# Patient Record
Sex: Female | Born: 1970
Health system: Southern US, Community
[De-identification: ages and names within clinical notes are randomized; demographics above are authoritative.]

## PROBLEM LIST (undated history)

## (undated) DIAGNOSIS — D869 Sarcoidosis, unspecified: Secondary | ICD-10-CM

## (undated) DIAGNOSIS — R911 Solitary pulmonary nodule: Secondary | ICD-10-CM

## (undated) DIAGNOSIS — I10 Essential (primary) hypertension: Secondary | ICD-10-CM

## (undated) DIAGNOSIS — N83209 Unspecified ovarian cyst, unspecified side: Secondary | ICD-10-CM

## (undated) DIAGNOSIS — I471 Supraventricular tachycardia, unspecified: Secondary | ICD-10-CM

## (undated) HISTORY — DX: Supraventricular tachycardia, unspecified: I47.10

## (undated) HISTORY — PX: LAPAROSCOPIC SALPINGOOPHERECTOMY: SUR795

## (undated) HISTORY — DX: Supraventricular tachycardia: I47.1

## (undated) HISTORY — DX: Sarcoidosis, unspecified: D86.9

## (undated) HISTORY — DX: Solitary pulmonary nodule: R91.1

## (undated) HISTORY — PX: TUBAL LIGATION: SHX77

---

## 1998-05-09 ENCOUNTER — Other Ambulatory Visit: Admission: RE | Admit: 1998-05-09 | Discharge: 1998-05-09 | Payer: Self-pay | Admitting: Obstetrics and Gynecology

## 1998-05-28 ENCOUNTER — Other Ambulatory Visit: Admission: RE | Admit: 1998-05-28 | Discharge: 1998-05-28 | Payer: Self-pay | Admitting: Obstetrics and Gynecology

## 1998-08-15 ENCOUNTER — Inpatient Hospital Stay (HOSPITAL_COMMUNITY): Admission: AD | Admit: 1998-08-15 | Discharge: 1998-08-15 | Payer: Self-pay | Admitting: Obstetrics & Gynecology

## 1998-08-22 ENCOUNTER — Other Ambulatory Visit: Admission: RE | Admit: 1998-08-22 | Discharge: 1998-08-22 | Payer: Self-pay | Admitting: Obstetrics and Gynecology

## 1999-02-22 ENCOUNTER — Inpatient Hospital Stay (HOSPITAL_COMMUNITY): Admission: AD | Admit: 1999-02-22 | Discharge: 1999-02-24 | Payer: Self-pay | Admitting: Obstetrics and Gynecology

## 1999-09-02 ENCOUNTER — Other Ambulatory Visit: Admission: RE | Admit: 1999-09-02 | Discharge: 1999-09-02 | Payer: Self-pay | Admitting: Obstetrics and Gynecology

## 2001-03-19 ENCOUNTER — Other Ambulatory Visit: Admission: RE | Admit: 2001-03-19 | Discharge: 2001-03-19 | Payer: Self-pay | Admitting: Obstetrics and Gynecology

## 2002-08-01 ENCOUNTER — Other Ambulatory Visit: Admission: RE | Admit: 2002-08-01 | Discharge: 2002-08-01 | Payer: Self-pay | Admitting: Internal Medicine

## 2002-08-10 ENCOUNTER — Encounter: Payer: Self-pay | Admitting: Internal Medicine

## 2002-08-10 ENCOUNTER — Inpatient Hospital Stay (HOSPITAL_COMMUNITY): Admission: AD | Admit: 2002-08-10 | Discharge: 2002-08-10 | Payer: Self-pay | Admitting: Obstetrics and Gynecology

## 2002-09-27 ENCOUNTER — Ambulatory Visit (HOSPITAL_COMMUNITY): Admission: RE | Admit: 2002-09-27 | Discharge: 2002-09-27 | Payer: Self-pay | Admitting: Internal Medicine

## 2002-09-27 ENCOUNTER — Encounter: Payer: Self-pay | Admitting: Internal Medicine

## 2003-06-25 ENCOUNTER — Inpatient Hospital Stay (HOSPITAL_COMMUNITY): Admission: AD | Admit: 2003-06-25 | Discharge: 2003-06-25 | Payer: Self-pay | Admitting: Family Medicine

## 2003-06-25 ENCOUNTER — Encounter: Payer: Self-pay | Admitting: Family Medicine

## 2003-07-04 ENCOUNTER — Emergency Department (HOSPITAL_COMMUNITY): Admission: EM | Admit: 2003-07-04 | Discharge: 2003-07-04 | Payer: Self-pay | Admitting: Emergency Medicine

## 2003-07-04 ENCOUNTER — Encounter: Payer: Self-pay | Admitting: Emergency Medicine

## 2003-07-18 ENCOUNTER — Encounter: Admission: RE | Admit: 2003-07-18 | Discharge: 2003-07-18 | Payer: Self-pay | Admitting: Obstetrics and Gynecology

## 2004-10-11 ENCOUNTER — Encounter: Admission: RE | Admit: 2004-10-11 | Discharge: 2004-10-11 | Payer: Self-pay | Admitting: Internal Medicine

## 2004-12-15 ENCOUNTER — Encounter: Admission: RE | Admit: 2004-12-15 | Discharge: 2004-12-15 | Payer: Self-pay | Admitting: Internal Medicine

## 2006-02-02 ENCOUNTER — Inpatient Hospital Stay (HOSPITAL_COMMUNITY): Admission: AD | Admit: 2006-02-02 | Discharge: 2006-02-02 | Payer: Self-pay | Admitting: *Deleted

## 2006-02-12 ENCOUNTER — Ambulatory Visit: Payer: Self-pay | Admitting: Obstetrics and Gynecology

## 2006-02-12 ENCOUNTER — Encounter: Payer: Self-pay | Admitting: Obstetrics and Gynecology

## 2009-11-15 ENCOUNTER — Other Ambulatory Visit: Payer: Self-pay | Admitting: Family Medicine

## 2009-11-15 ENCOUNTER — Emergency Department (HOSPITAL_COMMUNITY): Admission: EM | Admit: 2009-11-15 | Discharge: 2009-11-15 | Payer: Self-pay | Admitting: Emergency Medicine

## 2009-11-15 ENCOUNTER — Ambulatory Visit: Payer: Self-pay | Admitting: Advanced Practice Midwife

## 2009-11-18 ENCOUNTER — Emergency Department (HOSPITAL_COMMUNITY): Admission: EM | Admit: 2009-11-18 | Discharge: 2009-11-18 | Payer: Self-pay | Admitting: Emergency Medicine

## 2009-11-30 ENCOUNTER — Emergency Department (HOSPITAL_COMMUNITY): Admission: EM | Admit: 2009-11-30 | Discharge: 2009-11-30 | Payer: Self-pay | Admitting: Emergency Medicine

## 2009-12-04 ENCOUNTER — Encounter (INDEPENDENT_AMBULATORY_CARE_PROVIDER_SITE_OTHER): Payer: Self-pay | Admitting: *Deleted

## 2010-12-17 NOTE — Letter (Signed)
Summary: New Patient letter  [facility]  [address] [place], [place] [address]   Phone: [phone]  Fax: [phone]       12/04/2009 MRN: [id number]  Lauren Russo [address] [place], [place]  [address]  Dear Lauren Russo,  Welcome to the Gastroenterology Division at [facility].    You are scheduled to see Dr.  Arlyce Dice on 12-27-09 at 10AM on the 3rd floor at [facility], [address].  We ask that you try to arrive at our office 15 minutes prior to your appointment time to allow for check-in.  We would like you to complete the enclosed self-administered evaluation form prior to your visit and bring it with you on the day of your appointment.  We will review it with you.  Also, please bring a complete list of all your medications or, if you prefer, bring the medication bottles and we will list them.  Please bring your insurance card so that we may make a copy of it.  If your insurance requires a referral to see a specialist, please bring your referral form from your primary care physician.  Co-payments are due at the time of your visit and may be paid by cash, check or credit card.     Your office visit will consist of a consult with your physician (includes a physical exam), any laboratory testing he/she may order, scheduling of any necessary diagnostic testing (e.g. x-ray, ultrasound, CT-scan), and scheduling of a procedure (e.g. Endoscopy, Colonoscopy) if required.  Please allow enough time on your schedule to allow for any/all of these possibilities.    If you cannot keep your appointment, please call [phone] to cancel or reschedule prior to your appointment date.  This allows [place] the opportunity to schedule an appointment for another patient in need of care.  If you do not cancel or reschedule by 5 p.m. the business day prior to your appointment date, you will be charged a $50.00 late cancellation/no-show fee.    Thank you for choosing [facility] for your medical needs.  We appreciate the opportunity to care for you.  Please visit [place] at our website  to learn more about our practice.                     Sincerely,                                                             The Gastroenterology Division

## 2011-02-01 LAB — COMPREHENSIVE METABOLIC PANEL
ALT: 18 U/L (ref 0–35)
Albumin: 4.3 g/dL (ref 3.5–5.2)
Alkaline Phosphatase: 71 U/L (ref 39–117)
Chloride: 99 mEq/L (ref 96–112)
GFR calc Af Amer: >60 mL/min (ref >60)
Glucose, Bld: 97 mg/dL (ref 70–99)
Potassium: 4.1 mEq/L (ref 3.5–5.1)
Sodium: 134 mEq/L — ABNORMAL LOW (ref 135–145)
Total Protein: 7.6 g/dL (ref 6.0–8.3)

## 2011-02-01 LAB — DIFFERENTIAL
Eosinophils Relative: 1 % (ref 0–5)
Lymphocytes Relative: 36 % (ref 12–46)
Lymphs Abs: 2.5 10*3/uL (ref 0.7–4.0)
Monocytes Absolute: 0.5 10*3/uL (ref 0.1–1.0)

## 2011-02-01 LAB — CBC
HCT: 42.3 % (ref 36.0–46.0)
MCHC: 34.7 g/dL (ref 30.0–36.0)
MCV: 88.8 fL (ref 78.0–100.0)
Platelets: 313 10*3/uL (ref 150–400)
RDW: 13.4 % (ref 11.5–15.5)
WBC: 7.1 10*3/uL (ref 4.0–10.5)

## 2011-02-01 LAB — POCT I-STAT, CHEM 8
BUN: 19 mg/dL (ref 6–23)
Calcium, Ion: 1.09 mmol/L — ABNORMAL LOW (ref 1.12–1.32)
Creatinine, Ser: 0.7 mg/dL (ref 0.4–1.2)
Hemoglobin: 13.9 g/dL (ref 12.0–15.0)
Sodium: 138 mEq/L (ref 135–145)
TCO2: 29 mmol/L (ref 0–100)

## 2011-02-01 LAB — LIPASE, BLOOD: Lipase: 18 U/L (ref 11–59)

## 2011-02-01 LAB — D-DIMER, QUANTITATIVE: D-Dimer, Quant: <0.22 ug/mL-FEU (ref 0.00–0.48)

## 2011-02-17 LAB — WET PREP, GENITAL

## 2011-02-17 LAB — BASIC METABOLIC PANEL
BUN: 14 mg/dL (ref 6–23)
Creatinine, Ser: 0.7 mg/dL (ref 0.4–1.2)
Sodium: 138 mEq/L (ref 135–145)

## 2011-02-17 LAB — DIFFERENTIAL
Eosinophils Absolute: 0 10*3/uL (ref 0.0–0.7)
Eosinophils Relative: 1 % (ref 0–5)
Lymphocytes Relative: 28 % (ref 12–46)
Lymphs Abs: 1.8 10*3/uL (ref 0.7–4.0)
Monocytes Absolute: 0.2 10*3/uL (ref 0.1–1.0)

## 2011-02-17 LAB — CBC
HCT: 39.5 % (ref 36.0–46.0)
Hemoglobin: 13 g/dL (ref 12.0–15.0)
MCHC: 32.8 g/dL (ref 30.0–36.0)
MCV: 90.7 fL (ref 78.0–100.0)
RBC: 4.35 MIL/uL (ref 3.87–5.11)
RDW: 13.9 % (ref 11.5–15.5)

## 2011-02-17 LAB — URINALYSIS, ROUTINE W REFLEX MICROSCOPIC
Glucose, UA: NEGATIVE mg/dL
Protein, ur: NEGATIVE mg/dL
Specific Gravity, Urine: 1.015 (ref 1.005–1.030)

## 2011-04-04 NOTE — Group Therapy Note (Signed)
   NAME:  Lauren Russo, Lauren Russo NO.:  [id number]   MEDICAL RECORD NO.:  [id number]                   PATIENT TYPE:  OUT   LOCATION:  [facility]                           FACILITY:  [facility]   PHYSICIAN:  Argentina Donovan, MD                     DATE OF BIRTH:  Nov 16, 1971   DATE OF SERVICE:                                    CLINIC NOTE   GYN CLINIC NOTE:  The patient is a 40 year old gravida 4, para 2-0-2-2, with  a history of one ectopic, followed by a normal pregnancy, delivered a girl  several years ago and her other tube tied.  She has presented to the MAU, on  June 25, 2003, with a abrupt onset of left sided low abdominal pain.  A  sonogram revealed several functional cysts.  Since that time, the pain has  gone away.  She has not had episodes like this before.  She was told that  she probably had a [low] ruptured ovarian cyst.  Her previous menstrual  period, at that time, was May 27, 2003 and her last menstrual period was  July 04, 2003.  The timing of her periods essentially rules out  Mittelschmerz or corpus luteum cyst.  She had her last Pap smear in March  and all other testing done at the MAU was normal.  The patient has no  problems at this time.   She was told to return if she has a recurrence.   In her past history, several attempts to take oral contraceptive resulted in  severe shoulder and arm pain and Depo-Provera and amenorrhea for long  periods of time and weight gain; so, she is avoiding those.                                               Argentina Donovan, MD    PR/MEDQ  D:  07/18/2003  T:  07/18/2003  Job:  [id number]

## 2011-04-04 NOTE — Group Therapy Note (Signed)
NAME:  Lauren Russo, Lauren Russo NO.:  [id number]   MEDICAL RECORD NO.:  [id number]          PATIENT TYPE:  WOC   LOCATION:  [facility]                   FACILITY:  [facility]   PHYSICIAN:  Argentina Donovan, MD        DATE OF BIRTH:  01-05-1971   DATE OF SERVICE:                                    CLINIC NOTE   The patient is a 40 year old African-American, gravida 2, para 2-0-0-2 with  a child at age 35 and 47 years old who was seen in the maternity admission  with the abrupt onset of lower abdominal pain, which was diagnosed as  probable mittelschmerz.  An ultrasound was completely normal and all  examinations for pelvic infections were negative.  The patient reported that  she got metronidazole there for some type of infection although we see no  report of any infection on her chart.  We have discussed mittelschmerz and  recurrent mittelschmerz, and the possibility of treatments.  __________ the  patient's history.  She has had a tubal ligation and a salpingo-oophorectomy  for ectopic pregnancy prior to that.   PHYSICAL EXAMINATION:  external genitalia was normal.  BUS within normal  limits.  The vagina is clean and well rugated.  The cervix clean and parous.  The uterus is of normal size, shape and consistency.  The adnexa could not  be palpated because of the habitus at the patient.  The cul-de-sac was free.           ______________________________  Argentina Donovan, MD     PR/MEDQ  D:  02/12/2006  T:  02/12/2006  Job:  [id number]

## 2011-09-13 ENCOUNTER — Encounter (HOSPITAL_COMMUNITY): Payer: Self-pay

## 2011-09-13 ENCOUNTER — Inpatient Hospital Stay (HOSPITAL_COMMUNITY)
Admission: AD | Admit: 2011-09-13 | Discharge: 2011-09-13 | Disposition: A | Discharge status: Home / Self Care | Payer: Self-pay | Source: Ambulatory Visit | Attending: Family Medicine | Admitting: Family Medicine

## 2011-09-13 ENCOUNTER — Inpatient Hospital Stay (HOSPITAL_COMMUNITY): Payer: Self-pay

## 2011-09-13 DIAGNOSIS — R1032 Left lower quadrant pain: Secondary | ICD-10-CM | POA: Insufficient documentation

## 2011-09-13 DIAGNOSIS — N83201 Unspecified ovarian cyst, right side: Secondary | ICD-10-CM

## 2011-09-13 DIAGNOSIS — N83209 Unspecified ovarian cyst, unspecified side: Secondary | ICD-10-CM | POA: Insufficient documentation

## 2011-09-13 HISTORY — DX: Unspecified ovarian cyst, unspecified side: N83.209

## 2011-09-13 HISTORY — DX: Essential (primary) hypertension: I10

## 2011-09-13 LAB — URINALYSIS, ROUTINE W REFLEX MICROSCOPIC
Bilirubin Urine: NEGATIVE
Hgb urine dipstick: NEGATIVE
Nitrite: NEGATIVE
Specific Gravity, Urine: 1.02 (ref 1.005–1.030)
Urobilinogen, UA: 1 mg/dL (ref 0.0–1.0)

## 2011-09-13 LAB — WET PREP, GENITAL: Trich, Wet Prep: NONE SEEN

## 2011-09-13 MED ORDER — IBUPROFEN 800 MG PO TABS: 800.0000 mg | ORAL_TABLET | Freq: Three times a day (TID) | ORAL | Status: AC

## 2011-09-13 NOTE — Progress Notes (Signed)
Patient is here with c/o left lower quadrant sharp constant aching pain that started yesterday at 1900pm, she states that is now intense 8/10. She denies any vaginal bleeding or discharge. lmp 08/20/2011. She states that she had a tubal ligation in 2000

## 2011-09-13 NOTE — ED Provider Notes (Signed)
History     Chief Complaint  Patient presents with  . Abdominal Pain   HPI Lauren Russo 40 y.o. [id number] Has LLQ pain since 5 am on 09-12-11.  Had eased some after taking Tylenol at home before bed, but awakened her from sleep at 3 am.  Is thinking this is an ovarian cyst causing the pain. Goes to [facility] for blood pressure medication.   OB History    Grav Para Term Preterm Abortions TAB SAB Ect Mult Living   4 2 2  2 1  1  2       Past Medical History  Diagnosis Date  . Tubal pregnancy   . Hypertension   . Ovarian cyst     Past Surgical History  Procedure Date  . Tubal ligation   . Laparoscopic salpingoopherectomy     unsure of which side.     No family history on file.  History  Substance Use Topics  . Smoking status: Never Smoker   . Smokeless tobacco: Not on file  . Alcohol Use: No    Allergies: No Known Allergies  Prescriptions prior to admission  Medication Sig Dispense Refill  . acetaminophen (TYLENOL) 500 MG tablet Take 500 mg by mouth every 6 (six) hours as needed. For pain       . lisinopril-hydrochlorothiazide (PRINZIDE,ZESTORETIC) 20-12.5 MG per tablet Take 1 tablet by mouth daily.          Review of Systems  Gastrointestinal: Positive for abdominal pain. Negative for nausea and vomiting.  Genitourinary: Negative for dysuria.   Physical Exam   Blood pressure 129/78, pulse 80, temperature 98.5 F (36.9 C), temperature source Oral, resp. rate 18, last menstrual period 08/19/2011, SpO2 98.00%.  Physical Exam  Nursing note and vitals reviewed. Constitutional: She is oriented to person, place, and time. She appears well-developed and well-nourished.  HENT:  Head: Normocephalic.  Eyes: EOM are normal.  Neck: Neck supple.  GI: Soft. There is tenderness. There is no rebound and no guarding.  Genitourinary:       Speculum exam: Vulva - negative Vagina - Small amount of creamy discharge, no odor Cervix - No contact bleeding Bimanual  exam: Cervix closed Uterus - tender, unable to size due to habitus Adnexa  Tender on left, no masses bilaterally GC/Chlam, wet prep done Chaperone present for exam.  Musculoskeletal: Normal range of motion.  Neurological: She is alert and oriented to person, place, and time.  Skin: Skin is warm and dry.  Psychiatric: She has a normal mood and affect.    MAU Course  Procedures Ultrasound Clinical Data: Left lower quadrant pain. History of tubal  ligations, salpingectomy for ectopic pregnancy.  TRANSABDOMINAL AND TRANSVAGINAL ULTRASOUND OF PELVIS  Technique: Both transabdominal and transvaginal ultrasound  examinations of the pelvis were performed. Transabdominal technique  was performed for global imaging of the pelvis including uterus,  ovaries, adnexal regions, and pelvic cul-de-sac.  Comparison: CT 11/30/2009  It was necessary to proceed with endovaginal exam following the  transabdominal exam to visualize the uterus and ovarian  architecture.  Findings:  Uterus: The uterus measures 8.4 x 4.5 x 5.1 cm. Normal homogeneous  myometrial echotexture without focal mass lesion. Nabothian cysts  in the cervix.  Endometrium: Endometrial stripe thickness measures about 1.6 cm  double wall. No abnormal endometrial fluid collection.  Endometrium has a normal anatomic appearance.  Right ovary: The right ovary measures 3.9 x 2.6 x 3.9 cm. The  right ovary contains a complex  hypoechoic cystic structure  measuring 2.5 x 2.6 x 3.1 cm. No significant flow is demonstrated  within this lesion on color flow Doppler imaging. Findings likely  to represent a hemorrhagic cyst although cystic mass or  endometriosisis not excluded. Follow-up ultrasound in 8-12 weeks  is recommended for further evaluation.  Left ovary: Left ovary measures 5 x 5 x 4.6 cm contains a similar  appearing complex hypoechoic cystic structure measuring about 4.3 x  4.4 x 3.9 cm. This may represent hemorrhagic cyst although  cystic  mass or endometriosisis not excluded. Recommend follow-up  ultrasound for further evaluation.  Other findings: Small amount of free fluid in the pelvis.  IMPRESSION:  Normal appearance of the uterus and endometrium. Bilateral complex  ovarian cysts, likely to represent hemorrhagic cysts although solid  mass or endometriosis are not excluded. Follow-up ultrasound in 8-  12 weeks is recommended for further evaluation.  MDM Declines pain medication. Results for orders placed during the hospital encounter of 09/13/11 (from the past 24 hour(s))  POCT PREGNANCY, URINE     Status: Normal   Collection Time   09/13/11  3:59 AM      Component Value Range   Preg Test, Ur NEGATIVE    WET PREP, GENITAL     Status: Abnormal   Collection Time   09/13/11  4:00 AM      Component Value Range   Yeast, Wet Prep NONE SEEN  NONE SEEN    Trich, Wet Prep NONE SEEN  NONE SEEN    Clue Cells, Wet Prep MODERATE (*) NONE SEEN    WBC, Wet Prep HPF POC FEW (*) NONE SEEN   URINALYSIS, ROUTINE W REFLEX MICROSCOPIC     Status: Normal   Collection Time   09/13/11  4:07 AM      Component Value Range   Color, Urine YELLOW  YELLOW    Appearance CLEAR  CLEAR    Specific Gravity, Urine 1.020  1.005 - 1.030    pH 6.5  5.0 - 8.0    Glucose, UA NEGATIVE  NEGATIVE (mg/dL)   Hgb urine dipstick NEGATIVE  NEGATIVE    Bilirubin Urine NEGATIVE  NEGATIVE    Ketones, ur NEGATIVE  NEGATIVE (mg/dL)   Protein, ur NEGATIVE  NEGATIVE (mg/dL)   Urobilinogen, UA 1.0  0.0 - 1.0 (mg/dL)   Nitrite NEGATIVE  NEGATIVE    Leukocytes, UA NEGATIVE  NEGATIVE     Assessment and Plan  Bilateral ovarian cysts LLQ pain  Plan: Take Ibuprofen for pain Sent message to GYN clinic for follow up appointment   [name] 09/13/2011, 4:43 AM   Nolene Bernheim, NP 09/13/11 [phone]

## 2011-09-15 NOTE — ED Provider Notes (Signed)
Chart reviewed and agree with management and plan.  

## 2011-10-23 ENCOUNTER — Encounter: Payer: Self-pay | Admitting: Family

## 2011-12-11 ENCOUNTER — Other Ambulatory Visit: Payer: Self-pay | Admitting: Physician Assistant

## 2011-12-11 DIAGNOSIS — N83201 Unspecified ovarian cyst, right side: Secondary | ICD-10-CM

## 2011-12-12 ENCOUNTER — Telehealth: Payer: Self-pay | Admitting: *Deleted

## 2011-12-12 NOTE — Telephone Encounter (Signed)
Pt left message stating that she wants to change her appt to 2/15 after 3pm. Please call back.  Message routed to [employer].

## 2011-12-12 NOTE — Telephone Encounter (Signed)
Received return call from [place]. Her understanding from [place] was that she wanted to change her U/S appt.  I called pt and left message that she may change the appt by calling U/S dept- number provided. Her clinic appt for these results is scheduled on 01/07/12 @ 1415.

## 2011-12-17 ENCOUNTER — Ambulatory Visit (HOSPITAL_COMMUNITY): Payer: Self-pay

## 2012-01-02 ENCOUNTER — Ambulatory Visit (HOSPITAL_COMMUNITY)
Admission: RE | Admit: 2012-01-02 | Discharge: 2012-01-02 | Disposition: A | Discharge status: Home / Self Care | Payer: Self-pay | Source: Ambulatory Visit | Attending: Physician Assistant | Admitting: Physician Assistant

## 2012-01-02 DIAGNOSIS — N83209 Unspecified ovarian cyst, unspecified side: Secondary | ICD-10-CM | POA: Insufficient documentation

## 2012-01-02 DIAGNOSIS — N83201 Unspecified ovarian cyst, right side: Secondary | ICD-10-CM

## 2012-01-05 ENCOUNTER — Ambulatory Visit: Payer: Self-pay | Admitting: Family Medicine

## 2012-01-07 ENCOUNTER — Ambulatory Visit: Payer: Self-pay | Admitting: Obstetrics and Gynecology

## 2012-03-08 ENCOUNTER — Encounter: Payer: Self-pay | Admitting: Obstetrics and Gynecology

## 2012-10-30 ENCOUNTER — Encounter (HOSPITAL_COMMUNITY): Payer: Self-pay | Admitting: Emergency Medicine

## 2012-10-30 ENCOUNTER — Emergency Department (HOSPITAL_COMMUNITY): Payer: BC Managed Care – PPO

## 2012-10-30 ENCOUNTER — Inpatient Hospital Stay (HOSPITAL_COMMUNITY)
Admission: EM | Admit: 2012-10-30 | Discharge: 2012-11-05 | DRG: 092 | Disposition: A | Discharge status: Home / Self Care | Payer: BC Managed Care – PPO | Attending: Internal Medicine | Admitting: Internal Medicine

## 2012-10-30 DIAGNOSIS — R599 Enlarged lymph nodes, unspecified: Secondary | ICD-10-CM | POA: Diagnosis present

## 2012-10-30 DIAGNOSIS — R079 Chest pain, unspecified: Secondary | ICD-10-CM | POA: Diagnosis present

## 2012-10-30 DIAGNOSIS — R590 Localized enlarged lymph nodes: Secondary | ICD-10-CM

## 2012-10-30 DIAGNOSIS — Z803 Family history of malignant neoplasm of breast: Secondary | ICD-10-CM

## 2012-10-30 DIAGNOSIS — D869 Sarcoidosis, unspecified: Principal | ICD-10-CM | POA: Diagnosis present

## 2012-10-30 DIAGNOSIS — J99 Respiratory disorders in diseases classified elsewhere: Secondary | ICD-10-CM | POA: Diagnosis present

## 2012-10-30 DIAGNOSIS — M542 Cervicalgia: Secondary | ICD-10-CM | POA: Diagnosis present

## 2012-10-30 DIAGNOSIS — R739 Hyperglycemia, unspecified: Secondary | ICD-10-CM

## 2012-10-30 DIAGNOSIS — R071 Chest pain on breathing: Secondary | ICD-10-CM | POA: Diagnosis present

## 2012-10-30 DIAGNOSIS — R7309 Other abnormal glucose: Secondary | ICD-10-CM | POA: Diagnosis present

## 2012-10-30 DIAGNOSIS — D649 Anemia, unspecified: Secondary | ICD-10-CM | POA: Diagnosis present

## 2012-10-30 DIAGNOSIS — R911 Solitary pulmonary nodule: Secondary | ICD-10-CM | POA: Diagnosis present

## 2012-10-30 DIAGNOSIS — F17200 Nicotine dependence, unspecified, uncomplicated: Secondary | ICD-10-CM | POA: Diagnosis present

## 2012-10-30 DIAGNOSIS — R918 Other nonspecific abnormal finding of lung field: Secondary | ICD-10-CM

## 2012-10-30 LAB — CBC
MCH: 28.9 pg (ref 26.0–34.0)
MCHC: 33.5 g/dL (ref 30.0–36.0)
MCV: 86.3 fL (ref 78.0–100.0)
Platelets: 306 10*3/uL (ref 150–400)
RDW: 13.9 % (ref 11.5–15.5)

## 2012-10-30 LAB — BASIC METABOLIC PANEL
BUN: 16 mg/dL (ref 6–23)
Calcium: 9.5 mg/dL (ref 8.4–10.5)
Creatinine, Ser: 0.85 mg/dL (ref 0.50–1.10)
GFR calc Af Amer: >90 mL/min (ref >90)

## 2012-10-30 LAB — TROPONIN I: Troponin I: <0.3 ng/mL (ref <0.30)

## 2012-10-30 MED ORDER — NITROGLYCERIN 0.4 MG SL SUBL: 0.4000 mg | SUBLINGUAL_TABLET | SUBLINGUAL | Status: DC | PRN

## 2012-10-30 MED ORDER — ALBUTEROL SULFATE (5 MG/ML) 0.5% IN NEBU
5.0000 mg | INHALATION_SOLUTION | Freq: Once | RESPIRATORY_TRACT | Status: AC
  Administered 2012-10-30: 5 mg via RESPIRATORY_TRACT
  Filled 2012-10-30: qty 1

## 2012-10-30 MED ORDER — ASPIRIN 325 MG PO TABS
325.0000 mg | ORAL_TABLET | ORAL | Status: AC
  Administered 2012-10-30: 325 mg via ORAL
  Filled 2012-10-30: qty 1

## 2012-10-30 MED ORDER — KETOROLAC TROMETHAMINE 30 MG/ML IJ SOLN
15.0000 mg | Freq: Once | INTRAMUSCULAR | Status: DC
  Filled 2012-10-30: qty 1

## 2012-10-30 MED ORDER — IPRATROPIUM BROMIDE 0.02 % IN SOLN
0.5000 mg | Freq: Once | RESPIRATORY_TRACT | Status: AC
  Administered 2012-10-30: 0.5 mg via RESPIRATORY_TRACT
  Filled 2012-10-30: qty 2.5

## 2012-10-30 NOTE — ED Notes (Signed)
Pt sts she had a sharp chest pain that started this evening and became SOB pt sts pain is worse when pt moves left arm. No LOC.

## 2012-10-30 NOTE — ED Notes (Signed)
MD at bedside. 

## 2012-10-30 NOTE — ED Provider Notes (Addendum)
History     CSN: [id number]  Arrival date & time 10/30/12  2301   First MD Initiated Contact with Patient 10/30/12 2315      Chief Complaint  Patient presents with  . Chest Pain    (Consider location/radiation/quality/duration/timing/severity/associated sxs/prior treatment) HPI This is a 41 year old female with a five-day history of cough and shortness of breath. She was seen by her primary care physician who placed her on clindamycin for bronchitis. He also placed her on Flagyl for bacterial vaginosis. Her shortness of breath briefly improved but then worsened later in the week. She saw him again yesterday and he placed her on Norco thinking that it would help suppress her cough. She states it mostly made her sleepy. He did not place her on a second antibiotic. He did give her Diflucan for symptoms of yeast infection.  The patient is here this evening with chest pain that is developed along with the persistent shortness of breath. The pain is located in her shoulders bilaterally as well as her back. The pain is worse with movement or deep breathing. It is moderate to severe. It is dull in areas and sharp in the left upper chest. There's been no associated diaphoresis, nausea or vomiting. She states she is having difficulty drawing a deep breath. Her shortness of breath is worse with exertion and improved with rest. She has not been given a bronchodilator. She has had a low-grade temperature noted to be 100.1 on arrival here.  Past Medical History  Diagnosis Date  . Tubal pregnancy   . Hypertension   . Ovarian cyst     Past Surgical History  Procedure Date  . Tubal ligation   . Laparoscopic salpingoopherectomy     unsure of which side.     No family history on file.  History  Substance Use Topics  . Smoking status: Passive Smoke Exposure - Never Smoker  . Smokeless tobacco: Not on file  . Alcohol Use: Yes    OB History    Grav Para Term Preterm Abortions TAB SAB Ect Mult  Living   4 2 2  2 1  1  2       Review of Systems  All other systems reviewed and are negative.    Allergies  Review of patient's allergies indicates no known allergies.  Home Medications   Current Outpatient Rx  Name  Route  Sig  Dispense  Refill  . HYDROCODONE-ACETAMINOPHEN 5-325 MG PO TABS   Oral   Take 1-2 tablets by mouth every 6 (six) hours as needed. For pain         . LISINOPRIL-HYDROCHLOROTHIAZIDE 20-12.5 MG PO TABS   Oral   Take 1 tablet by mouth daily.           Marland Kitchen VITAMIN C 500 MG PO TABS   Oral   Take 500 mg by mouth daily.           BP 119/71  Pulse 109  Temp 99.7 F (37.6 C) (Oral)  Resp 17  Ht 4\' 11"  (1.499 m)  Wt 163 lb (73.936 kg)  BMI 32.92 kg/m2  SpO2 100%  LMP 10/16/2012  Physical Exam General: Well-developed, well-nourished female in no acute distress; appearance consistent with age of record HENT: normocephalic, atraumatic Eyes: pupils equal round and reactive to light; extraocular muscles intact Neck: supple Heart: regular rate and rhythm Lungs: Shallow respirations; decreased air movement without frank wheezing; coughing on attempted deep breaths Chest: Nontender Abdomen: soft; nondistended; nontender; bowel  sounds present Extremities: No deformity; full range of motion; pulses normal; no edema Neurologic: Awake, alert and oriented; motor function intact in all extremities and symmetric; no facial droop Skin: Warm and dry Psychiatric: Anxious    ED Course  Procedures (including critical care time)     MDM   Nursing notes and vitals signs, including pulse oximetry, reviewed.  Summary of this visit's results, reviewed by myself:  Labs:  Results for orders placed during the hospital encounter of 10/30/12 (from the past 24 hour(s))  CBC     Status: Abnormal   Collection Time   10/30/12 11:14 PM      Component Value Range   WBC 9.8  4.0 - 10.5 K/uL   RBC 4.08  3.87 - 5.11 MIL/uL   Hemoglobin 11.8 (*) 12.0 - 15.0  g/dL   HCT 09.8 (*) 11.9 - 14.7 %   MCV 86.3  78.0 - 100.0 fL   MCH 28.9  26.0 - 34.0 pg   MCHC 33.5  30.0 - 36.0 g/dL   RDW 82.9  56.2 - 13.0 %   Platelets 306  150 - 400 K/uL  BASIC METABOLIC PANEL     Status: Abnormal   Collection Time   10/30/12 11:14 PM      Component Value Range   Sodium 132 (*) 135 - 145 mEq/L   Potassium 3.7  3.5 - 5.1 mEq/L   Chloride 95 (*) 96 - 112 mEq/L   CO2 26  19 - 32 mEq/L   Glucose, Bld 109 (*) 70 - 99 mg/dL   BUN 16  6 - 23 mg/dL   Creatinine, Ser 8.65  0.50 - 1.10 mg/dL   Calcium 9.5  8.4 - 78.4 mg/dL   GFR calc non Af Amer 85 (*) >90 mL/min   GFR calc Af Amer >90  >90 mL/min  TROPONIN I     Status: Normal   Collection Time   10/30/12 11:14 PM      Component Value Range   Troponin I <0.30  <0.30 ng/mL  DIFFERENTIAL     Status: Abnormal   Collection Time   10/30/12 11:14 PM      Component Value Range   Neutrophils Relative 71  43 - 77 %   Neutro Abs 6.9  1.7 - 7.7 K/uL   Lymphocytes Relative 17  12 - 46 %   Lymphs Abs 1.7  0.7 - 4.0 K/uL   Monocytes Relative 11  3 - 12 %   Monocytes Absolute 1.1 (*) 0.1 - 1.0 K/uL   Eosinophils Relative 1  0 - 5 %   Eosinophils Absolute 0.1  0.0 - 0.7 K/uL   Basophils Relative 0  0 - 1 %   Basophils Absolute 0.0  0.0 - 0.1 K/uL    Imaging Studies: Dg Chest 2 View  10/31/2012  *RADIOLOGY REPORT*  Clinical Data: Chest pain, shortness of breath, wheezing, hypertension  CHEST - 2 VIEW  Comparison: 11/30/2009  Findings: Normal heart size. Minimal enlargement of the pulmonary hila especially left. Nodular appearing of poorly defined opacities in the lungs bilaterally, new. Minimal subsegmental atelectasis right base. No pleural effusion or pneumothorax. Bones unremarkable.  IMPRESSION: Mild bilateral hilar enlargement raising question of adenopathy, greater on left. Right basilar atelectasis. New patchy poorly defined somewhat nodular-appearing opacities in both lungs. These could represent multifocal  pneumonia, septic emboli, or poorly defined pulmonary nodules. Further evaluation by CT chest with IV contrast recommended to exclude pulmonary nodules.   Original  Report Authenticated By: Ulyses Southward, M.D.    Ct Chest W Contrast  10/31/2012  *RADIOLOGY REPORT*  Clinical Data:  Abnormal chest x-ray, chest pain, shortness of breath, wheezing  CT CHEST WITH CONTRAST  Technique:  Multidetector CT imaging of the chest was performed following the standard protocol during bolus administration of intravenous contrast. Sagittal and coronal MPR images reconstructed from axial data set.  Contrast: OMNIPAQUE IOHEXOL 300 MG/ML  SOLN  Comparison: Chest radiograph 10/30/2012  Findings: Thoracic vascular structures patent on non dedicated exam. Visualized portion of upper abdomen normal appearance. Mediastinal and hilar adenopathy. Enlarged subcarinal node 3.5 x 1.6 cm image 23. Large right paratracheal node 1.9 x 1.1 cm image 14. Multiple bilateral solid appearing masses are seen in both lungs. Largest lesion right lower lobe approximately 4.0 x 3.0 cm image 36. Left lung base mass 3.3 x 2.6 cm image 36. Right upper lobe lesion image 19 2.8 x 2.0 cm demonstrates air bronchograms. Additional peripheral based opacities are identified, somewhat plaque-like with poorly defined margins, particularly in the left chest. No pleural effusion or pneumothorax. Few tiny pulmonary nodules noted. No osseous lesions.  IMPRESSION: Mediastinal or hilar adenopathy as above. Bilateral lung nodules/masses up to 4.0 cm greatest size, single of which in the right upper lobe shows air bronchogram. With a normal white count, favor tumor over septic emboli or multi focal infection, or potentially lymphoma. Tissue diagnosis recommended. Recommend breast exam and mammography follow-up as well.   Original Report Authenticated By: Ulyses Southward, M.D.       EKG Interpretation:  Date & Time: 10/30/2012 11:07 PM  Rate: 97  Rhythm: normal sinus  rhythm  QRS Axis: normal  Intervals: normal  ST/T Wave abnormalities: nonspecific T wave changes  Conduction Disutrbances:none  Narrative Interpretation: left atrial hypertrophy  Old EKG Reviewed: unchanged  3:02 AM Patient advised of CT findings which includes a differential diagnosis of septic emboli and malignancy. Breast exam was performed which revealed symmetric breasts without palpable mass; no axillary lymphadenopathy palpated.  Hospitalist to admit.       Hanley Seamen, MD 10/31/12 0200  Hanley Seamen, MD 10/31/12 [phone]

## 2012-10-31 ENCOUNTER — Encounter (HOSPITAL_COMMUNITY): Payer: Self-pay | Admitting: *Deleted

## 2012-10-31 ENCOUNTER — Emergency Department (HOSPITAL_COMMUNITY): Payer: BC Managed Care – PPO

## 2012-10-31 DIAGNOSIS — R918 Other nonspecific abnormal finding of lung field: Secondary | ICD-10-CM

## 2012-10-31 DIAGNOSIS — R590 Localized enlarged lymph nodes: Secondary | ICD-10-CM | POA: Diagnosis present

## 2012-10-31 DIAGNOSIS — R7309 Other abnormal glucose: Secondary | ICD-10-CM

## 2012-10-31 DIAGNOSIS — R739 Hyperglycemia, unspecified: Secondary | ICD-10-CM

## 2012-10-31 DIAGNOSIS — D649 Anemia, unspecified: Secondary | ICD-10-CM

## 2012-10-31 DIAGNOSIS — M79609 Pain in unspecified limb: Secondary | ICD-10-CM

## 2012-10-31 DIAGNOSIS — R079 Chest pain, unspecified: Secondary | ICD-10-CM | POA: Diagnosis present

## 2012-10-31 DIAGNOSIS — R599 Enlarged lymph nodes, unspecified: Secondary | ICD-10-CM

## 2012-10-31 LAB — C-REACTIVE PROTEIN: CRP: 7.3 mg/dL — ABNORMAL HIGH (ref <0.60)

## 2012-10-31 LAB — CBC
HCT: 32 % — ABNORMAL LOW (ref 36.0–46.0)
MCH: 29.1 pg (ref 26.0–34.0)
MCHC: 33.8 g/dL (ref 30.0–36.0)
MCV: 86.3 fL (ref 78.0–100.0)
Platelets: 277 10*3/uL (ref 150–400)
RDW: 13.9 % (ref 11.5–15.5)
WBC: 9.6 10*3/uL (ref 4.0–10.5)

## 2012-10-31 LAB — BASIC METABOLIC PANEL
BUN: 11 mg/dL (ref 6–23)
Calcium: 8.8 mg/dL (ref 8.4–10.5)
Chloride: 99 mEq/L (ref 96–112)
Creatinine, Ser: 0.69 mg/dL (ref 0.50–1.10)
GFR calc Af Amer: >90 mL/min (ref >90)
GFR calc non Af Amer: >90 mL/min (ref >90)

## 2012-10-31 LAB — DIFFERENTIAL
Basophils Absolute: 0 10*3/uL (ref 0.0–0.1)
Eosinophils Absolute: 0.1 10*3/uL (ref 0.0–0.7)
Eosinophils Relative: 1 % (ref 0–5)
Monocytes Absolute: 1.1 10*3/uL — ABNORMAL HIGH (ref 0.1–1.0)

## 2012-10-31 LAB — HEPATIC FUNCTION PANEL
AST: 23 U/L (ref 0–37)
Alkaline Phosphatase: 71 U/L (ref 39–117)
Bilirubin, Direct: <0.1 mg/dL (ref 0.0–0.3)
Total Bilirubin: 0.2 mg/dL — ABNORMAL LOW (ref 0.3–1.2)

## 2012-10-31 LAB — PROCALCITONIN: Procalcitonin: <0.1 ng/mL

## 2012-10-31 LAB — STREP PNEUMONIAE URINARY ANTIGEN: Strep Pneumo Urinary Antigen: NEGATIVE

## 2012-10-31 LAB — PREGNANCY, URINE: Preg Test, Ur: NEGATIVE

## 2012-10-31 LAB — ANGIOTENSIN CONVERTING ENZYME: Angiotensin-Converting Enzyme: 5 U/L — ABNORMAL LOW (ref 8–52)

## 2012-10-31 LAB — AFP TUMOR MARKER: AFP-Tumor Marker: <1.3 ng/mL (ref 0.0–8.0)

## 2012-10-31 LAB — LACTIC ACID, PLASMA: Lactic Acid, Venous: 0.9 mmol/L (ref 0.5–2.2)

## 2012-10-31 MED ORDER — IOHEXOL 300 MG/ML  SOLN
100.0000 mL | Freq: Once | INTRAMUSCULAR | Status: AC | PRN
  Administered 2012-10-31: 100 mL via INTRAVENOUS

## 2012-10-31 MED ORDER — SODIUM CHLORIDE 0.9 % IJ SOLN
3.0000 mL | Freq: Two times a day (BID) | INTRAMUSCULAR | Status: DC
  Administered 2012-11-02 – 2012-11-04 (×5): 3 mL via INTRAVENOUS

## 2012-10-31 MED ORDER — ONDANSETRON HCL 4 MG/2ML IJ SOLN
4.0000 mg | Freq: Four times a day (QID) | INTRAMUSCULAR | Status: DC | PRN
  Administered 2012-10-31 – 2012-11-02 (×2): 4 mg via INTRAVENOUS
  Filled 2012-10-31 (×2): qty 2

## 2012-10-31 MED ORDER — HEPARIN SODIUM (PORCINE) 5000 UNIT/ML IJ SOLN
5000.0000 [IU] | Freq: Three times a day (TID) | INTRAMUSCULAR | Status: DC
  Administered 2012-10-31 – 2012-11-01 (×4): 5000 [IU] via SUBCUTANEOUS
  Filled 2012-10-31 (×7): qty 1

## 2012-10-31 MED ORDER — LISINOPRIL-HYDROCHLOROTHIAZIDE 20-12.5 MG PO TABS: 1.0000 | ORAL_TABLET | Freq: Every day | ORAL | Status: DC

## 2012-10-31 MED ORDER — DEXTROSE 5 % IV SOLN
500.0000 mg | Freq: Once | INTRAVENOUS | Status: AC
  Administered 2012-10-31: 500 mg via INTRAVENOUS
  Filled 2012-10-31: qty 500

## 2012-10-31 MED ORDER — LISINOPRIL 20 MG PO TABS
20.0000 mg | ORAL_TABLET | Freq: Every day | ORAL | Status: DC
  Administered 2012-10-31 – 2012-11-04 (×5): 20 mg via ORAL
  Filled 2012-10-31 (×5): qty 1

## 2012-10-31 MED ORDER — DEXTROSE 5 % IV SOLN
1.0000 g | Freq: Once | INTRAVENOUS | Status: AC
  Administered 2012-10-31: 1 g via INTRAVENOUS
  Filled 2012-10-31: qty 10

## 2012-10-31 MED ORDER — HYDROCODONE-ACETAMINOPHEN 5-325 MG PO TABS
1.0000 | ORAL_TABLET | Freq: Four times a day (QID) | ORAL | Status: DC | PRN
  Administered 2012-10-31 (×2): 1 via ORAL
  Administered 2012-10-31: 2 via ORAL
  Administered 2012-11-01 – 2012-11-05 (×8): 1 via ORAL
  Filled 2012-10-31 (×8): qty 1
  Filled 2012-10-31: qty 2
  Filled 2012-10-31 (×2): qty 1
  Filled 2012-10-31: qty 2

## 2012-10-31 MED ORDER — VITAMIN C 500 MG PO TABS
500.0000 mg | ORAL_TABLET | Freq: Every day | ORAL | Status: DC
  Administered 2012-10-31 – 2012-11-05 (×6): 500 mg via ORAL
  Filled 2012-10-31 (×6): qty 1

## 2012-10-31 MED ORDER — ACETAMINOPHEN 325 MG PO TABS: 650.0000 mg | ORAL_TABLET | Freq: Four times a day (QID) | ORAL | Status: DC | PRN

## 2012-10-31 MED ORDER — HYDROCHLOROTHIAZIDE 12.5 MG PO CAPS
12.5000 mg | ORAL_CAPSULE | Freq: Every day | ORAL | Status: DC
  Administered 2012-10-31 – 2012-11-05 (×6): 12.5 mg via ORAL
  Filled 2012-10-31 (×6): qty 1

## 2012-10-31 MED ORDER — FENTANYL CITRATE 0.05 MG/ML IJ SOLN
100.0000 ug | Freq: Once | INTRAMUSCULAR | Status: AC
  Administered 2012-10-31: 100 ug via INTRAVENOUS
  Filled 2012-10-31: qty 2

## 2012-10-31 MED ORDER — SODIUM CHLORIDE 0.9 % IV SOLN
INTRAVENOUS | Status: DC
  Administered 2012-10-31: 04:00:00 via INTRAVENOUS

## 2012-10-31 MED ORDER — SODIUM CHLORIDE 0.9 % IV SOLN
INTRAVENOUS | Status: AC
  Administered 2012-10-31 – 2012-11-01 (×2): via INTRAVENOUS

## 2012-10-31 NOTE — Progress Notes (Signed)
*  PRELIMINARY RESULTS* Echocardiogram 2D Echocardiogram has been performed.  Lyris Hitchman 10/31/2012, 10:46 AM

## 2012-10-31 NOTE — Consult Note (Addendum)
PULMONARY  / CRITICAL CARE MEDICINE  Name: Lauren Russo MRN: [id number] DOB: 07/16/1971    LOS: 1 Date of admit : 10/30/2012 11:02 PM   REFERRING PROVIDER:  Dr Doran Stabler  CHIEF COMPLAINT:  Dyspnea, pulm nodules  HPI Lauren Russo is a 41 y.o. female who presented initially with right sided neck pain posteriorly. She quickly was overwhelmed with  cough and SOB. Symptoms onset was acute and 5 days prior to admission. Dyspnea rated as severe and progressive. She was very dyspneic with exerrion and relieved by rest. Her PCP saw her and put her on clinda for bronchitis and flagyl for bacterial vaginosis and also a short course of prednisone.  SOB briefly improved but then worsened later in the week. 3 days prior to admission she noticed right quads and hamstring tightness and myalgia that has persisteed. Then on the evening prior to admission she started noticingn bilateral chest pain  Both anterior ly and posteriorly in her shoulders as well. This was severe and worsened with inspiration.   At arrival in  ER on 10/30/2012 11:02 PM  she did have a low grade temp of 100.1 but no other SIRS criteria and WBC was only 9.8. The ED began empiric treatment for PNA with rocephin and azithromycin and ordered blood cultures. Unfortunately the patients CT chest when obtained demonstrated multiple bilateral masses and nodules as well as mild hilar/mediastinal lymphadenopathy.   Of note, 2004 CT chest done for chest pain: ruled out PE and had clear infitlrates. In 2011 abd ct (for ovarian cyst rupture) lung cut at base was normal. Clearly current findings are new snice 2011  Also of note a) patient husband died in her arms few monthsa ago from MI and she is very stressed; b ) she also lost her step father recently   LINES / TUBES: None  CULTURES: Results for orders placed during the hospital encounter of 09/13/11  WET PREP, GENITAL     Status: Abnormal   Collection Time   09/13/11  4:00 AM       Component Value Range Status Comment   Yeast Wet Prep HPF POC NONE SEEN  NONE SEEN Final    Trich, Wet Prep NONE SEEN  NONE SEEN Final    Clue Cells Wet Prep HPF POC MODERATE (*) NONE SEEN Final    WBC, Wet Prep HPF POC FEW (*) NONE SEEN Final MANY BACTERIA SEEN     ANTIBIOTICS: Anti-infectives     Start     Dose/Rate Route Frequency Ordered Stop   10/31/12 0030   cefTRIAXone (ROCEPHIN) 1 g in dextrose 5 % 50 mL IVPB        1 g 100 mL/hr over 30 Minutes Intravenous  Once 10/31/12 0023 10/31/12 0205   10/31/12 0030   azithromycin (ZITHROMAX) 500 mg in dextrose 5 % 250 mL IVPB        500 mg 250 mL/hr over 60 Minutes Intravenous  Once 10/31/12 0023 10/31/12 0205         Urine leg 12/15 Urine strep 12/15 No results found for this basename: PROCALCITON:5 in the last 168 hours   SIGNIFICANT EVENTS:  Admit 10/30/2012 11:02 PM   PAST MEDICAL HISTORY :  Past Medical History  Diagnosis Date  . Tubal pregnancy   . Hypertension   . Ovarian cyst    Past Surgical History  Procedure Date  . Tubal ligation   . Laparoscopic salpingoopherectomy     unsure of which side.  Prior to Admission medications   Medication Sig Start Date End Date Taking? Authorizing Provider  HYDROcodone-acetaminophen (NORCO/VICODIN) 5-325 MG per tablet Take 1-2 tablets by mouth every 6 (six) hours as needed. For pain   Yes Historical Provider, MD  lisinopril-hydrochlorothiazide (PRINZIDE,ZESTORETIC) 20-12.5 MG per tablet Take 1 tablet by mouth daily.     Yes Historical Provider, MD  vitamin C (ASCORBIC ACID) 500 MG tablet Take 500 mg by mouth daily.   Yes Historical Provider, MD   No Known Allergies  FAMILY HISTORY:  History reviewed. No pertinent family history. SOCIAL HISTORY:  reports that she has been passively smoking.  She does not have any smokeless tobacco history on file. She reports that she drinks about 1.2 ounces of alcohol per week. She reports that she does not use illicit  drugs.  REVIEW OF SYSTEMS:   Constitutional: Negative for fever, chills, weight loss, malaise/fatigue and diaphoresis. recent weight loss due to death in family HENT: Negative for hearing loss, ear pain, nosebleeds, congestion, sore throat, neck pain, tinnitus and ear discharge.   Eyes: Negative for blurred vision, double vision, photophobia, pain, discharge and redness.  Respiratory: postivie for Dyspnea, chest pain, cough,  Cardiovascular: Negative for chest pain, palpitations, orthopnea, claudication, leg swelling and PND.  Gastrointestinal: Negative for heartburn, nausea, vomiting, abdominal pain, diarrhea, constipation, blood in stool and melena.  Genitourinary: Negative for dysuria, urgency, frequency, hematuria and flank pain.  Musculoskeletal: Rt neck pain and rt lowe extremity pain Skin: Negative for itching and rash.  Neurological: Negative for dizziness, tingling, tremors, sensory change, speech change, focal weakness, seizures, loss of consciousness, weakness and headaches.  Endo/Heme/Allergies: Negative for environmental allergies and polydipsia. Does not bruise/bleed easily.   VITAL SIGNS: Temp:  [98.4 F (36.9 C)-100.1 F (37.8 C)] 98.4 F (36.9 C) (12/15 0332) Pulse Rate:  [94-116] 94  (12/15 0332) Resp:  [16-20] 17  (12/15 0100) BP: (117-133)/(71-87) 117/82 mmHg (12/15 0911) SpO2:  [98 %-100 %] 100 % (12/15 0332) Weight:  [73.936 kg (163 lb)-74.1 kg (163 lb 5.8 oz)] 74.1 kg (163 lb 5.8 oz) (12/15 0332)  PHYSICAL EXAMINATION:  BP 117/82  Pulse 94  Temp 98.4 F (36.9 C) (Oral)  Resp 17  Ht 4\' 11"  (1.499 m)  Wt 74.1 kg (163 lb 5.8 oz)  BMI 32.99 kg/m2  SpO2 100%  LMP 10/16/2012  General Appearance:    Alert, cooperative, no distress, appears stated age  Head:    Normocephalic, without obvious abnormality, atraumatic  Eyes:    PERRL, conjunctiva/corneas clear, EOM's intact, fundi    benign, both eyes  Ears:    Normal TM's and external ear canals, both ears   Nose:   Nares normal, septum midline, mucosa normal, no drainage    or sinus tenderness  Throat:   Lips, mucosa, and tongue normal; teeth and gums normal  Neck:   Supple, symmetrical, trachea midline, no adenopathy;    thyroid:  no enlargement/tenderness/nodules; no carotid   bruit or JVD  Back:     Symmetric, no curvature, ROM normal, no CVA tenderness  Lungs:     Clear to auscultation bilaterally, respirations unlabored  Chest Wall:    No tenderness or deformity   Heart:    Regular rate and rhythm, S1 and S2 normal, no murmur, rub   or gallop  Breast Exam:  NORMAL PER REPORT  Abdomen:     Soft, non-tender, bowel sounds active all four quadrants,    no masses, no organomegaly  Genitalia:  Not  examined  Rectal:    Normal tone, normal prostate, no masses or tenderness;   guaiac negative stool  Extremities:   Extremities normal, atraumatic, no cyanosis or edema  Pulses:   2+ and symmetric all extremities  Skin:   Skin color, texture, turgor normal, no rashes or lesions  Lymph nodes:   Cervical, supraclavicular, and axillary nodes normal  Neurologic:   CNII-XII intact, normal strength, sensation and reflexes    throughout       Lab 10/31/12 0504 10/30/12 2314  NA 136 132*  K 3.5 3.7  CL 99 95*  CO2 26 26  BUN 11 16  CREATININE 0.69 0.85  GLUCOSE 157* 109*    Lab 10/31/12 0504 10/30/12 2314  HGB 10.8* 11.8*  HCT 32.0* 35.2*  WBC 9.6 9.8  PLT 277 306   Dg Chest 2 View  10/31/2012  *RADIOLOGY REPORT*  Clinical Data: Chest pain, shortness of breath, wheezing, hypertension  CHEST - 2 VIEW  Comparison: 11/30/2009  Findings: Normal heart size. Minimal enlargement of the pulmonary hila especially left. Nodular appearing of poorly defined opacities in the lungs bilaterally, new. Minimal subsegmental atelectasis right base. No pleural effusion or pneumothorax. Bones unremarkable.  IMPRESSION: Mild bilateral hilar enlargement raising question of adenopathy, greater on left. Right  basilar atelectasis. New patchy poorly defined somewhat nodular-appearing opacities in both lungs. These could represent multifocal pneumonia, septic emboli, or poorly defined pulmonary nodules. Further evaluation by CT chest with IV contrast recommended to exclude pulmonary nodules.   Original Report Authenticated By: Ulyses Southward, M.D.    Ct Chest W Contrast  10/31/2012  *RADIOLOGY REPORT*  Clinical Data:  Abnormal chest x-ray, chest pain, shortness of breath, wheezing  CT CHEST WITH CONTRAST  Technique:  Multidetector CT imaging of the chest was performed following the standard protocol during bolus administration of intravenous contrast. Sagittal and coronal MPR images reconstructed from axial data set.  Contrast: OMNIPAQUE IOHEXOL 300 MG/ML  SOLN  Comparison: Chest radiograph 10/30/2012  Findings: Thoracic vascular structures patent on non dedicated exam. Visualized portion of upper abdomen normal appearance. Mediastinal and hilar adenopathy. Enlarged subcarinal node 3.5 x 1.6 cm image 23. Large right paratracheal node 1.9 x 1.1 cm image 14. Multiple bilateral solid appearing masses are seen in both lungs. Largest lesion right lower lobe approximately 4.0 x 3.0 cm image 36. Left lung base mass 3.3 x 2.6 cm image 36. Right upper lobe lesion image 19 2.8 x 2.0 cm demonstrates air bronchograms. Additional peripheral based opacities are identified, somewhat plaque-like with poorly defined margins, particularly in the left chest. No pleural effusion or pneumothorax. Few tiny pulmonary nodules noted. No osseous lesions.  IMPRESSION: Mediastinal or hilar adenopathy as above. Bilateral lung nodules/masses up to 4.0 cm greatest size, single of which in the right upper lobe shows air bronchogram. With a normal white count, favor tumor over septic emboli or multi focal infection, or potentially lymphoma. Tissue diagnosis recommended. Recommend breast exam and mammography follow-up as well.   Original Report  Authenticated By: Ulyses Southward, M.D.        ASSESSMENT / PLAN:  Patient Active Hospital Problem List: Multiple pulmonary nodules (10/31/2012)   Mediastinal adenopathy (10/31/2012)     Hilar adenopathy (10/31/2012)      Assessment and Plan   DDx is broad.   - Sarcoid  - probably comes to mind first due to age, and ethnicity but presentation and nature of nodules argues against it but certainly possible. If sarcoid, there is  no evidence of sarcoid elsewhere. Will check ACE level    - Septic Embolii - radiologic appearance fits i with this but clinically no evidence of infection. However, she had neck pain and sore throat symptoms preceding respiratory dyspnea/cough symptoms. So, ? She has LEMIERRE SYNDROME. Current lack of fever and normal wbc can be explaiend by partial Rx with clinda as outpatient. QIH:[id number] Echo. Check PCT. Check LFT. I have d/w Dr Irene Limbo and he will d/w radiology about getting [place] jugular or getting CT angio neck to look for thrombophlebitis of neck veins    - Mets -  Possible. But no evidence of cancer on exam. Will check AFP and HCG. Rest of cancer workup per [facility]     - Autoimmune disease (Wegner, Lupus) - possible. Check autoimmune panel -   Plan:  - See above - recommend do above echo and blood tests and rule out Lemmiere - based on that results, decide on biopsy or not. If biopsy either through bronch EBUS +/- ENB or CT guided TTNA (there are peripheral large lesions that are amenable to CT guided bx and shoould help reduce false negative results)       D/w Dr Irene Limbo and patient   Dr. Kalman Shan, M.D., [facility].C.P Pulmonary and Critical Care Medicine Staff Physician [facility] Pager: [phone], If no answer or between  15:00h - 7:00h: call [phone]  10/31/2012 2:04 PM       Pulmonary and Critical Care Medicine [facility] Pager: [phone]  10/31/2012, 1:06 PM

## 2012-10-31 NOTE — H&P (Signed)
[facility] History and Physical  Lauren Russo ZOX:[id number] DOB: September 06, 1971 DOA: 10/30/2012  Referring physician: ED PCP: No primary provider on file.  Specialists: None  Chief Complaint: Cough and SOB  HPI: Lauren Russo is a 41 y.o. female who presents with cough and SOB.  Symptoms onset 5 days ago, her PCP saw her and put her on clinda for bronchitis and flagyl for bacterial vaginosis.  SOB briefly improved but then worsened later in the week.  She saw him again yesterday and he put her on norco thinking it would help suppress her cough but it just made her sleepy.  Earlier this evening she began to develop bilateral chest pain in her shoulders as well as the persistent shortness of breath.  In the ED she did have a low grade temp of 100.1 but no other SIRS criteria and WBC was only 9.8.  The ED began empiric treatment for PNA with rocephin and azithromycin and ordered blood cultures.  Unfortunately the patients CT chest when obtained demonstrated multiple bilateral masses and nodules as well as hilar lympadenopathy more suggestive of metastatic disease, less likely septic emboli.  Hospitalist has been asked to admit.  Review of Systems: 12 systems reviewed and negative.  Past Medical History  Diagnosis Date  . Tubal pregnancy   . Hypertension   . Ovarian cyst    Past Surgical History  Procedure Date  . Tubal ligation   . Laparoscopic salpingoopherectomy     unsure of which side.    Social History:  reports that she has been passively smoking.  She does not have any smokeless tobacco history on file. She reports that she drinks alcohol. She reports that she does not use illicit drugs. Patient lives at home, a couple of months back her father died, then her husband died 30 days later.  No Known Allergies  No family history on file.  Prior to Admission medications   Medication Sig Start Date End Date Taking? Authorizing Provider  HYDROcodone-acetaminophen  (NORCO/VICODIN) 5-325 MG per tablet Take 1-2 tablets by mouth every 6 (six) hours as needed. For pain   Yes Historical Provider, MD  lisinopril-hydrochlorothiazide (PRINZIDE,ZESTORETIC) 20-12.5 MG per tablet Take 1 tablet by mouth daily.     Yes Historical Provider, MD  vitamin C (ASCORBIC ACID) 500 MG tablet Take 500 mg by mouth daily.   Yes Historical Provider, MD   Physical Exam: Filed Vitals:   10/30/12 2336 10/31/12 0012 10/31/12 0027 10/31/12 0100  BP:  130/75  119/71  Pulse: 102 116  109  Temp:   99.7 F (37.6 C)   TempSrc:   Oral   Resp: 16 17  17   Height:      Weight:      SpO2: 98% 98%  100%    General:  Non toxic, understandably has emotional distress regarding diagnostic uncertainty, resting comfortably in bed Eyes: PEERLA EOMI ENT: mucous membranes moist Neck: supple w/o JVD Cardiovascular: RRR w/o MRG Respiratory: CTA B Abdomen: soft, nt, nd, bs+ Skin: no rash nor lesion Musculoskeletal: MAE, full ROM all 4 extremities Psychiatric: tearful affect Neurologic: AAOx3, grossly non-focal  Labs on Admission:  Basic Metabolic Panel:  Lab 10/30/12 8119  NA 132*  K 3.7  CL 95*  CO2 26  GLUCOSE 109*  BUN 16  CREATININE 0.85  CALCIUM 9.5  MG --  PHOS --   Liver Function Tests: No results found for this basename: AST:5,ALT:5,ALKPHOS:5,BILITOT:5,PROT:5,ALBUMIN:5 in the last 168 hours No results found for  this basename: LIPASE:5,AMYLASE:5 in the last 168 hours No results found for this basename: AMMONIA:5 in the last 168 hours CBC:  Lab 10/30/12 2314  WBC 9.8  NEUTROABS 6.9  HGB 11.8*  HCT 35.2*  MCV 86.3  PLT 306   Cardiac Enzymes:  Lab 10/30/12 2314  CKTOTAL --  CKMB --  CKMBINDEX --  TROPONINI <0.30    BNP (last 3 results) No results found for this basename: PROBNP:3 in the last 8760 hours CBG: No results found for this basename: GLUCAP:5 in the last 168 hours  Radiological Exams on Admission: Dg Chest 2 View  10/31/2012  *RADIOLOGY  REPORT*  Clinical Data: Chest pain, shortness of breath, wheezing, hypertension  CHEST - 2 VIEW  Comparison: 11/30/2009  Findings: Normal heart size. Minimal enlargement of the pulmonary hila especially left. Nodular appearing of poorly defined opacities in the lungs bilaterally, new. Minimal subsegmental atelectasis right base. No pleural effusion or pneumothorax. Bones unremarkable.  IMPRESSION: Mild bilateral hilar enlargement raising question of adenopathy, greater on left. Right basilar atelectasis. New patchy poorly defined somewhat nodular-appearing opacities in both lungs. These could represent multifocal pneumonia, septic emboli, or poorly defined pulmonary nodules. Further evaluation by CT chest with IV contrast recommended to exclude pulmonary nodules.   Original Report Authenticated By: Ulyses Southward, M.D.    Ct Chest W Contrast  10/31/2012  *RADIOLOGY REPORT*  Clinical Data:  Abnormal chest x-ray, chest pain, shortness of breath, wheezing  CT CHEST WITH CONTRAST  Technique:  Multidetector CT imaging of the chest was performed following the standard protocol during bolus administration of intravenous contrast. Sagittal and coronal MPR images reconstructed from axial data set.  Contrast: OMNIPAQUE IOHEXOL 300 MG/ML  SOLN  Comparison: Chest radiograph 10/30/2012  Findings: Thoracic vascular structures patent on non dedicated exam. Visualized portion of upper abdomen normal appearance. Mediastinal and hilar adenopathy. Enlarged subcarinal node 3.5 x 1.6 cm image 23. Large right paratracheal node 1.9 x 1.1 cm image 14. Multiple bilateral solid appearing masses are seen in both lungs. Largest lesion right lower lobe approximately 4.0 x 3.0 cm image 36. Left lung base mass 3.3 x 2.6 cm image 36. Right upper lobe lesion image 19 2.8 x 2.0 cm demonstrates air bronchograms. Additional peripheral based opacities are identified, somewhat plaque-like with poorly defined margins, particularly in the left  chest. No pleural effusion or pneumothorax. Few tiny pulmonary nodules noted. No osseous lesions.  IMPRESSION: Mediastinal or hilar adenopathy as above. Bilateral lung nodules/masses up to 4.0 cm greatest size, single of which in the right upper lobe shows air bronchogram. With a normal white count, favor tumor over septic emboli or multi focal infection, or potentially lymphoma. Tissue diagnosis recommended. Recommend breast exam and mammography follow-up as well.   Original Report Authenticated By: Ulyses Southward, M.D.     EKG: Independently reviewed.  Assessment/Plan Principal Problem:  *Lung mass   1. Multiple lung masses / nodules - blood cultures pending in case this is indeed septic emboli, patient has no cardiac murmur, will still order 2d echo, no evidence of sepsis at this point and radiologist feels these are more likely metastatic malignant lesions than septic emboli so will hold off on additional antibiotics at this point.  Patient likely needs tissue diagnosis probably via IR biopsy vs bronchoscopy / EBUS.  No consultations obtained.  Code Status: Full Code (must indicate code status--if unknown or must be presumed, indicate so) Family Communication: Spoke with mother and 2 other family members in room (indicate  person spoken with, if applicable, with phone number if by telephone) Disposition Plan: Admit to obs (indicate anticipated LOS)  Time spent: 70 min  Bernadine Melecio M. [facility] Pager [phone]  If 7PM-7AM, please contact night-coverage [email or web address] Password [email or web address] 10/31/2012, 2:50 AM

## 2012-10-31 NOTE — Progress Notes (Signed)
[facility] PROGRESS NOTE  Lauren Russo ZOX:[id number] DOB: November 17, 1971 DOA: 10/30/2012 PCP: Quitman Livings, MD  Assessment/Plan: 1. Pleuritic chest pain/SOB--presumed secondary to pulmonary disease. Cardiac enzymes negative, EKG unremarkable. Discussed with radiologist Dr. Dante Gang not a dedicated PE study, he feels confident there is no PE. 2. Bilateral lung nodules/masses--ddx includes sarcoidosis, tumor, lymphoma, septic emboli, multi-focal infection. Infection doubted with normal WBC, hemodynamics, temperature. Blood cultures pending. Pulmonary consult for further evaluation, consider bronchoscopy or CT-guided biopsy. Breast exam negative in ED, consider outpatient mammography given family history of early breast cancer. 3. Normocytic anemia--follow-up as outpatient. 4. Random hyperglycemia-evaluation can be deferred at this point.  Code Status: Full code Family Communication: discussed with sister at bedside Disposition Plan: home when improved  Brendia Sacks, MD  [facility] Team 6 Pager [phone] If 8PM-8AM, please contact night-coverage at [email or web address], password [facility] 10/31/2012, 8:33 AM  LOS: 1 day   Brief narrative: 41 year old woman with 5 day history of cough, SOB, pleuritic pain and DOE treated as outpatient for bronchitis, presented  Consultants:  Pulmonology  Procedures:    HPI/Subjective: Some SOB, pleurisy. No n/v.  Objective: Filed Vitals:   10/31/12 0012 10/31/12 0027 10/31/12 0100 10/31/12 0332  BP: 130/75  119/71 122/82  Pulse: 116  109 94  Temp:  99.7 F (37.6 C)  98.4 F (36.9 C)  TempSrc:  Oral  Oral  Resp: 17  17   Height:    4\' 11"  (1.499 m)  Weight:    74.1 kg (163 lb 5.8 oz)  SpO2: 98%  100% 100%    Intake/Output Summary (Last 24 hours) at 10/31/12 0833 Last data filed at 10/31/12 0657  Gross per 24 hour  Intake 196.25 ml  Output      0 ml  Net 196.25 ml   Filed Weights   10/30/12 2307 10/31/12 0332   Weight: 73.936 kg (163 lb) 74.1 kg (163 lb 5.8 oz)    Exam:  General:  Appears calm and comfortable Eyes: PERRL, normal lids, irises ENT: grossly normal hearing Neck: no LAD, masses or thyromegaly Cardiovascular: RRR, no m/r/g. No LE edema. Telemetry: SR, no arrhythmias  Respiratory: CTA bilaterally, no w/r/r. Normal respiratory effort. Abdomen: soft, ntnd Skin: no rash or induration seen on limited exam Psychiatric: grossly normal mood and affect, speech fluent and appropriate  Data Reviewed: Basic Metabolic Panel:  Lab 10/31/12 8295 10/30/12 2314  NA 136 132*  K 3.5 3.7  CL 99 95*  CO2 26 26  GLUCOSE 157* 109*  BUN 11 16  CREATININE 0.69 0.85  CALCIUM 8.8 9.5  MG -- --  PHOS -- --   CBC:  Lab 10/31/12 0504 10/30/12 2314  WBC 9.6 9.8  NEUTROABS -- 6.9  HGB 10.8* 11.8*  HCT 32.0* 35.2*  MCV 86.3 86.3  PLT 277 306   Cardiac Enzymes:  Lab 10/30/12 2314  CKTOTAL --  CKMB --  CKMBINDEX --  TROPONINI <0.30   Studies: Dg Chest 2 View  10/31/2012  *RADIOLOGY REPORT*  Clinical Data: Chest pain, shortness of breath, wheezing, hypertension  CHEST - 2 VIEW  Comparison: 11/30/2009  Findings: Normal heart size. Minimal enlargement of the pulmonary hila especially left. Nodular appearing of poorly defined opacities in the lungs bilaterally, new. Minimal subsegmental atelectasis right base. No pleural effusion or pneumothorax. Bones unremarkable.  IMPRESSION: Mild bilateral hilar enlargement raising question of adenopathy, greater on left. Right basilar atelectasis. New patchy poorly defined somewhat nodular-appearing opacities in both lungs. These could represent multifocal pneumonia, septic  emboli, or poorly defined pulmonary nodules. Further evaluation by CT chest with IV contrast recommended to exclude pulmonary nodules.   Original Report Authenticated By: Ulyses Southward, M.D.    Ct Chest W Contrast  10/31/2012  *RADIOLOGY REPORT*  Clinical Data:  Abnormal chest x-ray,  chest pain, shortness of breath, wheezing  CT CHEST WITH CONTRAST  Technique:  Multidetector CT imaging of the chest was performed following the standard protocol during bolus administration of intravenous contrast. Sagittal and coronal MPR images reconstructed from axial data set.  Contrast: OMNIPAQUE IOHEXOL 300 MG/ML  SOLN  Comparison: Chest radiograph 10/30/2012  Findings: Thoracic vascular structures patent on non dedicated exam. Visualized portion of upper abdomen normal appearance. Mediastinal and hilar adenopathy. Enlarged subcarinal node 3.5 x 1.6 cm image 23. Large right paratracheal node 1.9 x 1.1 cm image 14. Multiple bilateral solid appearing masses are seen in both lungs. Largest lesion right lower lobe approximately 4.0 x 3.0 cm image 36. Left lung base mass 3.3 x 2.6 cm image 36. Right upper lobe lesion image 19 2.8 x 2.0 cm demonstrates air bronchograms. Additional peripheral based opacities are identified, somewhat plaque-like with poorly defined margins, particularly in the left chest. No pleural effusion or pneumothorax. Few tiny pulmonary nodules noted. No osseous lesions.  IMPRESSION: Mediastinal or hilar adenopathy as above. Bilateral lung nodules/masses up to 4.0 cm greatest size, single of which in the right upper lobe shows air bronchogram. With a normal white count, favor tumor over septic emboli or multi focal infection, or potentially lymphoma. Tissue diagnosis recommended. Recommend breast exam and mammography follow-up as well.   Original Report Authenticated By: Ulyses Southward, M.D.     Scheduled Meds:   . heparin  5,000 Units Subcutaneous Q8H  . hydrochlorothiazide  12.5 mg Oral Daily  . ketorolac  15 mg Intravenous Once  . lisinopril  20 mg Oral Daily  . sodium chloride  3 mL Intravenous Q12H  . vitamin C  500 mg Oral Daily   Continuous Infusions:   . sodium chloride 75 mL/hr at 10/31/12 0420    Principal Problem:  *Lung mass Active Problems:  Chest pain   Normocytic anemia  Hyperglycemia     Brendia Sacks, MD  [facility] Team 6 Pager [phone] If 8PM-8AM, please contact night-coverage at [email or web address], password [facility] 10/31/2012, 8:33 AM  LOS: 1 day   Time spent: 25 minutes

## 2012-10-31 NOTE — Progress Notes (Signed)
Utilization Review Completed.Lauren Russo T12/15/2013   

## 2012-10-31 NOTE — Progress Notes (Signed)
VASCULAR LAB PRELIMINARY  PRELIMINARY  PRELIMINARY  PRELIMINARY  Bilateral lower extremity venous Dopplers completed.    Preliminary report:  There is no DVT or SVT noted in the bilateral lower extremities.  Wilbur Oakland, RVT 10/31/2012, 4:01 PM

## 2012-11-01 ENCOUNTER — Ambulatory Visit (HOSPITAL_COMMUNITY): Payer: BC Managed Care – PPO

## 2012-11-01 DIAGNOSIS — R0602 Shortness of breath: Secondary | ICD-10-CM

## 2012-11-01 DIAGNOSIS — R079 Chest pain, unspecified: Secondary | ICD-10-CM

## 2012-11-01 LAB — HIV ANTIBODY (ROUTINE TESTING W REFLEX): HIV: NONREACTIVE

## 2012-11-01 LAB — BASIC METABOLIC PANEL
BUN: 11 mg/dL (ref 6–23)
Chloride: 100 mEq/L (ref 96–112)
GFR calc Af Amer: >90 mL/min (ref >90)
Glucose, Bld: 135 mg/dL — ABNORMAL HIGH (ref 70–99)
Potassium: 4 mEq/L (ref 3.5–5.1)

## 2012-11-01 LAB — LEGIONELLA ANTIGEN, URINE: Legionella Antigen, Urine: NEGATIVE

## 2012-11-01 LAB — ANGIOTENSIN CONVERTING ENZYME: Angiotensin-Converting Enzyme: 12 U/L (ref 8–52)

## 2012-11-01 LAB — ANCA SCREEN W REFLEX TITER: Atypical p-ANCA Screen: NEGATIVE

## 2012-11-01 LAB — ANTI-DNA ANTIBODY, DOUBLE-STRANDED: ds DNA Ab: 1 IU/mL (ref <30)

## 2012-11-01 MED ORDER — IOHEXOL 300 MG/ML  SOLN
100.0000 mL | Freq: Once | INTRAMUSCULAR | Status: AC | PRN
  Administered 2012-11-01: 100 mL via INTRAVENOUS

## 2012-11-01 MED ORDER — CYCLOBENZAPRINE HCL 5 MG PO TABS
5.0000 mg | ORAL_TABLET | Freq: Three times a day (TID) | ORAL | Status: DC | PRN
  Administered 2012-11-01: 5 mg via ORAL
  Filled 2012-11-01: qty 1

## 2012-11-01 NOTE — Progress Notes (Signed)
Patient ID: Lauren Russo, female   DOB: 1971/04/15, 41 y.o.   MRN: [id number] Request received for CT guided lung mass biopsy on pt with history of pleuritic chest pain , dyspnea, cough, bilateral lung masses and mediastinal/hilar adenopathy. Imaging studies were reviewed by Dr. Deanne Coffer. Additional PMH as below. Exam: pt awake/alert; chest - CTA bilat; heart-  tachy but regular rhythm; abd- soft, +BS,NT; ext- FROM.     Filed Vitals:   10/31/12 1440 10/31/12 2157 11/01/12 0458 11/01/12 1018  BP: 105/65 106/67 114/68 121/62  Pulse: 92 101 93   Temp: 98.7 F (37.1 C) 98.7 F (37.1 C) 99.4 F (37.4 C)   TempSrc: Oral Oral Oral   Resp: 20 18 18    Height:      Weight:      SpO2: 100% 100% 97%    Past Medical History  Diagnosis Date  . Tubal pregnancy   . Hypertension   . Ovarian cyst    Past Surgical History  Procedure Date  . Tubal ligation   . Laparoscopic salpingoopherectomy     unsure of which side.    Dg Chest 2 View  10/31/2012  *RADIOLOGY REPORT*  Clinical Data: Chest pain, shortness of breath, wheezing, hypertension  CHEST - 2 VIEW  Comparison: 11/30/2009  Findings: Normal heart size. Minimal enlargement of the pulmonary hila especially left. Nodular appearing of poorly defined opacities in the lungs bilaterally, new. Minimal subsegmental atelectasis right base. No pleural effusion or pneumothorax. Bones unremarkable.  IMPRESSION: Mild bilateral hilar enlargement raising question of adenopathy, greater on left. Right basilar atelectasis. New patchy poorly defined somewhat nodular-appearing opacities in both lungs. These could represent multifocal pneumonia, septic emboli, or poorly defined pulmonary nodules. Further evaluation by CT chest with IV contrast recommended to exclude pulmonary nodules.   Original Report Authenticated By: Ulyses Southward, M.D.    Ct Soft Tissue Neck W Contrast  11/01/2012  *RADIOLOGY REPORT*  Clinical Data: Right-sided neck pain.  Bilateral pulmonary  lesions. Low grade fever.  Rule out Lemierre syndrome  CT NECK WITH CONTRAST  Technique:  Multidetector CT imaging of the neck was performed with intravenous contrast.  Contrast: OMNIPAQUE IOHEXOL 300 MG/ML  SOLN  Comparison: None.  Findings: Small low density area in the left lower tonsillar region measuring approximately 6 mm.  This could be a small area of acute or chronic infection.  The surrounding soft tissues appear normal. Right-sided tonsillar tissue is normal.  Negative for jugular vein thrombosis or surrounding edema.  Negative for mass or adenopathy.  Parotid and submandibular glands are normal bilaterally.  No acute bony abnormality.  Bilateral lung lesions are present as described on the chest CT.  IMPRESSION: 6 mm low density area in the left tonsil which may represent acute or chronic infection.  No evidence of jugular vein thrombosis that would explain pulmonary septic emboli.   Original Report Authenticated By: Janeece Riggers, M.D.    Ct Chest W Contrast  10/31/2012  *RADIOLOGY REPORT*  Clinical Data:  Abnormal chest x-ray, chest pain, shortness of breath, wheezing  CT CHEST WITH CONTRAST  Technique:  Multidetector CT imaging of the chest was performed following the standard protocol during bolus administration of intravenous contrast. Sagittal and coronal MPR images reconstructed from axial data set.  Contrast: OMNIPAQUE IOHEXOL 300 MG/ML  SOLN  Comparison: Chest radiograph 10/30/2012  Findings: Thoracic vascular structures patent on non dedicated exam. Visualized portion of upper abdomen normal appearance. Mediastinal and hilar adenopathy. Enlarged subcarinal node 3.5  x 1.6 cm image 23. Large right paratracheal node 1.9 x 1.1 cm image 14. Multiple bilateral solid appearing masses are seen in both lungs. Largest lesion right lower lobe approximately 4.0 x 3.0 cm image 36. Left lung base mass 3.3 x 2.6 cm image 36. Right upper lobe lesion image 19 2.8 x 2.0 cm demonstrates air  bronchograms. Additional peripheral based opacities are identified, somewhat plaque-like with poorly defined margins, particularly in the left chest. No pleural effusion or pneumothorax. Few tiny pulmonary nodules noted. No osseous lesions.  IMPRESSION: Mediastinal or hilar adenopathy as above. Bilateral lung nodules/masses up to 4.0 cm greatest size, single of which in the right upper lobe shows air bronchogram. With a normal white count, favor tumor over septic emboli or multi focal infection, or potentially lymphoma. Tissue diagnosis recommended. Recommend breast exam and mammography follow-up as well.   Original Report Authenticated By: Ulyses Southward, M.D.   Results for orders placed during the hospital encounter of 10/30/12  CBC      Component Value Range   WBC 9.8  4.0 - 10.5 K/uL   RBC 4.08  3.87 - 5.11 MIL/uL   Hemoglobin 11.8 (*) 12.0 - 15.0 g/dL   HCT 40.9 (*) 81.1 - 91.4 %   MCV 86.3  78.0 - 100.0 fL   MCH 28.9  26.0 - 34.0 pg   MCHC 33.5  30.0 - 36.0 g/dL   RDW 78.2  95.6 - 21.3 %   Platelets 306  150 - 400 K/uL  BASIC METABOLIC PANEL      Component Value Range   Sodium 132 (*) 135 - 145 mEq/L   Potassium 3.7  3.5 - 5.1 mEq/L   Chloride 95 (*) 96 - 112 mEq/L   CO2 26  19 - 32 mEq/L   Glucose, Bld 109 (*) 70 - 99 mg/dL   BUN 16  6 - 23 mg/dL   Creatinine, Ser 0.86  0.50 - 1.10 mg/dL   Calcium 9.5  8.4 - 57.8 mg/dL   GFR calc non Af Amer 85 (*) >90 mL/min   GFR calc Af Amer >90  >90 mL/min  TROPONIN I      Component Value Range   Troponin I <0.30  <0.30 ng/mL  DIFFERENTIAL      Component Value Range   Neutrophils Relative 71  43 - 77 %   Neutro Abs 6.9  1.7 - 7.7 K/uL   Lymphocytes Relative 17  12 - 46 %   Lymphs Abs 1.7  0.7 - 4.0 K/uL   Monocytes Relative 11  3 - 12 %   Monocytes Absolute 1.1 (*) 0.1 - 1.0 K/uL   Eosinophils Relative 1  0 - 5 %   Eosinophils Absolute 0.1  0.0 - 0.7 K/uL   Basophils Relative 0  0 - 1 %   Basophils Absolute 0.0  0.0 - 0.1 K/uL  CULTURE,  BLOOD (ROUTINE X 2)      Component Value Range   Specimen Description BLOOD LEFT HAND     Special Requests BOTTLES DRAWN AEROBIC AND ANAEROBIC 4.5CC     Culture  Setup Time 10/31/2012 16:07     Culture       Value:        BLOOD CULTURE RECEIVED NO GROWTH TO DATE CULTURE WILL BE HELD FOR 5 DAYS BEFORE ISSUING A FINAL NEGATIVE REPORT   Report Status PENDING    CULTURE, BLOOD (ROUTINE X 2)      Component Value Range  Specimen Description BLOOD RIGHT ANTECUBITAL     Special Requests BOTTLES DRAWN AEROBIC AND ANAEROBIC 4.5CC     Culture  Setup Time 10/31/2012 16:07     Culture       Value:        BLOOD CULTURE RECEIVED NO GROWTH TO DATE CULTURE WILL BE HELD FOR 5 DAYS BEFORE ISSUING A FINAL NEGATIVE REPORT   Report Status PENDING    CBC      Component Value Range   WBC 9.6  4.0 - 10.5 K/uL   RBC 3.71 (*) 3.87 - 5.11 MIL/uL   Hemoglobin 10.8 (*) 12.0 - 15.0 g/dL   HCT 16.1 (*) 09.6 - 04.5 %   MCV 86.3  78.0 - 100.0 fL   MCH 29.1  26.0 - 34.0 pg   MCHC 33.8  30.0 - 36.0 g/dL   RDW 40.9  81.1 - 91.4 %   Platelets 277  150 - 400 K/uL  BASIC METABOLIC PANEL      Component Value Range   Sodium 136  135 - 145 mEq/L   Potassium 3.5  3.5 - 5.1 mEq/L   Chloride 99  96 - 112 mEq/L   CO2 26  19 - 32 mEq/L   Glucose, Bld 157 (*) 70 - 99 mg/dL   BUN 11  6 - 23 mg/dL   Creatinine, Ser 7.82  0.50 - 1.10 mg/dL   Calcium 8.8  8.4 - 95.6 mg/dL   GFR calc non Af Amer >90  >90 mL/min   GFR calc Af Amer >90  >90 mL/min  PREGNANCY, URINE      Component Value Range   Preg Test, Ur NEGATIVE  NEGATIVE  PROCALCITONIN      Component Value Range   Procalcitonin <0.10    ANA      Component Value Range   ANA NEGATIVE  NEGATIVE  MPO/PR-3 (ANCA) ANTIBODIES      Component Value Range   Myeloperoxidase Abs 1  <20 AU/mL   Serine Protease 3 1  <20 AU/mL  ANGIOTENSIN CONVERTING ENZYME      Component Value Range   Angiotensin-Converting Enzyme 5 (*) 8 - 52 U/L  ANTI-DNA ANTIBODY, DOUBLE-STRANDED       Component Value Range   ds DNA Ab 1  <30 IU/mL  SEDIMENTATION RATE      Component Value Range   Sed Rate 39 (*) 0 - 22 mm/hr  C-REACTIVE PROTEIN      Component Value Range   CRP 7.3 (*) <0.60 mg/dL  LACTIC ACID, PLASMA      Component Value Range   Lactic Acid, Venous 0.9  0.5 - 2.2 mmol/L  RHEUMATOID FACTOR      Component Value Range   Rheumatoid Factor <10  <=14 IU/mL  CYCLIC CITRUL PEPTIDE ANTIBODY, IGG      Component Value Range   Cyclic Citrullin Peptide Ab <2.1  0.0 - 5.0 U/mL  ANTI-SCLERODERMA ANTIBODY      Component Value Range   Scleroderma (Scl-70) (ENA) Antibody, IgG 2  <30 AU/mL  CK      Component Value Range   Total CK 49  7 - 177 U/L  LEGIONELLA ANTIGEN, URINE      Component Value Range   Specimen Description URINE, CLEAN CATCH     Special Requests NONE     Legionella Antigen, Urine Negative for Legionella pneumophilia serogroup 1     Report Status 11/01/2012 FINAL    STREP PNEUMONIAE URINARY ANTIGEN  Component Value Range   Strep Pneumo Urinary Antigen NEGATIVE  NEGATIVE  AFP TUMOR MARKER      Component Value Range   AFP-Tumor Marker <1.3  0.0 - 8.0 ng/mL  HCG, QUANTITATIVE, PREGNANCY      Component Value Range   hCG, Beta Chain, Quant, S <1  <5 mIU/mL  HEPATIC FUNCTION PANEL      Component Value Range   Total Protein 6.8  6.0 - 8.3 g/dL   Albumin 3.1 (*) 3.5 - 5.2 g/dL   AST 23  0 - 37 U/L   ALT 33  0 - 35 U/L   Alkaline Phosphatase 71  39 - 117 U/L   Total Bilirubin 0.2 (*) 0.3 - 1.2 mg/dL   Bilirubin, Direct <7.8  0.0 - 0.3 mg/dL   Indirect Bilirubin NOT CALCULATED  0.3 - 0.9 mg/dL  HIV ANTIBODY (ROUTINE TESTING)      Component Value Range   HIV NON REACTIVE  NON REACTIVE  ANGIOTENSIN CONVERTING ENZYME      Component Value Range   Angiotensin-Converting Enzyme 12  8 - 52 U/L  BASIC METABOLIC PANEL      Component Value Range   Sodium 135  135 - 145 mEq/L   Potassium 4.0  3.5 - 5.1 mEq/L   Chloride 100  96 - 112 mEq/L   CO2 27  19 - 32  mEq/L   Glucose, Bld 135 (*) 70 - 99 mg/dL   BUN 11  6 - 23 mg/dL   Creatinine, Ser 2.95  0.50 - 1.10 mg/dL   Calcium 9.3  8.4 - 62.1 mg/dL   GFR calc non Af Amer 85 (*) >90 mL/min   GFR calc Af Amer >90  >90 mL/min   A/P: Pt with hx of mediastinal/hilar adenopathy, and bilateral lung masses. Plan is for CT guided biopsy of RLL lung mass on 12/17. Specimen will be sent for pathology and culture.Details/risks of procedure d/w pt with her understanding and consent.

## 2012-11-01 NOTE — Progress Notes (Signed)
[facility] PROGRESS NOTE  Lauren Russo ZOX:[id number] DOB: 09-Jan-1971 DOA: 10/30/2012 PCP: Quitman Livings, MD  Assessment/Plan: 1. Pleuritic chest pain/SOB--presumed secondary to pulmonary disease. Cardiac enzymes negative, EKG unremarkable. Discussed with radiologist Dr. Eppie Gibson 12/15--although not a dedicated PE study, he feels confident there is no PE. Echocardiogram pending. 2. Bilateral lung nodules/masses--ddx includes sarcoidosis, Lemierre's (neck pain started symptoms) tumor, lymphoma, septic emboli, multi-focal infection. Blood cultures pending. Pulmonary consult appreciated (discussed with Dr. Marchelle Gearing 12/15), consider bronchoscopy or CT-guided biopsy if CT neck negative. Breast exam negative in ED, consider outpatient mammography given family history of early breast cancer. 3. Normocytic anemia--follow-up as outpatient. 4. Random hyperglycemia-evaluation can be deferred at this point.  Although not febrile, was treated with clindamycin as outpatient. Symptoms began with right sided neck pain. Discussed with radiologist 12/15, U/S neck not felt to be useful to assess for Lemierre's. CT neck pending. If negative need to proceed with biopsy. If positive can start abx and likely discharge home.  Code Status: Full code Family Communication: discussed with sister at bedside Disposition Plan: home when improved  Brendia Sacks, MD  [facility] Team 6 Pager [phone] If 8PM-8AM, please contact night-coverage at [email or web address], password [facility] 11/01/2012, 11:16 AM  LOS: 2 days   Brief narrative: 41 year old woman with 5 day history of cough, SOB, pleuritic pain and DOE treated as outpatient for bronchitis, presented  Consultants:  Pulmonology  Procedures:    HPI/Subjective: Feels ok, but still SOB and pain with deep breath. Right-sided neck pain persists.  Objective: Filed Vitals:   10/31/12 1440 10/31/12 2157 11/01/12 0458 11/01/12 1018  BP: 105/65 106/67  114/68 121/62  Pulse: 92 101 93   Temp: 98.7 F (37.1 C) 98.7 F (37.1 C) 99.4 F (37.4 C)   TempSrc: Oral Oral Oral   Resp: 20 18 18    Height:      Weight:      SpO2: 100% 100% 97%     Intake/Output Summary (Last 24 hours) at 11/01/12 1116 Last data filed at 11/01/12 0828  Gross per 24 hour  Intake   1500 ml  Output      0 ml  Net   1500 ml   Filed Weights   10/30/12 2307 10/31/12 0332  Weight: 73.936 kg (163 lb) 74.1 kg (163 lb 5.8 oz)    Exam:  General:  Appears calm and comfortable Neck: muscle spasm right posterior neck, tender, no anterior LAD Mouth: poor dentition, no masses, exudate. O-P appears unremarkable. Cardiovascular: RRR, no m/r/g. No LE edema. Respiratory: CTA bilaterally, no w/r/r. Normal respiratory effort. Psychiatric: grossly normal mood and affect, speech fluent and appropriate  Data Reviewed: Basic Metabolic Panel:  Lab 11/01/12 8295 10/31/12 0504 10/30/12 2314  NA 135 136 132*  K 4.0 3.5 3.7  CL 100 99 95*  CO2 27 26 26   GLUCOSE 135* 157* 109*  BUN 11 11 16   CREATININE 0.85 0.69 0.85  CALCIUM 9.3 8.8 9.5  MG -- -- --  PHOS -- -- --   CBC:  Lab 10/31/12 0504 10/30/12 2314  WBC 9.6 9.8  NEUTROABS -- 6.9  HGB 10.8* 11.8*  HCT 32.0* 35.2*  MCV 86.3 86.3  PLT 277 306   Cardiac Enzymes:  Lab 10/31/12 1332 10/30/12 2314  CKTOTAL 49 --  CKMB -- --  CKMBINDEX -- --  TROPONINI -- <0.30   Studies: Dg Chest 2 View  10/31/2012  *RADIOLOGY REPORT*  Clinical Data: Chest pain, shortness of breath, wheezing, hypertension  CHEST - 2  VIEW  Comparison: 11/30/2009  Findings: Normal heart size. Minimal enlargement of the pulmonary hila especially left. Nodular appearing of poorly defined opacities in the lungs bilaterally, new. Minimal subsegmental atelectasis right base. No pleural effusion or pneumothorax. Bones unremarkable.  IMPRESSION: Mild bilateral hilar enlargement raising question of adenopathy, greater on left. Right basilar  atelectasis. New patchy poorly defined somewhat nodular-appearing opacities in both lungs. These could represent multifocal pneumonia, septic emboli, or poorly defined pulmonary nodules. Further evaluation by CT chest with IV contrast recommended to exclude pulmonary nodules.   Original Report Authenticated By: Ulyses Southward, M.D.    Ct Chest W Contrast  10/31/2012  *RADIOLOGY REPORT*  Clinical Data:  Abnormal chest x-ray, chest pain, shortness of breath, wheezing  CT CHEST WITH CONTRAST  Technique:  Multidetector CT imaging of the chest was performed following the standard protocol during bolus administration of intravenous contrast. Sagittal and coronal MPR images reconstructed from axial data set.  Contrast: OMNIPAQUE IOHEXOL 300 MG/ML  SOLN  Comparison: Chest radiograph 10/30/2012  Findings: Thoracic vascular structures patent on non dedicated exam. Visualized portion of upper abdomen normal appearance. Mediastinal and hilar adenopathy. Enlarged subcarinal node 3.5 x 1.6 cm image 23. Large right paratracheal node 1.9 x 1.1 cm image 14. Multiple bilateral solid appearing masses are seen in both lungs. Largest lesion right lower lobe approximately 4.0 x 3.0 cm image 36. Left lung base mass 3.3 x 2.6 cm image 36. Right upper lobe lesion image 19 2.8 x 2.0 cm demonstrates air bronchograms. Additional peripheral based opacities are identified, somewhat plaque-like with poorly defined margins, particularly in the left chest. No pleural effusion or pneumothorax. Few tiny pulmonary nodules noted. No osseous lesions.  IMPRESSION: Mediastinal or hilar adenopathy as above. Bilateral lung nodules/masses up to 4.0 cm greatest size, single of which in the right upper lobe shows air bronchogram. With a normal white count, favor tumor over septic emboli or multi focal infection, or potentially lymphoma. Tissue diagnosis recommended. Recommend breast exam and mammography follow-up as well.   Original Report Authenticated  By: Ulyses Southward, M.D.     Scheduled Meds:    . heparin  5,000 Units Subcutaneous Q8H  . hydrochlorothiazide  12.5 mg Oral Daily  . lisinopril  20 mg Oral Daily  . sodium chloride  3 mL Intravenous Q12H  . vitamin C  500 mg Oral Daily   Continuous Infusions:    . sodium chloride 75 mL/hr at 10/31/12 1827    Principal Problem:  *Multiple pulmonary nodules Active Problems:  Lung mass  Chest pain  Normocytic anemia  Hyperglycemia  Mediastinal adenopathy  Hilar adenopathy     Brendia Sacks, MD  [facility] Team 6 Pager [phone] If 8PM-8AM, please contact night-coverage at [email or web address], password [facility] 11/01/2012, 11:16 AM  LOS: 2 days   Time spent: 25 minutes

## 2012-11-01 NOTE — Consult Note (Signed)
PULMONARY  / CRITICAL CARE MEDICINE  Name: Lauren Russo MRN: [id number] DOB: [birth year]    LOS: 2 Date of admit : 10/30/2012 11:02 PM   REFERRING PROVIDER:  Dr Doran Stabler  CHIEF COMPLAINT:  Dyspnea, pulm nodules  HPI Lauren Russo is a 41 y.o. female who presented initially with right sided neck pain posteriorly. She quickly was overwhelmed with  cough and SOB. Symptoms onset was acute and 5 days prior to admission. Dyspnea rated as severe and progressive. She was very dyspneic with exerrion and relieved by rest. Her PCP saw her and put her on clinda for bronchitis and flagyl for bacterial vaginosis and also a short course of prednisone.  SOB briefly improved but then worsened later in the week. 3 days prior to admission she noticed right quads and hamstring tightness and myalgia that has persisteed. Then on the evening prior to admission she started noticingn bilateral chest pain  Both anterior ly and posteriorly in her shoulders as well. This was severe and worsened with inspiration.   At arrival in  ER on 10/30/2012 11:02 PM  she did have a low grade temp of 100.1 but no other SIRS criteria and WBC was only 9.8. The ED began empiric treatment for PNA with rocephin and azithromycin and ordered blood cultures. Unfortunately the patients CT chest when obtained demonstrated multiple bilateral masses and nodules as well as mild hilar/mediastinal lymphadenopathy.   Of note, 2004 CT chest done for chest pain: ruled out PE and had clear infitlrates. In 2011 abd ct (for ovarian cyst rupture) lung cut at base was normal. Clearly current findings are new snice 2011  Also of note a) patient husband died in her arms few monthsa ago from MI and she is very stressed; b ) she also lost her step father recently   LINES / TUBES: None  CULTURES: ANTIBIOTICS: Anti-infectives     Start     Dose/Rate Route Frequency Ordered Stop   10/31/12 0030   cefTRIAXone (ROCEPHIN) 1 g in dextrose 5 % 50  mL IVPB        1 g 100 mL/hr over 30 Minutes Intravenous  Once 10/31/12 0023 10/31/12 0205   10/31/12 0030   azithromycin (ZITHROMAX) 500 mg in dextrose 5 % 250 mL IVPB        500 mg 250 mL/hr over 60 Minutes Intravenous  Once 10/31/12 0023 10/31/12 0205         Urine leg 12/15>> Urine strep 12/15>>  Lab 10/31/12 1333  PROCALCITON <0.10     SIGNIFICANT EVENTS:  Admit 10/30/2012 11:02 PM     VITAL SIGNS: Temp:  [98.7 F (37.1 C)-99.4 F (37.4 C)] 99.4 F (37.4 C) (12/16 0458) Pulse Rate:  [92-101] 93  (12/16 0458) Resp:  [18-20] 18  (12/16 0458) BP: (105-121)/(62-68) 121/62 mmHg (12/16 1018) SpO2:  [97 %-100 %] 97 % (12/16 0458)  PHYSICAL EXAMINATION:  BP 121/62  Pulse 93  Temp 99.4 F (37.4 C) (Oral)  Resp 18  Ht 4\' 11"  (1.499 m)  Wt 74.1 kg (163 lb 5.8 oz)  BMI 32.99 kg/m2  SpO2 97%  LMP 10/16/2012  General Appearance:    Alert, cooperative, no distress, appears stated age  Head:    Normocephalic, without obvious abnormality, atraumatic  Eyes:    PERRL,            Neck:   Supple, symmetrical, trachea midline, no adenopathy;    thyroid:  no enlargement/tenderness/nodules;    or JVD  Lungs:     Clear to auscultation bilaterally, respirations unlabored  Chest Wall:    No tenderness or deformity   Heart:    Regular rate and rhythm, S1 and S2 normal, no murmur, rub   or gallop     Abdomen:     Soft, non-tender, bowel sounds active all four quadrants,   Genitalia:  Not examined     Extremities:   Extremities normal, atraumatic, no cyanosis or edema  Pulses:   2+ and symmetric all extremities  Skin:   Skin color, texture, turgor normal, no rashes or lesions     Neurologic:   CNII-XII intact, normal strength, sensation and reflexes    throughout       Lab 11/01/12 0819 10/31/12 0504 10/30/12 2314  NA 135 136 132*  K 4.0 3.5 3.7  CL 100 99 95*  CO2 27 26 26   BUN 11 11 16   CREATININE 0.85 0.69 0.85  GLUCOSE 135* 157* 109*    Lab  10/31/12 0504 10/30/12 2314  HGB 10.8* 11.8*  HCT 32.0* 35.2*  WBC 9.6 9.8  PLT 277 306   Dg Chest 2 View  10/31/2012  *RADIOLOGY REPORT*  Clinical Data: Chest pain, shortness of breath, wheezing, hypertension  CHEST - 2 VIEW  Comparison: 11/30/2009  Findings: Normal heart size. Minimal enlargement of the pulmonary hila especially left. Nodular appearing of poorly defined opacities in the lungs bilaterally, new. Minimal subsegmental atelectasis right base. No pleural effusion or pneumothorax. Bones unremarkable.  IMPRESSION: Mild bilateral hilar enlargement raising question of adenopathy, greater on left. Right basilar atelectasis. New patchy poorly defined somewhat nodular-appearing opacities in both lungs. These could represent multifocal pneumonia, septic emboli, or poorly defined pulmonary nodules. Further evaluation by CT chest with IV contrast recommended to exclude pulmonary nodules.   Original Report Authenticated By: Ulyses Southward, M.D.    Ct Chest W Contrast  10/31/2012  *RADIOLOGY REPORT*  Clinical Data:  Abnormal chest x-ray, chest pain, shortness of breath, wheezing  CT CHEST WITH CONTRAST  Technique:  Multidetector CT imaging of the chest was performed following the standard protocol during bolus administration of intravenous contrast. Sagittal and coronal MPR images reconstructed from axial data set.  Contrast: OMNIPAQUE IOHEXOL 300 MG/ML  SOLN  Comparison: Chest radiograph 10/30/2012  Findings: Thoracic vascular structures patent on non dedicated exam. Visualized portion of upper abdomen normal appearance. Mediastinal and hilar adenopathy. Enlarged subcarinal node 3.5 x 1.6 cm image 23. Large right paratracheal node 1.9 x 1.1 cm image 14. Multiple bilateral solid appearing masses are seen in both lungs. Largest lesion right lower lobe approximately 4.0 x 3.0 cm image 36. Left lung base mass 3.3 x 2.6 cm image 36. Right upper lobe lesion image 19 2.8 x 2.0 cm demonstrates air  bronchograms. Additional peripheral based opacities are identified, somewhat plaque-like with poorly defined margins, particularly in the left chest. No pleural effusion or pneumothorax. Few tiny pulmonary nodules noted. No osseous lesions.  IMPRESSION: Mediastinal or hilar adenopathy as above. Bilateral lung nodules/masses up to 4.0 cm greatest size, single of which in the right upper lobe shows air bronchogram. With a normal white count, favor tumor over septic emboli or multi focal infection, or potentially lymphoma. Tissue diagnosis recommended. Recommend breast exam and mammography follow-up as well.   Original Report Authenticated By: Ulyses Southward, M.D.    CT Neck reviewed    ASSESSMENT / PLAN:  Patient Active Hospital Problem List: Multiple pulmonary nodules (10/31/2012)   Mediastinal adenopathy (10/31/2012)  Hilar adenopathy (10/31/2012)      Assessment and Plan   DDx is broad.   - Sarcoid  - probably comes to mind first due to age, and ethnicity but presentation and nature of nodules argues against it but certainly possible. If sarcoid, there is no evidence of sarcoid elsewhere. Will check ACE level    - Septic Embolii - CT angio neck neg for thrombophlebitis of neck veins    - Mets -  Possible. But no evidence of cancer on exam. Will check AFP and HCG. Rest of cancer workup per [facility]     - Autoimmune disease (Wegner, Lupus) - possible. Check autoimmune panel -   Plan:  - there are peripheral large lesions that are amenable to CT guided bx and shoould help reduce false negative results. Will ask IR to proceed-  I discussed risks & benefits with patient in detail  Cyril Mourning MD. FCCP. [facility] Pager [phone] If no response call [phone]     .

## 2012-11-01 NOTE — Progress Notes (Signed)
Nutrition Brief Note  Patient identified on the Malnutrition Screening Tool (MST) Report  Body mass index is 32.99 kg/(m^2). Pt meets criteria for obese class 1 based on current BMI.   Current diet order is regular, patient is consuming approximately 100% of meals at this time. Labs and medications reviewed.   Pt states that she experienced a sudden loss of wt after her husband died in 03/14/23.  She states she lost ~20 lbs in <1 month.  She believes her appetite has not yet returned to her normal but is eating well despite.  She states "I get full easily."  Pt states that since the wt came off, she decided to make healthy change to keep it from coming back and has increased the number of salads and other healthy foods she is eating. RD discussed the benefits of healthy eating on curbing appetite and preventing overeating at meals.  No nutrition interventions warranted at this time. If nutrition issues arise, please consult RD.   Loyce Dys, MS RD LDN Clinical Inpatient Dietitian Pager: [phone] Weekend/After hours pager: [phone]

## 2012-11-02 ENCOUNTER — Ambulatory Visit (HOSPITAL_COMMUNITY): Payer: BC Managed Care – PPO

## 2012-11-02 DIAGNOSIS — R079 Chest pain, unspecified: Secondary | ICD-10-CM

## 2012-11-02 DIAGNOSIS — R0602 Shortness of breath: Secondary | ICD-10-CM

## 2012-11-02 LAB — CBC
Hemoglobin: 10.3 g/dL — ABNORMAL LOW (ref 12.0–15.0)
MCHC: 32.6 g/dL (ref 30.0–36.0)
RBC: 3.64 MIL/uL — ABNORMAL LOW (ref 3.87–5.11)

## 2012-11-02 LAB — BASIC METABOLIC PANEL
GFR calc non Af Amer: 84 mL/min — ABNORMAL LOW (ref >90)
Glucose, Bld: 100 mg/dL — ABNORMAL HIGH (ref 70–99)
Potassium: 4 mEq/L (ref 3.5–5.1)
Sodium: 135 mEq/L (ref 135–145)

## 2012-11-02 LAB — PROTIME-INR: INR: 1 (ref 0.00–1.49)

## 2012-11-02 LAB — APTT: aPTT: 35 seconds (ref 24–37)

## 2012-11-02 MED ORDER — MORPHINE SULFATE 2 MG/ML IJ SOLN
2.0000 mg | INTRAMUSCULAR | Status: DC | PRN
  Administered 2012-11-02: 2 mg via INTRAVENOUS
  Filled 2012-11-02: qty 1

## 2012-11-02 MED ORDER — GUAIFENESIN ER 600 MG PO TB12
600.0000 mg | ORAL_TABLET | Freq: Two times a day (BID) | ORAL | Status: DC
  Administered 2012-11-02 – 2012-11-05 (×7): 600 mg via ORAL
  Filled 2012-11-02 (×8): qty 1

## 2012-11-02 MED ORDER — ALPRAZOLAM 1 MG PO TABS
1.0000 mg | ORAL_TABLET | Freq: Three times a day (TID) | ORAL | Status: DC | PRN
  Administered 2012-11-02 – 2012-11-04 (×5): 1 mg via ORAL
  Filled 2012-11-02 (×5): qty 1

## 2012-11-02 MED ORDER — CYCLOBENZAPRINE HCL 10 MG PO TABS
10.0000 mg | ORAL_TABLET | Freq: Three times a day (TID) | ORAL | Status: DC | PRN
  Filled 2012-11-02: qty 1

## 2012-11-02 NOTE — Progress Notes (Signed)
[facility] PROGRESS NOTE  Lauren Russo ZOX:[id number] DOB: Oct 16, 1971 DOA: 10/30/2012 PCP: Quitman Livings, MD  Assessment/Plan: 1. Pleuritic chest pain/SOB--presumed secondary to pulmonary disease. Cardiac enzymes negative, EKG unremarkable. Discussed with radiologist Dr. Eppie Gibson 12/15--although not a dedicated PE study, he feels confident there is no PE. Echocardiogram pending. 2. Bilateral lung nodules/masses--ddx includes sarcoidosis, metastatic disease (less likely). Lemierre's ruled out by CT neck. No evidence to suggest infection, remains afebrile, Blood cultures NGTD. Pulmonary consult appreciated. Breast exam negative in ED, consider outpatient mammography given family history of early breast cancer. 3. Normocytic anemia--follow-up as outpatient. 4. Random hyperglycemia-evaluation can be deferred at this point.  Code Status: Full code Family Communication: none present Disposition Plan: home when improved  Brendia Sacks, MD  [facility] Team 6 Pager [phone] If 8PM-8AM, please contact night-coverage at [email or web address], password [facility] 11/02/2012, 2:45 PM  LOS: 3 days   Brief narrative: 41 year old woman with 5 day history of cough, SOB, pleuritic pain and DOE treated as outpatient for bronchitis, presented  Consultants:  Pulmonology  Procedures:  12/15 echocardiogram--Left ventricle: The cavity size was normal. Systolic function was normal. Wall motion was normal; there were no regional wall motion abnormalities.  12/16 CT-guided lung biopsy  HPI/Subjective: Anxious, complains of right-sided neck pain, poor sleep, cough in AM.  Objective: Filed Vitals:   11/01/12 2115 11/02/12 0540 11/02/12 0833 11/02/12 1401  BP: 100/57 110/66  114/70  Pulse: 87 97  99  Temp: 99 F (37.2 C) 98.7 F (37.1 C)  98.5 F (36.9 C)  TempSrc: Oral Oral  Oral  Resp: 20 20  20   Height:      Weight:      SpO2: 98% 100% 100% 100%    Intake/Output Summary (Last 24 hours)  at 11/02/12 1445 Last data filed at 11/02/12 1300  Gross per 24 hour  Intake 2228.75 ml  Output    675 ml  Net 1553.75 ml   Filed Weights   10/30/12 2307 10/31/12 0332  Weight: 73.936 kg (163 lb) 74.1 kg (163 lb 5.8 oz)    Exam:  General:  Appears calm and comfortable Neck: limited ROM all directions, nontender to exam Cardiovascular: RRR, no m/r/g. No LE edema. Respiratory: CTA bilaterally, no w/r/r. Normal respiratory effort. Psychiatric: grossly normal mood and affect, speech fluent and appropriate  Data Reviewed: Basic Metabolic Panel:  Lab 11/02/12 8295 11/01/12 0819 10/31/12 0504 10/30/12 2314  NA 135 135 136 132*  K 4.0 4.0 3.5 3.7  CL 100 100 99 95*  CO2 27 27 26 26   GLUCOSE 100* 135* 157* 109*  BUN 11 11 11 16   CREATININE 0.86 0.85 0.69 0.85  CALCIUM 9.0 9.3 8.8 9.5  MG -- -- -- --  PHOS -- -- -- --   CBC:  Lab 11/02/12 0530 10/31/12 0504 10/30/12 2314  WBC 10.1 9.6 9.8  NEUTROABS -- -- 6.9  HGB 10.3* 10.8* 11.8*  HCT 31.6* 32.0* 35.2*  MCV 86.8 86.3 86.3  PLT 271 277 306   Cardiac Enzymes:  Lab 10/31/12 1332 10/30/12 2314  CKTOTAL 49 --  CKMB -- --  CKMBINDEX -- --  TROPONINI -- <0.30   Studies: Ct Soft Tissue Neck W Contrast  11/01/2012  *RADIOLOGY REPORT*  Clinical Data: Right-sided neck pain.  Bilateral pulmonary lesions. Low grade fever.  Rule out Lemierre syndrome  CT NECK WITH CONTRAST  Technique:  Multidetector CT imaging of the neck was performed with intravenous contrast.  Contrast: OMNIPAQUE IOHEXOL 300 MG/ML  SOLN  Comparison: None.  Findings: Small low density area in the left lower tonsillar region measuring approximately 6 mm.  This could be a small area of acute or chronic infection.  The surrounding soft tissues appear normal. Right-sided tonsillar tissue is normal.  Negative for jugular vein thrombosis or surrounding edema.  Negative for mass or adenopathy.  Parotid and submandibular glands are normal bilaterally.  No acute bony  abnormality.  Bilateral lung lesions are present as described on the chest CT.  IMPRESSION: 6 mm low density area in the left tonsil which may represent acute or chronic infection.  No evidence of jugular vein thrombosis that would explain pulmonary septic emboli.   Original Report Authenticated By: Janeece Riggers, M.D.     Scheduled Meds:    . guaiFENesin  600 mg Oral BID  . hydrochlorothiazide  12.5 mg Oral Daily  . lisinopril  20 mg Oral Daily  . sodium chloride  3 mL Intravenous Q12H  . vitamin C  500 mg Oral Daily   Continuous Infusions:    Principal Problem:  *Multiple pulmonary nodules Active Problems:  Lung mass  Chest pain  Normocytic anemia  Hyperglycemia  Mediastinal adenopathy  Hilar adenopathy     Brendia Sacks, MD  [facility] Team 6 Pager [phone] If 8PM-8AM, please contact night-coverage at [email or web address], password [facility] 11/02/2012, 2:45 PM  LOS: 3 days   Time spent: 25 minutes

## 2012-11-02 NOTE — Progress Notes (Signed)
PULMONARY  / CRITICAL CARE MEDICINE  Name: Lauren Russo MRN: [id number] DOB: 1971-03-07    LOS: 3 Date of admit : 10/30/2012 11:02 PM   REFERRING PROVIDER:  Dr Doran Stabler  CHIEF COMPLAINT:  Dyspnea, pulm nodules  HPI Lauren Russo is a 41 y.o. female who presented initially with right sided neck pain posteriorly. She quickly was overwhelmed with  cough and SOB. Symptoms onset was acute and 5 days prior to admission. Dyspnea rated as severe and progressive. She was very dyspneic with exertion and relieved by rest. Her PCP saw her and put her on clinda for bronchitis and flagyl for bacterial vaginosis and also a short course of prednisone.  SOB briefly improved but then worsened later in the week. 3 days prior to admission she noticed right quads and hamstring tightness and myalgia that has persisted. Then on the evening prior to admission she started with  bilateral chest pain  Both anterior ly and posteriorly in her shoulders as well. This was severe and worsened with inspiration.   At arrival in  ER on 10/30/2012 11:02 PM  she did have a low grade temp of 100.1 but no other SIRS criteria and WBC was only 9.8. The ED began empiric treatment for PNA with rocephin and azithromycin and ordered blood cultures. Unfortunately the patients CT chest when obtained demonstrated multiple bilateral masses and nodules as well as mild hilar/mediastinal lymphadenopathy.   Of note, 2004 CT chest done for chest pain: ruled out PE and had clear infitlrates. In 2011 abd ct (for ovarian cyst rupture) lung cut at base was normal. Clearly current findings are new snice 2011  Also of note a) patient husband died in her arms few monthsa ago from MI and she is very stressed; b ) she also lost her step father recently   LINES / TUBES: None  CULTURES: ANTIBIOTICS: Anti-infectives     Start     Dose/Rate Route Frequency Ordered Stop   10/31/12 0030   cefTRIAXone (ROCEPHIN) 1 g in dextrose 5 % 50 mL  IVPB        1 g 100 mL/hr over 30 Minutes Intravenous  Once 10/31/12 0023 10/31/12 0205   10/31/12 0030   azithromycin (ZITHROMAX) 500 mg in dextrose 5 % 250 mL IVPB        500 mg 250 mL/hr over 60 Minutes Intravenous  Once 10/31/12 0023 10/31/12 0205         Urine leg 12/15>>neg Urine strep 12/15>>neg  Lab 11/02/12 0530 10/31/12 1333  PROCALCITON 0.11 <0.10     SIGNIFICANT EVENTS:  Admit 10/30/2012 11:02 PM   Reports coughing spell with yellow sputum & was dyspneic but OK now  VITAL SIGNS: Temp:  [98 F (36.7 C)-99 F (37.2 C)] 98.7 F (37.1 C) (12/17 0540) Pulse Rate:  [87-100] 97  (12/17 0540) Resp:  [16-20] 20  (12/17 0540) BP: (100-127)/(57-76) 110/66 mmHg (12/17 0540) SpO2:  [98 %-100 %] 100 % (12/17 0833)  PHYSICAL EXAMINATION:  BP 110/66  Pulse 97  Temp 98.7 F (37.1 C) (Oral)  Resp 20  Ht 4\' 11"  (1.499 m)  Wt 74.1 kg (163 lb 5.8 oz)  BMI 32.99 kg/m2  SpO2 100%  LMP 10/16/2012  General Appearance:    Alert, cooperative, no distress, appears stated age  Head:    Normocephalic, without obvious abnormality, atraumatic  Eyes:    PERRL,            Neck:   Supple, symmetrical, trachea  midline, no adenopathy;    thyroid:  no enlargement/tenderness/nodules;    or JVD     Lungs:     Clear to auscultation bilaterally, respirations unlabored  Chest Wall:    No tenderness or deformity   Heart:    Regular rate and rhythm, S1 and S2 normal, no murmur, rub   or gallop     Abdomen:     Soft, non-tender, bowel sounds active all four quadrants,   Genitalia:  Not examined     Extremities:   Extremities normal, atraumatic, no cyanosis or edema  Pulses:   2+ and symmetric all extremities  Skin:   Skin color, texture, turgor normal, no rashes or lesions     Neurologic:   CNII-XII intact, normal strength, sensation and reflexes    throughout       Lab 11/02/12 0530 11/01/12 0819 10/31/12 0504  NA 135 135 136  K 4.0 4.0 3.5  CL 100 100 99  CO2 27 27  26   BUN 11 11 11   CREATININE 0.86 0.85 0.69  GLUCOSE 100* 135* 157*    Lab 11/02/12 0530 10/31/12 0504 10/30/12 2314  HGB 10.3* 10.8* 11.8*  HCT 31.6* 32.0* 35.2*  WBC 10.1 9.6 9.8  PLT 271 277 306   Ct Soft Tissue Neck W Contrast  11/01/2012  *RADIOLOGY REPORT*  Clinical Data: Right-sided neck pain.  Bilateral pulmonary lesions. Low grade fever.  Rule out Lemierre syndrome  CT NECK WITH CONTRAST  Technique:  Multidetector CT imaging of the neck was performed with intravenous contrast.  Contrast: OMNIPAQUE IOHEXOL 300 MG/ML  SOLN  Comparison: None.  Findings: Small low density area in the left lower tonsillar region measuring approximately 6 mm.  This could be a small area of acute or chronic infection.  The surrounding soft tissues appear normal. Right-sided tonsillar tissue is normal.  Negative for jugular vein thrombosis or surrounding edema.  Negative for mass or adenopathy.  Parotid and submandibular glands are normal bilaterally.  No acute bony abnormality.  Bilateral lung lesions are present as described on the chest CT.  IMPRESSION: 6 mm low density area in the left tonsil which may represent acute or chronic infection.  No evidence of jugular vein thrombosis that would explain pulmonary septic emboli.   Original Report Authenticated By: Janeece Riggers, M.D.    CT Neck reviewed    ASSESSMENT / PLAN:  Patient Active Hospital Problem List: Multiple pulmonary nodules (10/31/2012)   Mediastinal adenopathy (10/31/2012)     Hilar adenopathy (10/31/2012)      Assessment and Plan   DDx is broad.   - Sarcoid  - most likely  due to age, and ethnicity , Low ACE does not rule this out.   - Septic Emboli - CT angio neck neg for thrombophlebitis of neck veins   - Mets -  Possible. But no evidence of primary on exam. AFP and HCG low. Rest of cancer workup per [facility]     - Autoimmune disease (Wegner, Lupus) - ANA, ANCA neg -   Plan:  - there are peripheral large lesions that are amenable  to CT guided bx and shoould help reduce false negative results. Scheduled for today - The various options of biopsy including bronchoscopy, CT guided needle aspiration and surgical biopsy were discussed.The risks of each procedure including coughing, bleeding and the  chances of lung puncture requiring chest tube were discussed in great detail. The benefits & alternatives including serial follow up were also discussed.  -  She can be discharged on 20 mg prednisone if uneventful procedure & can FU with me on Friday (needs appt).  Cyril Mourning MD. Tonny Bollman. [facility] Pager [phone] If no response call [phone]     .

## 2012-11-03 ENCOUNTER — Inpatient Hospital Stay (HOSPITAL_COMMUNITY): Payer: BC Managed Care – PPO

## 2012-11-03 ENCOUNTER — Ambulatory Visit (HOSPITAL_COMMUNITY): Payer: BC Managed Care – PPO

## 2012-11-03 MED ORDER — ALBUTEROL SULFATE (5 MG/ML) 0.5% IN NEBU: 2.5000 mg | INHALATION_SOLUTION | RESPIRATORY_TRACT | Status: DC | PRN

## 2012-11-03 MED ORDER — MIDAZOLAM HCL 2 MG/2ML IJ SOLN
INTRAMUSCULAR | Status: AC | PRN
  Administered 2012-11-03: 1 mg via INTRAVENOUS

## 2012-11-03 MED ORDER — HYDROCOD POLST-CHLORPHEN POLST 10-8 MG/5ML PO LQCR
5.0000 mL | Freq: Two times a day (BID) | ORAL | Status: DC | PRN
  Administered 2012-11-04 – 2012-11-05 (×3): 5 mL via ORAL
  Filled 2012-11-03 (×3): qty 5

## 2012-11-03 MED ORDER — SODIUM CHLORIDE 0.9 % IV SOLN
INTRAVENOUS | Status: DC | PRN
  Administered 2012-11-03: 50 mL/h via INTRAVENOUS

## 2012-11-03 MED ORDER — FENTANYL CITRATE 0.05 MG/ML IJ SOLN
INTRAMUSCULAR | Status: AC | PRN
  Administered 2012-11-03: 50 ug via INTRAVENOUS

## 2012-11-03 NOTE — Progress Notes (Signed)
PULMONARY  / CRITICAL CARE MEDICINE  Name: Lauren Russo MRN: [id number] DOB: [birth year]    LOS: 4 Date of admit : 10/30/2012 11:02 PM   REFERRING PROVIDER:  Dr Doran Stabler  CHIEF COMPLAINT:  Dyspnea, pulm nodules  HPI Lauren Russo is a 41 y.o. female who presented initially with right sided neck pain posteriorly. She quickly was overwhelmed with  cough and SOB. Symptoms onset was acute and 5 days prior to admission. Dyspnea rated as severe and progressive. She was very dyspneic with exertion and relieved by rest. Her PCP saw her and put her on clinda for bronchitis and flagyl for bacterial vaginosis and also a short course of prednisone.  SOB briefly improved but then worsened later in the week. 3 days prior to admission she noticed right quads and hamstring tightness and myalgia that has persisted. Then on the evening prior to admission she started with  bilateral chest pain  Both anterior ly and posteriorly in her shoulders as well. This was severe and worsened with inspiration.   At arrival in  ER on 10/30/2012 11:02 PM  she did have a low grade temp of 100.1 but no other SIRS criteria and WBC was only 9.8. The ED began empiric treatment for PNA with rocephin and azithromycin and ordered blood cultures. Unfortunately the patients CT chest when obtained demonstrated multiple bilateral masses and nodules as well as mild hilar/mediastinal lymphadenopathy.   Of note, 2004 CT chest done for chest pain: ruled out PE and had clear infitlrates. In 2011 abd ct (for ovarian cyst rupture) lung cut at base was normal. Clearly current findings are new snice 2011   LINES / TUBES: None  CULTURES:  Urine leg 12/15>>neg Urine strep 12/15>>neg  Lab 11/02/12 0530 10/31/12 1333  PROCALCITON 0.11 <0.10     SIGNIFICANT EVENTS:  Admit 10/30/2012 11:02 PM   Reports coughing spell  Biopsy cancelled yesterday C/o neck pain  - better  VITAL SIGNS: Temp:  [98.5 F (36.9 C)-99.1 F  (37.3 C)] 98.8 F (37.1 C) (12/18 0501) Pulse Rate:  [86-102] 86  (12/18 0501) Resp:  [18-20] 18  (12/18 0501) BP: (99-115)/(70-80) 99/80 mmHg (12/18 0501) SpO2:  [94 %-100 %] 100 % (12/18 0501)  PHYSICAL EXAMINATION:  BP 99/80  Pulse 86  Temp 98.8 F (37.1 C) (Oral)  Resp 18  Ht 4\' 11"  (1.499 m)  Wt 74.1 kg (163 lb 5.8 oz)  BMI 32.99 kg/m2  SpO2 100%  LMP 10/16/2012  General Appearance:    Alert, cooperative, no distress, appears stated age  Head:    Normocephalic, without obvious abnormality, atraumatic  Eyes:    PERRL,            Neck:   Supple, symmetrical, trachea midline, no adenopathy;    thyroid:  no enlargement/tenderness/nodules;    or JVD     Lungs:     Clear to auscultation bilaterally, respirations unlabored  Chest Wall:    No tenderness or deformity   Heart:    Regular rate and rhythm, S1 and S2 normal, no murmur, rub   or gallop     Abdomen:     Soft, non-tender, bowel sounds active all four quadrants,   Genitalia:  Not examined     Extremities:   Extremities normal, atraumatic, no cyanosis or edema  Pulses:   2+ and symmetric all extremities  Skin:   Skin color, texture, turgor normal, no rashes or lesions     Neurologic:   CNII-XII  intact, normal strength, sensation and reflexes    throughout       Lab 11/02/12 0530 11/01/12 0819 10/31/12 0504  NA 135 135 136  K 4.0 4.0 3.5  CL 100 100 99  CO2 27 27 26   BUN 11 11 11   CREATININE 0.86 0.85 0.69  GLUCOSE 100* 135* 157*    Lab 11/02/12 0530 10/31/12 0504 10/30/12 2314  HGB 10.3* 10.8* 11.8*  HCT 31.6* 32.0* 35.2*  WBC 10.1 9.6 9.8  PLT 271 277 306   Ct Soft Tissue Neck W Contrast  11/01/2012  *RADIOLOGY REPORT*  Clinical Data: Right-sided neck pain.  Bilateral pulmonary lesions. Low grade fever.  Rule out Lemierre syndrome  CT NECK WITH CONTRAST  Technique:  Multidetector CT imaging of the neck was performed with intravenous contrast.  Contrast: OMNIPAQUE IOHEXOL 300 MG/ML  SOLN   Comparison: None.  Findings: Small low density area in the left lower tonsillar region measuring approximately 6 mm.  This could be a small area of acute or chronic infection.  The surrounding soft tissues appear normal. Right-sided tonsillar tissue is normal.  Negative for jugular vein thrombosis or surrounding edema.  Negative for mass or adenopathy.  Parotid and submandibular glands are normal bilaterally.  No acute bony abnormality.  Bilateral lung lesions are present as described on the chest CT.  IMPRESSION: 6 mm low density area in the left tonsil which may represent acute or chronic infection.  No evidence of jugular vein thrombosis that would explain pulmonary septic emboli.   Original Report Authenticated By: Janeece Riggers, M.D.    CT Neck reviewed    ASSESSMENT / PLAN:  Patient Active Hospital Problem List: Multiple pulmonary nodules (10/31/2012)   Mediastinal adenopathy (10/31/2012)     Hilar adenopathy (10/31/2012)      Assessment and Plan   DDx is broad.   - Sarcoid  - most likely  due to age, and ethnicity , Low ACE does not rule this out.   - Septic Emboli - CT angio neck neg for thrombophlebitis of neck veins   - Mets -  Possible. But no evidence of primary on exam. AFP and HCG low. Mammogram as outpt               - Autoimmune disease (Wegner, Lupus) - ANA, ANCA neg -   Plan:  - there are peripheral large lesions that are amenable to CT guided bx and shoould help reduce false negative results. Rescheduled for today - The various options of biopsy including bronchoscopy, CT guided needle aspiration and surgical biopsy were discussed.The risks of each procedure including coughing, bleeding and the  chances of lung puncture requiring chest tube were discussed in great detail. The benefits & alternatives including serial follow up were also discussed.  - She can be discharged on 20 mg prednisone if uneventful procedure & can FU with [place] next week - appt made. PCCM to sign off    Cyril Mourning MD. Tonny Bollman. [facility] Pager [phone] If no response call [phone]     .

## 2012-11-03 NOTE — Progress Notes (Signed)
Patient ID: Lauren Russo, female   DOB: 06/25/1971, 41 y.o.   MRN: [id number]  [facility] PROGRESS NOTE  Lauren Russo WUJ:[id number] DOB: December 20, 1970 DOA: 10/30/2012 PCP: Quitman Livings, MD  Brief narrative: 41 year old woman with 5 day history of cough, SOB, pleuritic pain and DOE treated as outpatient for bronchitis, presented  ASSESSMENT AND PLAN: 1. Pleuritic chest pain/SOB--presumed secondary to pulmonary disease. Cardiac enzymes negative, EKG unremarkable. Discussed with radiologist Dr. Eppie Gibson 12/15--although not a dedicated PE study, he feels confident there is no PE. Echocardiogram still pending  2. Bilateral lung nodules/masses--ddx includes sarcoidosis, metastatic disease (less likely). Lemierre's ruled out by CT neck. No evidence to suggest infection, remains afebrile, Blood cultures NGTD. Pulmonary consult appreciated. Biopsy scheduled today.  3. Normocytic anemia--follow-up as outpatient. 4. Random hyperglycemia-evaluation can be deferred at this point.  Consultants:  PCCM  Procedures:  12/15 echocardiogram--Left ventricle: The cavity size was normal. Systolic function was normal. Wall motion was normal; there were no regional wall motion abnormalities.  12/16 CT-guided lung biopsy  Antibiotics:  None  Code Status: Full Family Communication: Pt at bedside Disposition Plan: Home in AM  HPI/Subjective: No events overnight.   Objective: Filed Vitals:   11/02/12 0833 11/02/12 1401 11/02/12 2119 11/03/12 0501  BP:  114/70 115/73 99/80  Pulse:  99 102 86  Temp:  98.5 F (36.9 C) 99.1 F (37.3 C) 98.8 F (37.1 C)  TempSrc:  Oral Oral Oral  Resp:  20 20 18   Height:      Weight:      SpO2: 100% 100% 94% 100%    Intake/Output Summary (Last 24 hours) at 11/03/12 1331 Last data filed at 11/02/12 1647  Gross per 24 hour  Intake    240 ml  Output      0 ml  Net    240 ml    Exam:   General:  Pt is alert, follows commands appropriately, not in  acute distress  Cardiovascular: Regular rate and rhythm, S1/S2, no murmurs, no rubs, no gallops  Respiratory: Clear to auscultation bilaterally, bibasilar rales  Abdomen: Soft, non tender, non distended, bowel sounds present, no guarding  Extremities: No edema, pulses DP and PT palpable bilaterally  Neuro: Grossly nonfocal  Data Reviewed: Basic Metabolic Panel:  Lab 11/02/12 9562 11/01/12 0819 10/31/12 0504 10/30/12 2314  NA 135 135 136 132*  K 4.0 4.0 3.5 3.7  CL 100 100 99 95*  CO2 27 27 26 26   GLUCOSE 100* 135* 157* 109*  BUN 11 11 11 16   CREATININE 0.86 0.85 0.69 0.85  CALCIUM 9.0 9.3 8.8 9.5  MG -- -- -- --  PHOS -- -- -- --   Liver Function Tests:  Lab 10/31/12 1330  AST 23  ALT 33  ALKPHOS 71  BILITOT 0.2*  PROT 6.8  ALBUMIN 3.1*   CBC:  Lab 11/02/12 0530 10/31/12 0504 10/30/12 2314  WBC 10.1 9.6 9.8  NEUTROABS -- -- 6.9  HGB 10.3* 10.8* 11.8*  HCT 31.6* 32.0* 35.2*  MCV 86.8 86.3 86.3  PLT 271 277 306   Cardiac Enzymes:  Lab 10/31/12 1332 10/30/12 2314  CKTOTAL 49 --  CKMB -- --  CKMBINDEX -- --  TROPONINI -- <0.30    Recent Results (from the past 240 hour(s))  CULTURE, BLOOD (ROUTINE X 2)     Status: Normal (Preliminary result)   Collection Time   10/31/12  2:15 AM      Component Value Range Status Comment   Specimen Description  BLOOD RIGHT ANTECUBITAL   Final    Special Requests BOTTLES DRAWN AEROBIC AND ANAEROBIC 4.5CC   Final    Culture  Setup Time 10/31/2012 16:07   Final    Culture     Final    Value:        BLOOD CULTURE RECEIVED NO GROWTH TO DATE CULTURE WILL BE HELD FOR 5 DAYS BEFORE ISSUING A FINAL NEGATIVE REPORT   Report Status PENDING   Incomplete   CULTURE, BLOOD (ROUTINE X 2)     Status: Normal (Preliminary result)   Collection Time   10/31/12  2:28 AM      Component Value Range Status Comment   Specimen Description BLOOD LEFT HAND   Final    Special Requests BOTTLES DRAWN AEROBIC AND ANAEROBIC 4.5CC   Final    Culture   Setup Time 10/31/2012 16:07   Final    Culture     Final    Value:        BLOOD CULTURE RECEIVED NO GROWTH TO DATE CULTURE WILL BE HELD FOR 5 DAYS BEFORE ISSUING A FINAL NEGATIVE REPORT   Report Status PENDING   Incomplete      Scheduled Meds:   . guaiFENesin  600 mg Oral BID  . hydrochlorothiazide  12.5 mg Oral Daily  . lisinopril  20 mg Oral Daily  . sodium chloride  3 mL Intravenous Q12H  . vitamin C  500 mg Oral Daily   Continuous Infusions:    [name], MD  [facility] Pager [phone]  If 7PM-7AM, please contact night-coverage [email or web address] Password [email or web address] 11/03/2012, 1:31 PM   LOS: 4 days

## 2012-11-03 NOTE — Procedures (Signed)
Technically successful CT guided biopsy of right lower lobe pulmonary mass.  No immediate post procedural complications.

## 2012-11-04 LAB — CBC
MCH: 28.8 pg (ref 26.0–34.0)
MCV: 86.1 fL (ref 78.0–100.0)
Platelets: 292 10*3/uL (ref 150–400)
RBC: 3.68 MIL/uL — ABNORMAL LOW (ref 3.87–5.11)
RDW: 13.8 % (ref 11.5–15.5)
WBC: 9.3 10*3/uL (ref 4.0–10.5)

## 2012-11-04 LAB — BASIC METABOLIC PANEL
CO2: 27 mEq/L (ref 19–32)
Calcium: 9.2 mg/dL (ref 8.4–10.5)
Chloride: 99 mEq/L (ref 96–112)
Creatinine, Ser: 0.8 mg/dL (ref 0.50–1.10)
GFR calc Af Amer: >90 mL/min (ref >90)
Sodium: 133 mEq/L — ABNORMAL LOW (ref 135–145)

## 2012-11-04 NOTE — Progress Notes (Signed)
Patient ID: Lauren Russo, female   DOB: [birth year], 41 y.o.   MRN: [id number]  [facility] PROGRESS NOTE  Lauren Russo WUJ:[id number] DOB: 1971/03/31 DOA: 10/30/2012 PCP: Quitman Livings, MD  Brief narrative:  41 year old woman with 5 day history of cough, SOB, pleuritic pain and DOE treated as outpatient for bronchitis, presented to [facility] ED for further evaluation of worsening symptoms.   ASSESSMENT AND PLAN:  1. Pleuritic chest pain/SOB--presumed secondary to pulmonary disease. Cardiac enzymes negative, EKG unremarkable. Discussed with radiologist Dr. Eppie Gibson 12/15--although not a dedicated PE study, he feels confident there is no PE. Echocardiogram still pending and will continue to follow upon results.Add tussionex for cough control. 2. Bilateral lung nodules/masses--ddx includes sarcoidosis, metastatic disease (less likely). Lemierre's ruled out by CT neck. No evidence to suggest infection, remains afebrile, Blood cultures NGTD. Pulmonary consult appreciated. Biopsy scheduled today.  3. Normocytic anemia--follow-up as outpatient. 4. Random hyperglycemia-evaluation can be deferred at this point.  Consultants:  PCCM Procedures:  12/15 echocardiogram--Left ventricle: The cavity size was normal. Systolic function was normal. Wall motion was normal; there were no regional wall motion abnormalities.  12/16 CT-guided lung biopsy Antibiotics:  None  Code Status: Full  Family Communication: Pt at bedside  Disposition Plan: Home in AM   HPI/Subjective: No events overnight.   Objective: Filed Vitals:   11/03/12 1636 11/03/12 1700 11/03/12 2213 11/04/12 0555  BP: 124/72 109/69 104/71 103/58  Pulse: 123 112 93 96  Temp:   98.6 F (37 C) 98.6 F (37 C)  TempSrc:   Oral Oral  Resp:   20 16  Height:      Weight:      SpO2:   100% 98%    Intake/Output Summary (Last 24 hours) at 11/04/12 1226 Last data filed at 11/03/12 1750  Gross per 24 hour  Intake    120 ml  Output       0 ml  Net    120 ml    Exam:   General:  Pt is alert, follows commands appropriately, not in acute distress  Cardiovascular: Regular rhythm, tachycardic, S1/S2, no murmurs, no rubs, no gallops  Respiratory: Clear to auscultation bilaterally, no wheezing, no crackles, no rhonchi  Abdomen: Soft, non tender, non distended, bowel sounds present, no guarding  Extremities: No edema, pulses DP and PT palpable bilaterally  Neuro: Grossly nonfocal  Data Reviewed: Basic Metabolic Panel:  Lab 11/04/12 9562 11/02/12 0530 11/01/12 0819 10/31/12 0504 10/30/12 2314  NA 133* 135 135 136 132*  K 3.7 4.0 4.0 3.5 3.7  CL 99 100 100 99 95*  CO2 27 27 27 26 26   GLUCOSE 117* 100* 135* 157* 109*  BUN 13 11 11 11 16   CREATININE 0.80 0.86 0.85 0.69 0.85  CALCIUM 9.2 9.0 9.3 8.8 9.5  MG -- -- -- -- --  PHOS -- -- -- -- --   Liver Function Tests:  Lab 10/31/12 1330  AST 23  ALT 33  ALKPHOS 71  BILITOT 0.2*  PROT 6.8  ALBUMIN 3.1*   CBC:  Lab 11/04/12 0512 11/02/12 0530 10/31/12 0504 10/30/12 2314  WBC 9.3 10.1 9.6 9.8  NEUTROABS -- -- -- 6.9  HGB 10.6* 10.3* 10.8* 11.8*  HCT 31.7* 31.6* 32.0* 35.2*  MCV 86.1 86.8 86.3 86.3  PLT 292 271 277 306   Cardiac Enzymes:  Lab 10/31/12 1332 10/30/12 2314  CKTOTAL 49 --  CKMB -- --  CKMBINDEX -- --  TROPONINI -- <0.30     Recent  Results (from the past 240 hour(s))  CULTURE, BLOOD (ROUTINE X 2)     Status: Normal (Preliminary result)   Collection Time   10/31/12  2:15 AM      Component Value Range Status Comment   Specimen Description BLOOD RIGHT ANTECUBITAL   Final    Special Requests BOTTLES DRAWN AEROBIC AND ANAEROBIC 4.5CC   Final    Culture  Setup Time 10/31/2012 16:07   Final    Culture     Final    Value:        BLOOD CULTURE RECEIVED NO GROWTH TO DATE CULTURE WILL BE HELD FOR 5 DAYS BEFORE ISSUING A FINAL NEGATIVE REPORT   Report Status PENDING   Incomplete   CULTURE, BLOOD (ROUTINE X 2)     Status: Normal (Preliminary  result)   Collection Time   10/31/12  2:28 AM      Component Value Range Status Comment   Specimen Description BLOOD LEFT HAND   Final    Special Requests BOTTLES DRAWN AEROBIC AND ANAEROBIC 4.5CC   Final    Culture  Setup Time 10/31/2012 16:07   Final    Culture     Final    Value:        BLOOD CULTURE RECEIVED NO GROWTH TO DATE CULTURE WILL BE HELD FOR 5 DAYS BEFORE ISSUING A FINAL NEGATIVE REPORT   Report Status PENDING   Incomplete   TISSUE CULTURE     Status: Normal (Preliminary result)   Collection Time   11/03/12  2:42 PM      Component Value Range Status Comment   Specimen Description OTHER   Final    Special Requests Normal   Final    Gram Stain     Final    Value: NO WBC SEEN     NO SQUAMOUS EPITHELIAL CELLS SEEN     NO ORGANISMS SEEN   Culture NO GROWTH 1 DAY   Final    Report Status PENDING   Incomplete      Scheduled Meds:   . guaiFENesin  600 mg Oral BID  . hydrochlorothiazide  12.5 mg Oral Daily  . sodium chloride  3 mL Intravenous Q12H  . vitamin C  500 mg Oral Daily   Continuous Infusions:   . sodium chloride 50 mL/hr (11/03/12 1437)     [name], MD  [facility] Pager [phone]  If 7PM-7AM, please contact night-coverage [email or web address] Password [email or web address] 11/04/2012, 12:26 PM   LOS: 5 days

## 2012-11-05 MED ORDER — ACETAMINOPHEN 325 MG PO TABS: 650.0000 mg | ORAL_TABLET | Freq: Four times a day (QID) | ORAL | Status: DC | PRN

## 2012-11-05 MED ORDER — HYDROCODONE-ACETAMINOPHEN 5-325 MG PO TABS: 1.0000 | ORAL_TABLET | Freq: Four times a day (QID) | ORAL | Status: DC | PRN

## 2012-11-05 MED ORDER — HYDROCHLOROTHIAZIDE 12.5 MG PO CAPS: 12.5000 mg | ORAL_CAPSULE | Freq: Every day | ORAL | Status: DC

## 2012-11-05 MED ORDER — HYDROCOD POLST-CHLORPHEN POLST 10-8 MG/5ML PO LQCR: 5.0000 mL | Freq: Two times a day (BID) | ORAL | Status: DC | PRN

## 2012-11-05 MED ORDER — ALPRAZOLAM 1 MG PO TABS: 1.0000 mg | ORAL_TABLET | Freq: Three times a day (TID) | ORAL | Status: DC | PRN

## 2012-11-05 MED ORDER — GUAIFENESIN ER 600 MG PO TB12: 600.0000 mg | ORAL_TABLET | Freq: Two times a day (BID) | ORAL | Status: DC

## 2012-11-05 MED ORDER — LIDOCAINE 5 % EX PTCH: 1.0000 | MEDICATED_PATCH | CUTANEOUS | Status: DC

## 2012-11-05 MED ORDER — PREDNISONE 20 MG PO TABS: 20.0000 mg | ORAL_TABLET | Freq: Every day | ORAL | Status: DC

## 2012-11-05 NOTE — Discharge Summary (Signed)
Physician Discharge Summary  Lauren Russo ZOX:[id number] DOB: March 12, 1971 DOA: 10/30/2012  PCP: Quitman Livings, MD  Admit date: 10/30/2012 Discharge date: 11/05/2012  Recommendations for Outpatient Follow-up:  1. Pt will need to follow up with PCP in 2-3 weeks post discharge 2. Please obtain BMP to evaluate electrolytes and kidney function 3. Please also check CBC to evaluate Hg and Hct levels 4. Please note that pt will need to follow up with pulmonologist and the appointment has been scheduled 5. Will ned to discuss biopsy results with pt  Discharge Diagnoses: Multiple pulmonary nodules, ? Sarcoidosis  Principal Problem:  *Multiple pulmonary nodules Active Problems:  Lung mass  Chest pain  Normocytic anemia  Hyperglycemia  Mediastinal adenopathy  Hilar adenopathy  Discharge Condition: Stable  Diet recommendation: Heart healthy diet discussed in details    Brief narrative:  41 year old woman with 5 day history of cough, SOB, pleuritic pain and DOE treated as outpatient for bronchitis, presented to [facility] ED for further evaluation of worsening symptoms.  ASSESSMENT AND PLAN:  1. Pleuritic chest pain/SOB--presumed secondary to pulmonary disease. Cardiac enzymes negative, EKG unremarkable. Discussed with radiologist Dr. Eppie Gibson 12/15--although not a dedicated PE study, he feels confident there is no PE. Echocardiogram still pending and will continue to follow upon results.Add tussionex for cough control. 2. Bilateral lung nodules/masses--ddx includes sarcoidosis, metastatic disease (less likely). Lemierre's ruled out by CT neck. No evidence to suggest infection, remains afebrile, Blood cultures NGTD. Pulmonary consult appreciated. Biopsy done and will need to follow up on results. 3. Normocytic anemia--follow-up as outpatient. 4. Random hyperglycemia-evaluation can be deferred at this point. Consultants:  PCCM Procedures:  12/15 echocardiogram--Left ventricle: The cavity size was  normal. Systolic function was normal. Wall motion was normal; there were no regional wall motion abnormalities.  12/16 CT-guided lung biopsy Antibiotics:  None    Discharge Exam: Filed Vitals:   11/05/12 0446  BP: 109/62  Pulse: 100  Temp: 99.9 F (37.7 C)  Resp: 16   Filed Vitals:   11/04/12 0555 11/04/12 1400 11/04/12 2241 11/05/12 0446  BP: 103/58 109/77 106/66 109/62  Pulse: 96 98 102 100  Temp: 98.6 F (37 C) 98.7 F (37.1 C) 99.5 F (37.5 C) 99.9 F (37.7 C)  TempSrc: Oral Oral Oral Oral  Resp: 16 17 16 16   Height:      Weight:      SpO2: 98% 99% 100% 94%    General: Pt is alert, follows commands appropriately, not in acute distress Cardiovascular: Regular rate and rhythm, S1/S2 +, no murmurs, no rubs, no gallops Respiratory: Clear to auscultation bilaterally, no wheezing, no crackles, no rhonchi Abdominal: Soft, non tender, non distended, bowel sounds +, no guarding Extremities: no edema, no cyanosis, pulses palpable bilaterally DP and PT Neuro: Grossly nonfocal  Discharge Instructions  Discharge Orders    Future Appointments: Provider: Department: Dept Phone: Center:   11/09/2012 9:30 AM Julio Sicks, NP [facility] [phone] None     Future Orders Please Complete By Expires   Diet - low sodium heart healthy      Increase activity slowly          Medication List     As of 11/05/2012 10:10 AM    STOP taking these medications         lisinopril-hydrochlorothiazide 20-12.5 MG per tablet   Commonly known as: PRINZIDE,ZESTORETIC      TAKE these medications         acetaminophen 325 MG tablet   Commonly  known as: TYLENOL   Take 2 tablets (650 mg total) by mouth every 6 (six) hours as needed.      ALPRAZolam 1 MG tablet   Commonly known as: XANAX   Take 1 tablet (1 mg total) by mouth 3 (three) times daily as needed for anxiety.      chlorpheniramine-HYDROcodone 10-8 MG/5ML Lqcr   Commonly known as: TUSSIONEX   Take 5 mLs by  mouth every 12 (twelve) hours as needed.      guaiFENesin 600 MG 12 hr tablet   Commonly known as: MUCINEX   Take 1 tablet (600 mg total) by mouth 2 (two) times daily.      hydrochlorothiazide 12.5 MG capsule   Commonly known as: MICROZIDE   Take 1 capsule (12.5 mg total) by mouth daily.      HYDROcodone-acetaminophen 5-325 MG per tablet   Commonly known as: NORCO/VICODIN   Take 1-2 tablets by mouth every 6 (six) hours as needed. For pain      lidocaine 5 %   Commonly known as: LIDODERM   Place 1 patch onto the skin daily. Remove & Discard patch within 12 hours or as directed by MD      predniSONE 20 MG tablet   Commonly known as: DELTASONE   Take 1 tablet (20 mg total) by mouth daily.      vitamin C 500 MG tablet   Commonly known as: ASCORBIC ACID   Take 500 mg by mouth daily.           Follow-up Information    Follow up with PARRETT,TAMMY, NP. On 11/09/2012. (9.30 a)    Contact information:   [facility] [address] [place] [address] [phone]           The results of significant diagnostics from this hospitalization (including imaging, microbiology, ancillary and laboratory) are listed below for reference.     Microbiology: Recent Results (from the past 240 hour(s))  CULTURE, BLOOD (ROUTINE X 2)     Status: Normal (Preliminary result)   Collection Time   10/31/12  2:15 AM      Component Value Range Status Comment   Specimen Description BLOOD RIGHT ANTECUBITAL   Final    Special Requests BOTTLES DRAWN AEROBIC AND ANAEROBIC 4.5CC   Final    Culture  Setup Time 10/31/2012 16:07   Final    Culture     Final    Value:        BLOOD CULTURE RECEIVED NO GROWTH TO DATE CULTURE WILL BE HELD FOR 5 DAYS BEFORE ISSUING A FINAL NEGATIVE REPORT   Report Status PENDING   Incomplete   CULTURE, BLOOD (ROUTINE X 2)     Status: Normal (Preliminary result)   Collection Time   10/31/12  2:28 AM      Component Value Range Status Comment   Specimen  Description BLOOD LEFT HAND   Final    Special Requests BOTTLES DRAWN AEROBIC AND ANAEROBIC 4.5CC   Final    Culture  Setup Time 10/31/2012 16:07   Final    Culture     Final    Value:        BLOOD CULTURE RECEIVED NO GROWTH TO DATE CULTURE WILL BE HELD FOR 5 DAYS BEFORE ISSUING A FINAL NEGATIVE REPORT   Report Status PENDING   Incomplete   TISSUE CULTURE     Status: Normal (Preliminary result)   Collection Time   11/03/12  2:42 PM  Component Value Range Status Comment   Specimen Description OTHER   Final    Special Requests Normal   Final    Gram Stain     Final    Value: NO WBC SEEN     NO SQUAMOUS EPITHELIAL CELLS SEEN     NO ORGANISMS SEEN   Culture NO GROWTH 2 DAYS   Final    Report Status PENDING   Incomplete      Labs: Basic Metabolic Panel:  Lab 11/04/12 1610 11/02/12 0530 11/01/12 0819 10/31/12 0504 10/30/12 2314  NA 133* 135 135 136 132*  K 3.7 4.0 4.0 3.5 3.7  CL 99 100 100 99 95*  CO2 27 27 27 26 26   GLUCOSE 117* 100* 135* 157* 109*  BUN 13 11 11 11 16   CREATININE 0.80 0.86 0.85 0.69 0.85  CALCIUM 9.2 9.0 9.3 8.8 9.5  MG -- -- -- -- --  PHOS -- -- -- -- --   Liver Function Tests:  Lab 10/31/12 1330  AST 23  ALT 33  ALKPHOS 71  BILITOT 0.2*  PROT 6.8  ALBUMIN 3.1*   No results found for this basename: LIPASE:5,AMYLASE:5 in the last 168 hours No results found for this basename: AMMONIA:5 in the last 168 hours CBC:  Lab 11/04/12 0512 11/02/12 0530 10/31/12 0504 10/30/12 2314  WBC 9.3 10.1 9.6 9.8  NEUTROABS -- -- -- 6.9  HGB 10.6* 10.3* 10.8* 11.8*  HCT 31.7* 31.6* 32.0* 35.2*  MCV 86.1 86.8 86.3 86.3  PLT 292 271 277 306   Cardiac Enzymes:  Lab 10/31/12 1332 10/30/12 2314  CKTOTAL 49 --  CKMB -- --  CKMBINDEX -- --  TROPONINI -- <0.30   BNP: BNP (last 3 results) No results found for this basename: PROBNP:3 in the last 8760 hours CBG: No results found for this basename: GLUCAP:5 in the last 168 hours   SIGNED: Time coordinating  discharge: Over 30 minutes  [name], MD  [facility] 11/05/2012, 10:10 AM Pager [phone]  If 7PM-7AM, please contact night-coverage [email or web address] Password [email or web address]

## 2012-11-06 LAB — TISSUE CULTURE
Gram Stain: NONE SEEN
Special Requests: NORMAL

## 2012-11-06 LAB — CULTURE, BLOOD (ROUTINE X 2): Culture: NO GROWTH

## 2012-11-09 ENCOUNTER — Ambulatory Visit (INDEPENDENT_AMBULATORY_CARE_PROVIDER_SITE_OTHER)
Admission: RE | Admit: 2012-11-09 | Discharge: 2012-11-09 | Disposition: A | Discharge status: Home / Self Care | Payer: BC Managed Care – PPO | Source: Ambulatory Visit | Attending: Adult Health | Admitting: Adult Health

## 2012-11-09 ENCOUNTER — Encounter: Payer: Self-pay | Admitting: Adult Health

## 2012-11-09 ENCOUNTER — Ambulatory Visit (INDEPENDENT_AMBULATORY_CARE_PROVIDER_SITE_OTHER): Payer: BC Managed Care – PPO | Admitting: Adult Health

## 2012-11-09 VITALS — BP 116/80 | HR 92 | Temp 98.0°F | Ht 59.0 in | Wt 161.2 lb

## 2012-11-09 DIAGNOSIS — J99 Respiratory disorders in diseases classified elsewhere: Secondary | ICD-10-CM

## 2012-11-09 DIAGNOSIS — D860 Sarcoidosis of lung: Secondary | ICD-10-CM

## 2012-11-09 DIAGNOSIS — R918 Other nonspecific abnormal finding of lung field: Secondary | ICD-10-CM

## 2012-11-09 DIAGNOSIS — D869 Sarcoidosis, unspecified: Secondary | ICD-10-CM

## 2012-11-09 NOTE — Progress Notes (Signed)
Quick Note:  Called spoke with patient, advised of cxr results / recs as stated by TP. Pt verbalized her understanding and denied any questions. ______ 

## 2012-11-09 NOTE — Patient Instructions (Addendum)
Continue on Prednisone 10mg  daily  Follow up Dr. Vassie Loll  In 3 weeks with PFTs  Please contact office for sooner follow up if symptoms do not improve or worsen or seek emergency care

## 2012-11-16 DIAGNOSIS — D860 Sarcoidosis of lung: Secondary | ICD-10-CM | POA: Insufficient documentation

## 2012-11-16 NOTE — Assessment & Plan Note (Signed)
Recent hospitalization with CT revealing bilateral lung masses and nodules with mild hilar and mediastinal lymphadenopathy CT-guided lung biopsy showing nonnecrotizing granulomas with no evidence of malignancy This is consistent with sarcoid. Patient will continue on steroids for, now. We'll have her return in 2 weeks with a pulmonary function test. She will maintain on prednisone 10 mg daily and hold at this dose until seen back in, the office in 2-3 week

## 2012-11-16 NOTE — Progress Notes (Signed)
  Subjective:    Patient ID: Lauren Russo, female    DOB: May 08, 1971, 41 y.o.   MRN: [id number]  HPI 41 yo AAF seen for initial pulmonary consult for pulmonary nodules. During hospitalization. 10/30/2012  11/09/12 Post Hospital follow up  Patient was admitted December 14.-11/05/2012 for multiple pulmonary nodules and shortness of breath. On initial presentation had low-grade fever w/ SIRS. She was treated  empirically for possible pneumonia. CT chest showed multiple bilateral lung masses and nodules with mild hilar and mediastinal lymphadenopathy Patient underwent a CT guided lung biopsy that showed non-necrotizing granulomas with no evidence of malignancy . 2-D echo showed normal left ventricular function She was discharged on prednisone 20 mg daily Since discharge. Patient reports that she is not feeling much improved. Continues to have cough with occasional thick mucus. She also continues to have pleuritic pain, especially with coughing She denies any fever or orthopnea, PND, or leg swelling She has no frank hemoptysis    Review of Systems Constitutional:   No  weight loss, night sweats,  Fevers, chills,  +fatigue, or  lassitude.  HEENT:   No headaches,  Difficulty swallowing,  Tooth/dental problems, or  Sore throat,                No sneezing, itching, ear ache, nasal congestion, post nasal drip,   CV:  No chest pain,  Orthopnea, PND, swelling in lower extremities, anasarca, dizziness, palpitations, syncope.   GI  No heartburn, indigestion, abdominal pain, nausea, vomiting, diarrhea, change in bowel habits, loss of appetite, bloody stools.   Resp: ,   No chest wall deformity  Skin: no rash or lesions.  GU: no dysuria, change in color of urine, no urgency or frequency.  No flank pain, no hematuria   MS:  No joint pain or swelling.  No decreased range of motion.  No back pain.  Psych:  No change in mood or affect. No depression or anxiety.  No memory loss.           Objective:   Physical Exam GEN: A/Ox3; pleasant , NAD, well nourished   HEENT:  Levasy/AT,  EACs-clear, TMs-wnl, NOSE-clear, THROAT-clear, no lesions, no postnasal drip or exudate noted.   NECK:  Supple w/ fair ROM; no JVD; normal carotid impulses w/o bruits; no thyromegaly or nodules palpated; no lymphadenopathy.  RESP  Clear  P & A; w/o, wheezes/ rales/ or rhonchi.no accessory muscle use, no dullness to percussion  CARD:  RRR, no m/r/g  , no peripheral edema, pulses intact, no cyanosis or clubbing.  GI:   Soft & nt; nml bowel sounds; no organomegaly or masses detected.  Musco: Warm bil, no deformities or joint swelling noted.   Neuro: alert, no focal deficits noted.    Skin: Warm, no lesions or rashes         Assessment & Plan:

## 2012-12-02 ENCOUNTER — Telehealth: Payer: Self-pay | Admitting: Pulmonary Disease

## 2012-12-02 MED ORDER — PREDNISONE 10 MG PO TABS: 10.0000 mg | ORAL_TABLET | Freq: Every day | ORAL | Status: DC

## 2012-12-02 NOTE — Telephone Encounter (Signed)
Pt is aware of TP recs and she will keep ov in a few weeks with RA and PFT. RX for Prednisone 10mg  sent to the pharmacy.

## 2012-12-02 NOTE — Telephone Encounter (Signed)
The pt is needing a refill for the Prednisone and says she has been taking 20ng a day but the last OV note on 11/09/12 says the pt is on 10mg .  The pt is also concerned with her diet and says the Web says there are certain foods she should not eat with sarcoidosis. I have never heard of this before. TP pls advise.No Known Allergies

## 2012-12-02 NOTE — Telephone Encounter (Signed)
She suppose to be on prednisone 10mg  daily  Please have her decrease her dose to 10mg  daily  And hold at this dose until seen back in office with ALva in couple of weeks  Diet right now should be low sweet due to steroids  Please contact office for sooner follow up if symptoms do not improve or worsen or seek emergency care

## 2012-12-20 ENCOUNTER — Ambulatory Visit: Payer: BC Managed Care – PPO | Admitting: Pulmonary Disease

## 2013-01-01 ENCOUNTER — Other Ambulatory Visit: Payer: Self-pay

## 2013-01-13 ENCOUNTER — Ambulatory Visit (INDEPENDENT_AMBULATORY_CARE_PROVIDER_SITE_OTHER)
Admission: RE | Admit: 2013-01-13 | Discharge: 2013-01-13 | Disposition: A | Discharge status: Home / Self Care | Payer: BC Managed Care – PPO | Source: Ambulatory Visit | Attending: Pulmonary Disease | Admitting: Pulmonary Disease

## 2013-01-13 ENCOUNTER — Ambulatory Visit (INDEPENDENT_AMBULATORY_CARE_PROVIDER_SITE_OTHER): Payer: BC Managed Care – PPO | Admitting: Pulmonary Disease

## 2013-01-13 ENCOUNTER — Encounter: Payer: Self-pay | Admitting: Pulmonary Disease

## 2013-01-13 VITALS — BP 126/80 | HR 101 | Temp 99.1°F | Ht 59.0 in | Wt 175.0 lb

## 2013-01-13 DIAGNOSIS — D869 Sarcoidosis, unspecified: Secondary | ICD-10-CM

## 2013-01-13 DIAGNOSIS — J99 Respiratory disorders in diseases classified elsewhere: Secondary | ICD-10-CM

## 2013-01-13 DIAGNOSIS — R918 Other nonspecific abnormal finding of lung field: Secondary | ICD-10-CM

## 2013-01-13 DIAGNOSIS — D860 Sarcoidosis of lung: Secondary | ICD-10-CM

## 2013-01-13 LAB — PULMONARY FUNCTION TEST

## 2013-01-13 NOTE — Patient Instructions (Addendum)
Lung function appears good Chest xray appears unchanged Decrease prednisone  to 1/2 tab daily x 2 weeks then 1/2 tab m/w/f until done Eye exam for sarcoid Trial of nasonex 1 spray each nare at bedtime Claritin or zyrtec during allergy season Weight loss - if snoring persists , you will need sleep study Recommendations will be sent to drShelton- further FU with her for CXR in 3 months

## 2013-01-13 NOTE — Progress Notes (Signed)
PFT done today. 

## 2013-01-13 NOTE — Progress Notes (Signed)
  Subjective:    Patient ID: Lauren Russo, female    DOB: Jan 30, 1971, 42 y.o.   MRN: [id number]  HPI 42 yo AAF  for pulmonary nodules. During hospitalization. 10/30/2012   Patient was admitted December 14.-11/05/2012 for multiple pulmonary nodules and shortness of breath. On initial presentation had rt neck pain &  low-grade fever w/ SIRS. She was treated empirically for possible pneumonia. CT chest showed multiple bilateral lung masses and nodules with mild hilar and mediastinal lymphadenopathy  Patient underwent a CT guided lung biopsy that showed non-necrotizing granulomas with no evidence of malignancy , ACE was low , CT angio neck neg for thrombophlebitis of neck veins   AFP and HCG low.  ANA, ANCA neg  . 2-D echo showed normal left ventricular function She was discharged on prednisone 20 mg daily  Seen on 11/09/12 >> pred decreased 10 mg  Continues to have cough with occasional thick mucus esp in am  She also continues to have pleuritic pain, especially with coughing  She denies any fever or orthopnea, PND, or leg swelling  She has no frank hemoptysis   She is very upset & does not want to come back to our office, she feels she did not get proper advice about diet for sarcoidosis- she prefers to FU with her PCP Has gained 12 lbs, c/o loud snoring & nasal congestion She has multiple deaths in her family  CXr shows unchanged nodules PFTs - FVC 96%, DLCO 68% corrects to 100% for Va  Past Medical History  Diagnosis Date  . Tubal pregnancy   . Hypertension   . Ovarian cyst        Review of Systems neg for any significant sore throat, dysphagia, itching, sneezing, nasal congestion or excess/ purulent secretions, fever, chills, sweats, unintended wt loss, pleuritic or exertional cp, hempoptysis, orthopnea pnd or change in chronic leg swelling. Also denies presyncope, palpitations, heartburn, abdominal pain, nausea, vomiting, diarrhea or change in bowel or urinary habits,  dysuria,hematuria, rash, arthralgias, visual complaints, headache, numbness weakness or ataxia.     Objective:   Physical Exam  Gen. Pleasant, obese, in no distress, normal affect ENT - no lesions, no post nasal drip, class 2-3 airway, large tonsils Neck: No JVD, no thyromegaly, no carotid bruits Lungs: no use of accessory muscles, no dullness to percussion, decreased without rales or rhonchi  Cardiovascular: Rhythm regular, heart sounds  normal, no murmurs or gallops, no peripheral edema Abdomen: soft and non-tender, no hepatosplenomegaly, BS normal. Musculoskeletal: No deformities, no cyanosis or clubbing Neuro:  alert, non focal, no tremors       Assessment & Plan:

## 2013-01-13 NOTE — Assessment & Plan Note (Signed)
Lung function appears good Chest xray appears unchanged Will taper off steroids due to side effects Decrease prednisone  to 1/2 tab daily x 2 weeks then 1/2 tab m/w/f until done Eye exam for sarcoid Trial of nasonex 1 spray each nare at bedtime Claritin or zyrtec during allergy season Weight loss - if snoring persists , you will need sleep study Recommendations will be sent to drShelton- further FU with PCP for CXR in 3 months If nodules increase in size or new nodules appear, she can be referred

## 2013-04-13 ENCOUNTER — Other Ambulatory Visit (HOSPITAL_COMMUNITY): Payer: Self-pay | Admitting: Otolaryngology

## 2013-04-13 DIAGNOSIS — J329 Chronic sinusitis, unspecified: Secondary | ICD-10-CM

## 2013-04-15 ENCOUNTER — Ambulatory Visit (HOSPITAL_COMMUNITY): Payer: BC Managed Care – PPO

## 2013-05-06 ENCOUNTER — Ambulatory Visit (HOSPITAL_BASED_OUTPATIENT_CLINIC_OR_DEPARTMENT_OTHER): Payer: BC Managed Care – PPO

## 2013-05-06 ENCOUNTER — Ambulatory Visit (HOSPITAL_COMMUNITY)
Admission: RE | Admit: 2013-05-06 | Discharge: 2013-05-06 | Disposition: A | Discharge status: Home / Self Care | Payer: BC Managed Care – PPO | Source: Ambulatory Visit | Attending: Otolaryngology | Admitting: Otolaryngology

## 2013-05-06 DIAGNOSIS — J329 Chronic sinusitis, unspecified: Secondary | ICD-10-CM | POA: Insufficient documentation

## 2013-08-04 ENCOUNTER — Other Ambulatory Visit: Payer: Self-pay

## 2013-08-04 ENCOUNTER — Other Ambulatory Visit: Payer: Self-pay | Admitting: Family Medicine

## 2013-08-04 DIAGNOSIS — Z1231 Encounter for screening mammogram for malignant neoplasm of breast: Secondary | ICD-10-CM

## 2013-08-29 ENCOUNTER — Ambulatory Visit
Admission: RE | Admit: 2013-08-29 | Discharge: 2013-08-29 | Disposition: A | Discharge status: Home / Self Care | Payer: BC Managed Care – PPO | Source: Ambulatory Visit

## 2013-08-29 DIAGNOSIS — Z1231 Encounter for screening mammogram for malignant neoplasm of breast: Secondary | ICD-10-CM

## 2013-09-12 ENCOUNTER — Ambulatory Visit
Admission: RE | Admit: 2013-09-12 | Discharge: 2013-09-12 | Disposition: A | Discharge status: Home / Self Care | Payer: BC Managed Care – PPO | Source: Ambulatory Visit | Attending: Internal Medicine | Admitting: Internal Medicine

## 2013-09-12 ENCOUNTER — Other Ambulatory Visit: Payer: Self-pay | Admitting: Internal Medicine

## 2013-09-12 DIAGNOSIS — D869 Sarcoidosis, unspecified: Secondary | ICD-10-CM

## 2013-09-12 DIAGNOSIS — E559 Vitamin D deficiency, unspecified: Secondary | ICD-10-CM | POA: Insufficient documentation

## 2013-09-22 ENCOUNTER — Other Ambulatory Visit: Payer: Self-pay

## 2014-08-08 ENCOUNTER — Other Ambulatory Visit: Payer: Self-pay

## 2014-08-08 DIAGNOSIS — Z1231 Encounter for screening mammogram for malignant neoplasm of breast: Secondary | ICD-10-CM

## 2014-09-15 ENCOUNTER — Ambulatory Visit: Payer: BC Managed Care – PPO

## 2014-09-18 ENCOUNTER — Encounter: Payer: Self-pay | Admitting: Pulmonary Disease

## 2014-10-16 ENCOUNTER — Other Ambulatory Visit: Payer: Self-pay | Admitting: Gastroenterology

## 2014-10-16 DIAGNOSIS — R1033 Periumbilical pain: Secondary | ICD-10-CM

## 2014-10-19 ENCOUNTER — Other Ambulatory Visit: Payer: BC Managed Care – PPO

## 2014-10-19 ENCOUNTER — Ambulatory Visit
Admission: RE | Admit: 2014-10-19 | Discharge: 2014-10-19 | Disposition: A | Discharge status: Home / Self Care | Payer: 59 | Source: Ambulatory Visit | Attending: Gastroenterology | Admitting: Gastroenterology

## 2014-10-19 DIAGNOSIS — R1033 Periumbilical pain: Secondary | ICD-10-CM

## 2014-10-19 MED ORDER — IOHEXOL 300 MG/ML  SOLN
100.0000 mL | Freq: Once | INTRAMUSCULAR | Status: AC | PRN
Start: 2014-10-19 — End: 2014-10-19
  Administered 2014-10-19: 100 mL via INTRAVENOUS

## 2014-11-02 ENCOUNTER — Ambulatory Visit
Admission: RE | Admit: 2014-11-02 | Discharge: 2014-11-02 | Disposition: A | Discharge status: Home / Self Care | Payer: 59 | Source: Ambulatory Visit

## 2014-11-02 DIAGNOSIS — Z1231 Encounter for screening mammogram for malignant neoplasm of breast: Secondary | ICD-10-CM

## 2014-11-06 ENCOUNTER — Other Ambulatory Visit: Payer: Self-pay | Admitting: Obstetrics and Gynecology

## 2014-11-06 DIAGNOSIS — R928 Other abnormal and inconclusive findings on diagnostic imaging of breast: Secondary | ICD-10-CM

## 2014-11-15 ENCOUNTER — Telehealth: Payer: Self-pay | Admitting: Pulmonary Disease

## 2014-11-15 NOTE — Telephone Encounter (Signed)
Please advise Dr Shelle Ironlance if you are okay with seeing this patient. The patients records are in your look at cubby to be reviewed if you wish to take patients case on.

## 2014-11-16 ENCOUNTER — Other Ambulatory Visit: Payer: Self-pay | Admitting: Gastroenterology

## 2014-11-16 DIAGNOSIS — R1013 Epigastric pain: Secondary | ICD-10-CM

## 2014-11-16 DIAGNOSIS — R1310 Dysphagia, unspecified: Secondary | ICD-10-CM

## 2014-11-17 NOTE — Telephone Encounter (Signed)
Have reviewed record.  Would prefer not to take on this patient.  She may be better served at one of the academic centers.

## 2014-11-20 NOTE — Telephone Encounter (Signed)
Pt called back. Pt upset that [facility] is unable to take pt on. Informed pt that it is not a guarantee that a provider will take on a new pt on even though they are accepting pt's. Pt progressively upset and demanding [facility] give specifics why pt can't be seen by [facility]. Pt questioning if her case has worsened and that is the reason [facility] thinks she can be better severed at an academic center. Pt is willing to be seen by a different provider.   [facility] please advise.

## 2014-11-20 NOTE — Telephone Encounter (Signed)
Called and spoke to pt. Informed pt of the recs per [facility]. Pt became upset and stated her "case isn't that severe to be seen at an academic center and it makes no sense for Dr. Shelle Iron to not see me." Pt disconnected line right after stating the previous. Will sign off. Nothing further needed.

## 2014-11-21 ENCOUNTER — Ambulatory Visit
Admission: RE | Admit: 2014-11-21 | Discharge: 2014-11-21 | Disposition: A | Discharge status: Home / Self Care | Payer: 59 | Source: Ambulatory Visit | Attending: Obstetrics and Gynecology | Admitting: Obstetrics and Gynecology

## 2014-11-21 DIAGNOSIS — R928 Other abnormal and inconclusive findings on diagnostic imaging of breast: Secondary | ICD-10-CM

## 2014-11-22 ENCOUNTER — Ambulatory Visit
Admission: RE | Admit: 2014-11-22 | Discharge: 2014-11-22 | Disposition: A | Discharge status: Home / Self Care | Payer: 59 | Source: Ambulatory Visit | Attending: Gastroenterology | Admitting: Gastroenterology

## 2014-11-22 DIAGNOSIS — R1310 Dysphagia, unspecified: Secondary | ICD-10-CM

## 2014-11-22 DIAGNOSIS — R1013 Epigastric pain: Secondary | ICD-10-CM

## 2014-11-29 NOTE — Telephone Encounter (Signed)
I spoke with pt who reports she did not like the way RA provided her care re: providing her with appropriate information to better take care of herself/disease.  Pt states because of this, her pulmonary issues have been managed by PCP, Dr. Renae GlossShelton.  Dr. Renae GlossShelton has now left to go to another office where pt will need to pay more money and doesn't want to do this.  Therefore, she would like to go back to a pulmonologist to oversee care.    [facility].  Pt upset [facility] would not accept her as a pt and recommended she go to an academic center.  States she wanted to file a 'formal complaint and be done with it" because she wasn't going to beg someone to see her.  Explained office protocol on switching MDs -- pt was unaware of this and was more understanding now.  [facility] but willing to see another provider in office stating she would still "file a complaint if I feel some kind of way."  We have scheduled her to see MR on Dec 14, 2014.  Pt asked to arrive at 4pm to complete paperwork and aware appt time is at 4:15.  Pt confirmed appt and is aware providers' appointments can run late, so she may not be taken back to room right at 4:15 and aware I could not provide an exact time she may be brought back.  Pt willing to wait to be seen and is understanding of this but wasn't happy that she may be in office for several hours.  I assured pt we do our best to provide timely, thorough care to each of our pts and would do the same for her.  She verbalized understanding, is to call office back if anything is needed prior to appt, and voiced no further questions or concerns at this time.

## 2014-12-09 ENCOUNTER — Encounter (HOSPITAL_COMMUNITY): Payer: Self-pay | Admitting: Emergency Medicine

## 2014-12-09 ENCOUNTER — Emergency Department (HOSPITAL_COMMUNITY): Payer: 59

## 2014-12-09 ENCOUNTER — Observation Stay (HOSPITAL_COMMUNITY)
Admission: EM | Admit: 2014-12-09 | Discharge: 2014-12-10 | Disposition: A | Discharge status: Home / Self Care | Payer: 59 | Attending: Internal Medicine | Admitting: Internal Medicine

## 2014-12-09 DIAGNOSIS — Z79899 Other long term (current) drug therapy: Secondary | ICD-10-CM | POA: Insufficient documentation

## 2014-12-09 DIAGNOSIS — K222 Esophageal obstruction: Secondary | ICD-10-CM

## 2014-12-09 DIAGNOSIS — R079 Chest pain, unspecified: Principal | ICD-10-CM | POA: Diagnosis present

## 2014-12-09 DIAGNOSIS — R0789 Other chest pain: Secondary | ICD-10-CM

## 2014-12-09 DIAGNOSIS — R9431 Abnormal electrocardiogram [ECG] [EKG]: Secondary | ICD-10-CM | POA: Diagnosis present

## 2014-12-09 DIAGNOSIS — R110 Nausea: Secondary | ICD-10-CM | POA: Diagnosis not present

## 2014-12-09 DIAGNOSIS — I10 Essential (primary) hypertension: Secondary | ICD-10-CM | POA: Diagnosis present

## 2014-12-09 DIAGNOSIS — E876 Hypokalemia: Secondary | ICD-10-CM | POA: Diagnosis present

## 2014-12-09 DIAGNOSIS — D860 Sarcoidosis of lung: Secondary | ICD-10-CM | POA: Diagnosis present

## 2014-12-09 DIAGNOSIS — R42 Dizziness and giddiness: Secondary | ICD-10-CM | POA: Diagnosis not present

## 2014-12-09 DIAGNOSIS — Z8742 Personal history of other diseases of the female genital tract: Secondary | ICD-10-CM | POA: Insufficient documentation

## 2014-12-09 DIAGNOSIS — D649 Anemia, unspecified: Secondary | ICD-10-CM | POA: Diagnosis present

## 2014-12-09 DIAGNOSIS — Z7952 Long term (current) use of systemic steroids: Secondary | ICD-10-CM | POA: Diagnosis not present

## 2014-12-09 DIAGNOSIS — R61 Generalized hyperhidrosis: Secondary | ICD-10-CM | POA: Diagnosis not present

## 2014-12-09 DIAGNOSIS — I471 Supraventricular tachycardia, unspecified: Secondary | ICD-10-CM | POA: Diagnosis present

## 2014-12-09 DIAGNOSIS — K219 Gastro-esophageal reflux disease without esophagitis: Secondary | ICD-10-CM

## 2014-12-09 DIAGNOSIS — R109 Unspecified abdominal pain: Secondary | ICD-10-CM | POA: Insufficient documentation

## 2014-12-09 LAB — CBC WITH DIFFERENTIAL/PLATELET
BASOS ABS: 0 10*3/uL (ref 0.0–0.1)
Basophils Relative: 0 % (ref 0–1)
EOS ABS: 0.1 10*3/uL (ref 0.0–0.7)
Eosinophils Relative: 1 % (ref 0–5)
HCT: 38 % (ref 36.0–46.0)
Hemoglobin: 13.1 g/dL (ref 12.0–15.0)
Lymphocytes Relative: 59 % — ABNORMAL HIGH (ref 12–46)
Lymphs Abs: 4.1 10*3/uL — ABNORMAL HIGH (ref 0.7–4.0)
MCH: 29.4 pg (ref 26.0–34.0)
MCHC: 34.5 g/dL (ref 30.0–36.0)
MCV: 85.4 fL (ref 78.0–100.0)
Monocytes Absolute: 0.5 10*3/uL (ref 0.1–1.0)
Monocytes Relative: 7 % (ref 3–12)
NEUTROS PCT: 33 % — AB (ref 43–77)
Neutro Abs: 2.4 10*3/uL (ref 1.7–7.7)
Platelets: 337 10*3/uL (ref 150–400)
RBC: 4.45 MIL/uL (ref 3.87–5.11)
RDW: 13 % (ref 11.5–15.5)
WBC: 7.1 10*3/uL (ref 4.0–10.5)

## 2014-12-09 LAB — BASIC METABOLIC PANEL
ANION GAP: 12 (ref 5–15)
BUN: 13 mg/dL (ref 6–23)
CHLORIDE: 98 mmol/L (ref 96–112)
CO2: 25 mmol/L (ref 19–32)
CREATININE: 0.9 mg/dL (ref 0.50–1.10)
Calcium: 9.3 mg/dL (ref 8.4–10.5)
GFR calc Af Amer: >90 mL/min (ref >90)
GFR, EST NON AFRICAN AMERICAN: 78 mL/min — AB (ref >90)
GLUCOSE: 139 mg/dL — AB (ref 70–99)
Potassium: 2.4 mmol/L — CL (ref 3.5–5.1)
Sodium: 135 mmol/L (ref 135–145)

## 2014-12-09 LAB — TROPONIN I: Troponin I: <0.03 ng/mL (ref <0.031)

## 2014-12-09 LAB — POTASSIUM: Potassium: 2.6 mmol/L — CL (ref 3.5–5.1)

## 2014-12-09 LAB — MAGNESIUM
MAGNESIUM: 1.7 mg/dL (ref 1.5–2.5)
MAGNESIUM: 2.6 mg/dL — AB (ref 1.5–2.5)

## 2014-12-09 MED ORDER — ALPRAZOLAM 0.5 MG PO TABS: 1.0000 mg | ORAL_TABLET | Freq: Three times a day (TID) | ORAL | Status: DC | PRN

## 2014-12-09 MED ORDER — POTASSIUM CHLORIDE IN NACL 20-0.9 MEQ/L-% IV SOLN
INTRAVENOUS | Status: DC
  Administered 2014-12-09: 21:00:00 via INTRAVENOUS
  Filled 2014-12-09: qty 1000

## 2014-12-09 MED ORDER — ASPIRIN EC 325 MG PO TBEC
325.0000 mg | DELAYED_RELEASE_TABLET | Freq: Every day | ORAL | Status: DC
Start: 2014-12-09 — End: 2014-12-10
  Administered 2014-12-10: 325 mg via ORAL
  Filled 2014-12-09: qty 1

## 2014-12-09 MED ORDER — POTASSIUM CHLORIDE 10 MEQ/100ML IV SOLN
10.0000 meq | Freq: Once | INTRAVENOUS | Status: AC
  Administered 2014-12-09: 10 meq via INTRAVENOUS
  Filled 2014-12-09: qty 100

## 2014-12-09 MED ORDER — ONDANSETRON HCL 4 MG/2ML IJ SOLN: 4.0000 mg | Freq: Four times a day (QID) | INTRAMUSCULAR | Status: DC | PRN

## 2014-12-09 MED ORDER — MAGNESIUM SULFATE 2 GM/50ML IV SOLN
2.0000 g | Freq: Once | INTRAVENOUS | Status: AC
  Administered 2014-12-09: 2 g via INTRAVENOUS
  Filled 2014-12-09: qty 50

## 2014-12-09 MED ORDER — POTASSIUM CHLORIDE 20 MEQ/15ML (10%) PO SOLN
40.0000 meq | Freq: Once | ORAL | Status: AC
  Administered 2014-12-09: 40 meq via ORAL
  Filled 2014-12-09: qty 30

## 2014-12-09 MED ORDER — ACETAMINOPHEN 325 MG PO TABS: 650.0000 mg | ORAL_TABLET | ORAL | Status: DC | PRN

## 2014-12-09 MED ORDER — POTASSIUM CHLORIDE CRYS ER 20 MEQ PO TBCR
40.0000 meq | EXTENDED_RELEASE_TABLET | Freq: Once | ORAL | Status: AC
  Administered 2014-12-09: 40 meq via ORAL
  Filled 2014-12-09: qty 2

## 2014-12-09 MED ORDER — ONDANSETRON HCL 4 MG/2ML IJ SOLN
4.0000 mg | Freq: Once | INTRAMUSCULAR | Status: AC
Start: 2014-12-09 — End: 2014-12-09
  Administered 2014-12-09: 4 mg via INTRAVENOUS
  Filled 2014-12-09: qty 2

## 2014-12-09 MED ORDER — PROMETHAZINE HCL 25 MG/ML IJ SOLN
12.5000 mg | Freq: Once | INTRAMUSCULAR | Status: AC
  Administered 2014-12-09: 12.5 mg via INTRAVENOUS
  Filled 2014-12-09: qty 1

## 2014-12-09 MED ORDER — POTASSIUM CHLORIDE CRYS ER 20 MEQ PO TBCR
30.0000 meq | EXTENDED_RELEASE_TABLET | ORAL | Status: AC
  Administered 2014-12-09 – 2014-12-10 (×3): 30 meq via ORAL
  Filled 2014-12-09 (×8): qty 1

## 2014-12-09 MED ORDER — ONDANSETRON HCL 4 MG/2ML IJ SOLN
4.0000 mg | Freq: Once | INTRAMUSCULAR | Status: AC
  Administered 2014-12-09: 4 mg via INTRAVENOUS
  Filled 2014-12-09: qty 2

## 2014-12-09 NOTE — Consult Note (Signed)
Referring Physician: Philip AspenHernandez Acosta M.D.  Primary Physician: Gwynneth AlimentSANDERS,[name], MD Primary Cardiologist: none Reason for Consultation:  chest pain and SVT  HPI: 44 year old African-American female with past medical history of hypertension, sarcoidosis of the lungs. Who presented with multiple complaints including:  - intermittent dizziness, mostly while walking around - dull chest ache in the center of her chest, 10 out of 10, with radiation to the left side of her chest, no exacerbating or relieving factors, chest pain usually lasts a few seconds to 1 minute. Nonexertional. Not excerbated by food intake - Nausea  - Palpitations, heart racings usually lasting up to 1 minute  Patient reported all symptoms started on Wednesday, January 2oth after she had a for esophageal dilation.She was trying to keep herself hydrated but reported minimal solid food intake.  Pt finally called the ambulance and was brought to the ER for further evaluation. She received morphine for CP. In the ED she received IV antiemetic, PO KCL 80 meq, IV Mg 2 gr. In route EMS noted SVT on the telemetry, no strips were available for my review.    Patient denied lower extremity edema, PND or orthopnea, chills, fever, diuria.   Review of Systems:  12 systems were reviewed nad were negative except mentioned in the HPI  Past Medical History  Diagnosis Date  . Tubal pregnancy   . Hypertension   . Ovarian cyst    Past Surgical History  Procedure Laterality Date  . Tubal ligation    . Laparoscopic salpingoopherectomy      unsure of which side.    Home medications: Hydrochlorothiazide  Current Medications:   Infusions:  . potassium chloride Stopped (12/09/14 1818)     (Not in a hospital admission)   No Known Allergies  History   Social History  . Marital Status: Widowed    Spouse Name: N/A    Number of Children: N/A  . Years of Education: N/A   Occupational History  . Not on file.   Social  History Main Topics  . Smoking status: Passive Smoke Exposure - Never Smoker    Types: Cigarettes  . Smokeless tobacco: Not on file     Comment: reports smokes socially x1 month  . Alcohol Use: 1.2 oz/week    2 Glasses of wine per week  . Drug Use: No  . Sexual Activity: Yes    Birth Control/ Protection: Surgical   Other Topics Concern  . Not on file   Social History Narrative    History reviewed. No pertinent family history. No family status information on file.    PHYSICAL EXAM: Filed Vitals:   12/09/14 1801  BP: 120/79  Pulse: 87  Temp:   Resp: 22    No intake or output data in the 24 hours ending 12/09/14 1827  General:  Well appearing. No respiratory difficulty HEENT: normal Neck: supple. no JVD. Carotids 2+ bilat; no bruits. No lymphadenopathy or thryomegaly appreciated. Cor: PMI nondisplaced. Regular rate & rhythm. No rubs, gallops or murmurs. Lungs: clear to auscultation bilaterally Abdomen: soft, epigastric tenderness, palpation elicited that episode of vomiting, nondistended.  Extremities: no cyanosis, clubbing, rash, edema Neuro: alert & oriented x 3, cranial nerves grossly intact. moves all 4 extremities w/o difficulty. Affect pleasant.  Results for orders placed or performed during the hospital encounter of 12/09/14 (from the past 24 hour(s))  CBC with Differential     Status: Abnormal   Collection Time: 12/09/14  4:53 PM  Result Value Ref Range  WBC 7.1 4.0 - 10.5 K/uL   RBC 4.45 3.87 - 5.11 MIL/uL   Hemoglobin 13.1 12.0 - 15.0 g/dL   HCT 02.7 25.3 - 66.4 %   MCV 85.4 78.0 - 100.0 fL   MCH 29.4 26.0 - 34.0 pg   MCHC 34.5 30.0 - 36.0 g/dL   RDW 40.3 47.4 - 25.9 %   Platelets 337 150 - 400 K/uL   Neutrophils Relative % 33 (L) 43 - 77 %   Neutro Abs 2.4 1.7 - 7.7 K/uL   Lymphocytes Relative 59 (H) 12 - 46 %   Lymphs Abs 4.1 (H) 0.7 - 4.0 K/uL   Monocytes Relative 7 3 - 12 %   Monocytes Absolute 0.5 0.1 - 1.0 K/uL   Eosinophils Relative 1 0 - 5 %    Eosinophils Absolute 0.1 0.0 - 0.7 K/uL   Basophils Relative 0 0 - 1 %   Basophils Absolute 0.0 0.0 - 0.1 K/uL  Basic metabolic panel     Status: Abnormal   Collection Time: 12/09/14  4:53 PM  Result Value Ref Range   Sodium 135 135 - 145 mmol/L   Potassium 2.4 (LL) 3.5 - 5.1 mmol/L   Chloride 98 96 - 112 mmol/L   CO2 25 19 - 32 mmol/L   Glucose, Bld 139 (H) 70 - 99 mg/dL   BUN 13 6 - 23 mg/dL   Creatinine, Ser 5.63 0.50 - 1.10 mg/dL   Calcium 9.3 8.4 - 87.5 mg/dL   GFR calc non Af Amer 78 (L) >90 mL/min   GFR calc Af Amer >90 >90 mL/min   Anion gap 12 5 - 15  Troponin I     Status: None   Collection Time: 12/09/14  4:53 PM  Result Value Ref Range   Troponin I <0.03 <0.031 ng/mL  Magnesium     Status: None   Collection Time: 12/09/14  4:53 PM  Result Value Ref Range   Magnesium 1.7 1.5 - 2.5 mg/dL   Radiology:  Dg Chest Portable 1 View  12/09/2014   CLINICAL DATA:  Chest pain  EXAM: PORTABLE CHEST - 1 VIEW  COMPARISON:  09/12/2014  FINDINGS: The heart size and mediastinal contours are within normal limits. Both lungs are clear. The visualized skeletal structures are unremarkable.  IMPRESSION: No active disease.   Electronically Signed   By: Signa Kell M.D.   On: 12/09/2014 17:07    ECG: NSR 69 bpm, normal asix, diffuse minimal ST segment depression - likely result of hypokaliemia ECHO: limited bedside ECHO with hyperdynamic LV, pt was extremely nauseased  ASSESSMENT: Chest pain - atypical Reported SVT (No strips available) Hypomagnesemia, hypokalemia  orthostatic dizziness  History of sarcoidosis  History of recent esophageal dilation   Patient presented with multiple complaints including nausea, orthostatic dizziness,  dull chest pain, palpitations. She was one to have hypomagnesemia and severe hypokalemia on her blood work, unfortunately no EKG strips were available to see what her SVT was however patient and an episode of SVT while vomiting, to me looked like sinus  tachycardia on the monitor however was a lot of baseline artifact. Patient's chest pain and arrhythmia is likely secondary to electrolyte abnormalities, nausea and recent GI intervention   Recommendations: -  IV rehydration with at least 2 L of normal saline  - Aggressive potassium and magnesium replacement, consider central line placement and IV supplementation as patient just vomited  - Judicious use of antiemetics, patient has normal QTc interval however Zofran may  prolong it  - Cycle troponins  - Echocardiogram given history of sarcoidosis to rule out structural heart disease  - Continue telemetry, attempt to obtain 12-lead EKG if patient has another episode of SVT  - Consider using different antihypertensive medication on discharge other than HCTZ    Nolon Nations, MD 12/09/2014 6:27 PM

## 2014-12-09 NOTE — ED Notes (Signed)
Cardiologist at bedside.  

## 2014-12-09 NOTE — ED Notes (Addendum)
Admitting Dr. Onalee Huaavid made aware that pt refused potassium infusion.

## 2014-12-09 NOTE — ED Notes (Signed)
Report attempt

## 2014-12-09 NOTE — ED Notes (Signed)
Pt in via EMS c/o CP starting 3 days ago. Pt was hesitant to call for EMS due to husband passing away of MI last time she was here. En route pt had 1 SL nitro and 4 mg morphine, pt was going in and out of SVT en route as well, normal rate at 90's and then SVT in 150-160. Pt with no pain at this time and NSR in 70's.

## 2014-12-09 NOTE — H&P (Signed)
PCP:   Gwynneth AlimentSANDERS,[name], MD   Chief Complaint:  Chest pain  HPI: 44 yo female h/o htn, sarcoidosis of lung comes in with 2-3 days of episodes of palpitations with associated sscp that radiates to the left arm and diaphoresis that lasts sometimes seconds, sometimes up to ten minutes then it resolves spontaneously.  She has had a recent w/u for her chronic gerd and had egd showing stricture which was dilated, her gerd has gotten better and these symptoms are different that what she has previously experienced.  No vomiting.  No fevers.  No cough.  No diarrhea.  No pleuritic nature to her chest pain.  She has been in nsr since arrival to ED, has been cp free but has diffuse st seg depressions on 12 lead ekg.  Has no cardiac history.  No le edema or swelling.    Review of Systems:  Positive and negative as per HPI otherwise all other systems are negative  Past Medical History: Past Medical History  Diagnosis Date  . Tubal pregnancy   . Hypertension   . Ovarian cyst    Past Surgical History  Procedure Laterality Date  . Tubal ligation    . Laparoscopic salpingoopherectomy      unsure of which side.     Medications: Prior to Admission medications   Medication Sig Start Date End Date Taking? Authorizing Provider  ALPRAZolam Prudy Feeler(XANAX) 1 MG tablet Take 1 tablet (1 mg total) by mouth 3 (three) times daily as needed for anxiety. 11/05/12  Yes Dorothea OgleIskra M Myers, MD  hydrochlorothiazide (MICROZIDE) 12.5 MG capsule Take 1 capsule (12.5 mg total) by mouth daily. 11/05/12  Yes Dorothea OgleIskra M Myers, MD  vitamin C (ASCORBIC ACID) 500 MG tablet Take 500 mg by mouth daily.   Yes Historical Provider, MD  chlorpheniramine-HYDROcodone (TUSSIONEX) 10-8 MG/5ML LQCR Take 5 mLs by mouth every 12 (twelve) hours as needed. 11/05/12   Dorothea OgleIskra M Myers, MD  predniSONE (DELTASONE) 5 MG tablet Take 5 mg by mouth daily.    Historical Provider, MD    Allergies:  No Known Allergies  Social History:  reports that she has been  passively smoking Cigarettes.  She does not have any smokeless tobacco history on file. She reports that she drinks about 1.2 oz of alcohol per week. She reports that she does not use illicit drugs.  Family History: History reviewed. No pertinent family history.  Physical Exam: Filed Vitals:   12/09/14 1700 12/09/14 1703 12/09/14 1730 12/09/14 1801  BP: 111/72  118/74 120/79  Pulse: 91  65 87  Temp:  97.6 F (36.4 C)    TempSrc:  Oral    Resp: 14  16 22   SpO2: 99%  97% 99%   General appearance: alert, cooperative and no distress Head: Normocephalic, without obvious abnormality, atraumatic Eyes: negative Nose: Nares normal. Septum midline. Mucosa normal. No drainage or sinus tenderness. Neck: no JVD and supple, symmetrical, trachea midline Lungs: clear to auscultation bilaterally Heart: regular rate and rhythm, S1, S2 normal, no murmur, click, rub or gallop Abdomen: soft, non-tender; bowel sounds normal; no masses,  no organomegaly Extremities: extremities normal, atraumatic, no cyanosis or edema Pulses: 2+ and symmetric Skin: Skin color, texture, turgor normal. No rashes or lesions Neurologic: Grossly normal    Labs on Admission:   Recent Labs  12/09/14 1653  NA 135  K 2.4*  CL 98  CO2 25  GLUCOSE 139*  BUN 13  CREATININE 0.90  CALCIUM 9.3  MG 1.7  Recent Labs  12/09/14 1653  WBC 7.1  NEUTROABS 2.4  HGB 13.1  HCT 38.0  MCV 85.4  PLT 337    Recent Labs  12/09/14 1653  TROPONINI <0.03   Radiological Exams on Admission: Dg Chest Portable 1 View  12/09/2014   CLINICAL DATA:  Chest pain  EXAM: PORTABLE CHEST - 1 VIEW  COMPARISON:  09/12/2014  FINDINGS: The heart size and mediastinal contours are within normal limits. Both lungs are clear. The visualized skeletal structures are unremarkable.  IMPRESSION: No active disease.   Electronically Signed   By: Signa Kell M.D.   On: 12/09/2014 17:07   Dg Ugi W/high Density W/kub  11/22/2014   CLINICAL DATA:   Epigastric abdominal pain, dysphagia  EXAM: UPPER GI SERIES WITH KUB  TECHNIQUE: After obtaining a scout radiograph a routine upper GI series was performed using thin and high density barium.  FLUOROSCOPY TIME:  3 min 30 seconds  COMPARISON:  CT abdomen pelvis of 10/19/2014  FINDINGS: A preliminary film of the abdomen shows a nonspecific bowel gas pattern. A moderate amount of feces is noted throughout the colon. No opaque calculi are noted.  A double-contrast study was performed. The mucosa of the esophagus is unremarkable. The swallowing mechanism is unremarkable. However there is slight narrowing of the lower cervical esophagus, and an enlarged thyroid gland could create this appearance. Correlate clinically. Also, there is a persistent narrowing of the distal esophagus just above the gastroesophageal junction. This lesion has fairly smooth margins and probably reflects reflux esophagitis and stricture, but neoplasm cannot be excluded. Endoscopy is recommended. No definite gastroesophageal reflux could be demonstrated. A barium pill was given at the end of the study which did not pass beyond the stricture before dissolving.  The stomach is normal in contour and peristalsis. The duodenal bulb fills and the duodenal loop is in normal position.  IMPRESSION: 1. Smoothly marginated stricture over several cm of the distal esophagus most likely reflects a stricture secondary to reflux esophagitis. Neoplasm cannot be excluded and endoscopy is recommended. 2. Smooth narrowing of the lower cervical esophagus may be due to enlargement of the thyroid gland. Correlate clinically. 3. The stomach and duodenum are unremarkable.   Electronically Signed   By: Dwyane Dee M.D.   On: 11/22/2014 10:59   Assessment/Plan  44 yo female with chest pain suspect related to underlying svt with electrolyte abnormalities on hctz  Principal Problem:   Possible SVT (supraventricular tachycardia)-  With other ekg changes - likely  secondary to severe hypokalemia.  Replete k and give mag sulfate also.  Cardiology has been consulted for further assistance.  Active Problems:   Chest pain-  May be due to svt.  Echo in am.  Serial trop and ekg.  Await cards recs.  Currently cp free.   Normocytic anemia-  stable   Sarcoidosis of lung-  stable   Hypokalemia severe -  Cannot tolerate iv kcl even at slowest rate due to burning.  Has gotten kcl tabs po, will give kcl 40 liquid form along with some in ivf.  Repeat k and mag levels in 3-4 hours.  Monitor closely on telemetry.  Hold hctz.   GERD with stricture-  stable  obs on tele.  Full code.  Maguadalupe Lata A 12/09/2014, 6:16 PM

## 2014-12-09 NOTE — ED Notes (Signed)
Pt took her own 325mg  ASA prior to arrival to ED.

## 2014-12-09 NOTE — Progress Notes (Signed)
Critical value received. Pt's potasium 2.6. Np on call made aware. New orders received.

## 2014-12-09 NOTE — ED Provider Notes (Signed)
CSN: [id number]     Arrival date & time 12/09/14  1642 History   First MD Initiated Contact with Patient 12/09/14 1644     Chief Complaint  Patient presents with  . Chest Pain     (Consider location/radiation/quality/duration/timing/severity/associated sxs/prior Treatment) Patient is a 44 y.o. female presenting with chest pain. The history is provided by the patient. No language interpreter was used.  Chest Pain Pain location:  Substernal area Pain quality: dull   Pain radiates to:  Upper back and L shoulder Pain radiates to the back: yes   Pain severity:  Moderate Onset quality:  Gradual Duration:  3 days Timing:  Intermittent Progression:  Waxing and waning Chronicity:  Recurrent Context: not breathing, not lifting, no movement and not raising an arm   Relieved by:  Nothing Worsened by:  Nothing tried Ineffective treatments:  None tried Associated symptoms: abdominal pain, diaphoresis, dizziness and nausea   Associated symptoms: no cough, no fever, no numbness, no shortness of breath and no syncope   Risk factors: hypertension   Risk factors: no aortic disease, no birth control, no coronary artery disease, no diabetes mellitus, no high cholesterol, not female and not obese     Past Medical History  Diagnosis Date  . Tubal pregnancy   . Hypertension   . Ovarian cyst    Past Surgical History  Procedure Laterality Date  . Tubal ligation    . Laparoscopic salpingoopherectomy      unsure of which side.    History reviewed. No pertinent family history. History  Substance Use Topics  . Smoking status: Passive Smoke Exposure - Never Smoker    Types: Cigarettes  . Smokeless tobacco: Not on file     Comment: reports smokes socially x1 month  . Alcohol Use: 1.2 oz/week    2 Glasses of wine per week   OB History    Gravida Para Term Preterm AB TAB SAB Ectopic Multiple Living   Review of Systems  Constitutional: Positive for diaphoresis. Negative  for fever.  Respiratory: Negative for cough and shortness of breath.   Cardiovascular: Positive for chest pain. Negative for syncope.  Gastrointestinal: Positive for nausea and abdominal pain.  Genitourinary: Negative for dysuria, urgency and frequency.  Neurological: Positive for dizziness. Negative for numbness.  All other systems reviewed and are negative.     Allergies  Review of patient's allergies indicates no known allergies.  Home Medications   Prior to Admission medications   Medication Sig Start Date End Date Taking? Authorizing Provider  ALPRAZolam Prudy Feeler) 1 MG tablet Take 1 tablet (1 mg total) by mouth 3 (three) times daily as needed for anxiety. 11/05/12  Yes Dorothea Ogle, MD  hydrochlorothiazide (MICROZIDE) 12.5 MG capsule Take 1 capsule (12.5 mg total) by mouth daily. 11/05/12  Yes Dorothea Ogle, MD  vitamin C (ASCORBIC ACID) 500 MG tablet Take 500 mg by mouth daily.   Yes Historical Provider, MD  chlorpheniramine-HYDROcodone (TUSSIONEX) 10-8 MG/5ML LQCR Take 5 mLs by mouth every 12 (twelve) hours as needed. 11/05/12   Dorothea Ogle, MD  predniSONE (DELTASONE) 5 MG tablet Take 5 mg by mouth daily.    Historical Provider, MD   BP 124/82 mmHg  Pulse 81  Temp(Src) 97.6 F (36.4 C) (Oral)  Resp 23  SpO2 99%  LMP 10/28/2014 Physical Exam  Constitutional: She is oriented to person, place, and time. She appears well-developed and  well-nourished. No distress.  diaphoretic  HENT:  Head: Normocephalic and atraumatic.  Eyes: Pupils are equal, round, and reactive to light.  Neck: Normal range of motion.  Cardiovascular: Normal rate, regular rhythm, normal heart sounds and intact distal pulses.   Pulmonary/Chest: Effort normal. No respiratory distress. She has no wheezes. She exhibits no tenderness.  Abdominal: Soft. Bowel sounds are normal. She exhibits no distension. There is no tenderness. There is no rebound and no guarding.  Neurological: She is alert and oriented to  person, place, and time. She has normal strength. No cranial nerve deficit or sensory deficit. She exhibits normal muscle tone. Coordination and gait normal.  Skin: Skin is warm and dry.  Nursing note and vitals reviewed.   ED Course  Procedures (including critical care time) Labs Review Labs Reviewed  CBC WITH DIFFERENTIAL/PLATELET - Abnormal; Notable for the following:    Neutrophils Relative % 33 (*)    Lymphocytes Relative 59 (*)    Lymphs Abs 4.1 (*)    All other components within normal limits  BASIC METABOLIC PANEL - Abnormal; Notable for the following:    Potassium 2.4 (*)    Glucose, Bld 139 (*)    GFR calc non Af Amer 78 (*)    All other components within normal limits  TROPONIN I  MAGNESIUM    Imaging Review Dg Chest Portable 1 View  12/09/2014   CLINICAL DATA:  Chest pain  EXAM: PORTABLE CHEST - 1 VIEW  COMPARISON:  09/12/2014  FINDINGS: The heart size and mediastinal contours are within normal limits. Both lungs are clear. The visualized skeletal structures are unremarkable.  IMPRESSION: No active disease.   Electronically Signed   By: Signa Kellaylor  Stroud M.D.   On: 12/09/2014 17:07     EKG Interpretation   Date/Time:  Saturday December 09 2014 16:46:11 EST Ventricular Rate:  69 PR Interval:  150 QRS Duration: 79 QT Interval:  400 QTC Calculation: 428 R Axis:   75 Text Interpretation:  Sinus rhythm Atrial premature complexes in couplets  Borderline ST depression, diffuse leads Baseline wander in lead(s) II III  aVF Otherwise no significant change Confirmed by HARRISON  MD, FORREST  ([id number]) on 12/09/2014 4:51:15 PM      MDM   Final diagnoses:  Chest pain  SVT (supraventricular tachycardia)  Hypokalemia    Patient is a 44 year old African-American female with pertinent past medical history of sarcoidosis and hypertension who comes to the emergency department today with precordial chest pain radiating to her left shoulder intermittently for the past 3 days.  Physical exam as above. Per EMS patient had runs of SVT en route to the emergency department. These resolved without intervention per them.  They did have rhythm strips which demonstrated SVT.  During the runs of SVT the patient had chest pain. After the runs resolved she developed nausea, diaphoresis, and shortness of breath. Initial differential is concerning for pneumonia, pneumothorax, ACS, and MI. Initial workup included a CBC, BMP, troponin, magnesium, chest x-ray, and an EKG.  EKG as detailed above. CBC was unremarkable. BMP had a hypokalemia with potassium of 2.4. Patient states that she has been eating and drinking poorly over the past few days because of her nausea with the chest discomfort. Patient was treated with 40 mEq of by mouth potassium and 10 mEq of IV potassium. After the first half of the IV potassium patient refused anymore stating that it caused her arm to burn. As a result we will continue to replete her  potassium by mouth. Magnesium was added on to the patient's labs and was normal at 1.7. Troponin was negative at 0.03.  Chest x-ray is unremarkable. With patient's new onset SVT which is intermittent in nature, precordial chest pain, and hypokalemia I feel that she requires admission to the hospital for further management. The patient was admitted to the hospitalist service in good condition. They requested that cardiology be consult for her SVT. Cardiology was consulted and will evaluate the patient in the emergency department. Labs and imaging reviewed by myself and considered in medical decision making. Imaging was interpreted by radiology. Care was discussed with my attending Dr. Romeo Apple.      Bethann Berkshire, MD 12/09/14 1857  Purvis Sheffield, MD 12/09/14 Ernestina Columbia

## 2014-12-10 ENCOUNTER — Other Ambulatory Visit: Payer: Self-pay

## 2014-12-10 DIAGNOSIS — I10 Essential (primary) hypertension: Secondary | ICD-10-CM | POA: Diagnosis present

## 2014-12-10 DIAGNOSIS — E876 Hypokalemia: Secondary | ICD-10-CM | POA: Diagnosis not present

## 2014-12-10 DIAGNOSIS — I471 Supraventricular tachycardia: Secondary | ICD-10-CM | POA: Diagnosis not present

## 2014-12-10 DIAGNOSIS — R079 Chest pain, unspecified: Secondary | ICD-10-CM | POA: Diagnosis not present

## 2014-12-10 DIAGNOSIS — R9431 Abnormal electrocardiogram [ECG] [EKG]: Secondary | ICD-10-CM

## 2014-12-10 DIAGNOSIS — R61 Generalized hyperhidrosis: Secondary | ICD-10-CM | POA: Diagnosis not present

## 2014-12-10 DIAGNOSIS — R072 Precordial pain: Secondary | ICD-10-CM

## 2014-12-10 LAB — BASIC METABOLIC PANEL
Anion gap: 5 (ref 5–15)
BUN: 10 mg/dL (ref 6–23)
CO2: 27 mmol/L (ref 19–32)
CREATININE: 0.7 mg/dL (ref 0.50–1.10)
Calcium: 9.1 mg/dL (ref 8.4–10.5)
Chloride: 105 mmol/L (ref 96–112)
GFR calc Af Amer: >90 mL/min (ref >90)
GFR calc non Af Amer: >90 mL/min (ref >90)
GLUCOSE: 90 mg/dL (ref 70–99)
Potassium: 4.4 mmol/L (ref 3.5–5.1)
Sodium: 137 mmol/L (ref 135–145)

## 2014-12-10 LAB — TROPONIN I: Troponin I: <0.03 ng/mL (ref <0.031)

## 2014-12-10 MED ORDER — METOPROLOL SUCCINATE ER 25 MG PO TB24: 25.0000 mg | ORAL_TABLET | Freq: Every day | ORAL | Status: DC

## 2014-12-10 MED ORDER — ALPRAZOLAM 0.5 MG PO TABS: 0.5000 mg | ORAL_TABLET | Freq: Every evening | ORAL | Status: DC | PRN

## 2014-12-10 MED ORDER — PANTOPRAZOLE SODIUM 40 MG PO TBEC
40.0000 mg | DELAYED_RELEASE_TABLET | Freq: Every day | ORAL | Status: DC
  Administered 2014-12-10: 40 mg via ORAL
  Filled 2014-12-10: qty 1

## 2014-12-10 MED ORDER — POTASSIUM CHLORIDE IN NACL 40-0.9 MEQ/L-% IV SOLN
INTRAVENOUS | Status: DC
  Administered 2014-12-10: 100 mL/h via INTRAVENOUS
  Filled 2014-12-10 (×3): qty 1000

## 2014-12-10 MED ORDER — METOPROLOL SUCCINATE ER 25 MG PO TB24
12.5000 mg | ORAL_TABLET | Freq: Every day | ORAL | Status: DC
  Administered 2014-12-10: 12.5 mg via ORAL
  Filled 2014-12-10: qty 1

## 2014-12-10 NOTE — Progress Notes (Signed)
  Echocardiogram 2D Echocardiogram has been performed.  Lauren Russo 12/10/2014, 11:17 AM

## 2014-12-10 NOTE — Progress Notes (Signed)
Pt discharged to home, condition stable, ambulatory, accompanied by daughter.

## 2014-12-10 NOTE — Progress Notes (Signed)
  [facility] PROGRESS NOTE  Lauren Russo ZOX:[id number] DOB: [birth year] DOA: 12/09/2014 PCP: Gwynneth AlimentSANDERS,[name], MD  Assessment/Plan: Chart reviewed  Principal Problem:  Reported SVT (supraventricular tachycardia):  None documented here. Await echocardiogram. Continue telemetry. Active Problems:   Chest pain:  MI ruled out. Now mainly epigastric pain. Had been on some type of antacid previously but stopped after esophageal dilatation. Will resume Protonix. Echocardiogram pending.   Normocytic anemia   Sarcoidosis of lung   Hypokalemia:  Many of her symptoms may be related to this. Repeat BMET is pending. Continue repletion. Unable to tolerate concentrated IV potassium, but tolerating potassium and maintenance IV fluids.   GERD with stricture   EKG abnormality:  Likely related to hypokalemia.   Essential hypertension, benign:  Will change to alternate antihypertensive at discharge.  Possibly home later today if echocardiogram and potassium are unremarkable.  HPI/Subjective: No nausea, vomiting, chest pain. Does have some epigastric discomfort.  No Palpitations.  Objective: Filed Vitals:   12/10/14 0500  BP: 129/85  Pulse: 90  Temp: 98.3 F (36.8 C)  Resp: 14   No intake or output data in the 24 hours ending 12/10/14 0931 Filed Weights   12/09/14 2100  Weight: 70.852 kg (156 lb 3.2 oz)    Exam:   General:  Comfortable. Alert oriented.  Cardiovascular: Regular rate rhythm without murmurs gallops rubs. No chest wall tenderness to palpation  Respiratory: Clear to auscultation bilaterally without wheezes rhonchi or rales.  Abdomen: Soft, normal bowel sounds. Mild epigastric tenderness.  Ext: No clubbing cyanosis or edema.  Basic Metabolic Panel:  Recent Labs Lab 12/09/14 1653 12/09/14 2030  NA 135  --   K 2.4* 2.6*  CL 98  --   CO2 25  --   GLUCOSE 139*  --   BUN 13  --   CREATININE 0.90  --   CALCIUM 9.3  --   MG 1.7 2.6*   Liver Function  Tests: No results for input(s): AST, ALT, ALKPHOS, BILITOT, PROT, ALBUMIN in the last 168 hours. No results for input(s): LIPASE, AMYLASE in the last 168 hours. No results for input(s): AMMONIA in the last 168 hours. CBC:  Recent Labs Lab 12/09/14 1653  WBC 7.1  NEUTROABS 2.4  HGB 13.1  HCT 38.0  MCV 85.4  PLT 337   Cardiac Enzymes:  Recent Labs Lab 12/09/14 1653 12/09/14 2030 12/10/14 0355  TROPONINI <0.03 <0.03 <0.03   BNP (last 3 results) No results for input(s): PROBNP in the last 8760 hours. CBG: No results for input(s): GLUCAP in the last 168 hours.  No results found for this or any previous visit (from the past 240 hour(s)).   Studies: Dg Chest Portable 1 View  12/09/2014   CLINICAL DATA:  Chest pain  EXAM: PORTABLE CHEST - 1 VIEW  COMPARISON:  09/12/2014  FINDINGS: The heart size and mediastinal contours are within normal limits. Both lungs are clear. The visualized skeletal structures are unremarkable.  IMPRESSION: No active disease.   Electronically Signed   By: Signa Kellaylor  Stroud M.D.   On: 12/09/2014 17:07    Scheduled Meds: . aspirin EC  325 mg Oral Daily  . pantoprazole  40 mg Oral Daily   Continuous Infusions: . 0.9 % NaCl with KCl 40 mEq / L 100 mL/hr (12/10/14 0916)    Time spent: 35 minutes  Ladrea Holladay L  [facility] [email or web address], password [email or web address] 12/10/2014, 9:31 AM  LOS: 1 day

## 2014-12-10 NOTE — Progress Notes (Signed)
Utilization Review Completed.   Libertie Hausler, RN, BSN Nurse Case Manager  

## 2014-12-10 NOTE — Progress Notes (Signed)
SUBJECTIVE:  No further chest pain.  No SOB   PHYSICAL EXAM Filed Vitals:   12/09/14 2100 12/10/14 0000 12/10/14 0400 12/10/14 0500  BP: 117/69   129/85  Pulse: 91   90  Temp: 97.8 F (36.6 C)   98.3 F (36.8 C)  TempSrc:      Resp: Height:  (1.499 m)     Weight: 156 lb 3.2 oz (70.852 kg)     SpO2: 100%   99%   General:  No distress Lungs:  Clear Heart:  RRR Abdomen:  Positive bowel sounds, no rebound no guarding Extremities:  No edema   LABS: Lab Results  Component Value Date   TROPONINI <0.03 12/10/2014   Results for orders placed or performed during the hospital encounter of 12/09/14 (from the past 24 hour(s))  CBC with Differential     Status: Abnormal   Collection Time: 12/09/14  4:53 PM  Result Value Ref Range   WBC 7.1 4.0 - 10.5 K/uL   RBC 4.45 3.87 - 5.11 MIL/uL   Hemoglobin 13.1 12.0 - 15.0 g/dL   HCT 16.1 09.6 - 04.5 %   MCV 85.4 78.0 - 100.0 fL   MCH 29.4 26.0 - 34.0 pg   MCHC 34.5 30.0 - 36.0 g/dL   RDW 40.9 81.1 - 91.4 %   Platelets 337 150 - 400 K/uL   Neutrophils Relative % 33 (L) 43 - 77 %   Neutro Abs 2.4 1.7 - 7.7 K/uL   Lymphocytes Relative 59 (H) 12 - 46 %   Lymphs Abs 4.1 (H) 0.7 - 4.0 K/uL   Monocytes Relative 7 3 - 12 %   Monocytes Absolute 0.5 0.1 - 1.0 K/uL   Eosinophils Relative 1 0 - 5 %   Eosinophils Absolute 0.1 0.0 - 0.7 K/uL   Basophils Relative 0 0 - 1 %   Basophils Absolute 0.0 0.0 - 0.1 K/uL  Basic metabolic panel     Status: Abnormal   Collection Time: 12/09/14  4:53 PM  Result Value Ref Range   Sodium 135 135 - 145 mmol/L   Potassium 2.4 (LL) 3.5 - 5.1 mmol/L   Chloride 98 96 - 112 mmol/L   CO2 25 19 - 32 mmol/L   Glucose, Bld 139 (H) 70 - 99 mg/dL   BUN 13 6 - 23 mg/dL   Creatinine, Ser 7.82 0.50 - 1.10 mg/dL   Calcium 9.3 8.4 - 95.6 mg/dL   GFR calc non Af Amer 78 (L) >90 mL/min   GFR calc Af Amer >90 >90 mL/min   Anion gap 12 5 - 15  Troponin I     Status: None   Collection Time:  12/09/14  4:53 PM  Result Value Ref Range   Troponin I <0.03 <0.031 ng/mL  Magnesium     Status: None   Collection Time: 12/09/14  4:53 PM  Result Value Ref Range   Magnesium 1.7 1.5 - 2.5 mg/dL  Troponin I (q 6hr x 3)     Status: None   Collection Time: 12/09/14  8:30 PM  Result Value Ref Range   Troponin I <0.03 <0.031 ng/mL  Potassium     Status: Abnormal   Collection Time: 12/09/14  8:30 PM  Result Value Ref Range   Potassium 2.6 (LL) 3.5 - 5.1 mmol/L  Magnesium     Status: Abnormal   Collection Time: 12/09/14  8:30 PM  Result Value Ref Range  Magnesium 2.6 (H) 1.5 - 2.5 mg/dL  Troponin I (q 6hr x 3)     Status: None   Collection Time: 12/10/14  3:55 AM  Result Value Ref Range   Troponin I <0.03 <0.031 ng/mL   No intake or output data in the 24 hours ending 12/10/14 0846  EKG:    Sinus rhythm, rate 69, axis within normal limits, intervals within normal limits, no acute ST-T wave changes.  12/10/2014   ASSESSMENT AND PLAN:  SVT:  Per EMS.  No strips recorded.  No further arrhythmias.  No further work up planned.   HTN:    BP is not an issue during this admission.     CHEST PAIN:  Atypical.  Enzymes negative.   EKG normal.    No objective evidence of ischemia.   Echo pending.  Doubt any need for further ischemia evaluation.     Fayrene FearingJames Roan[facility] Surgery Center LPochrein 12/10/2014 8:46 AM

## 2014-12-10 NOTE — Discharge Summary (Signed)
Physician Discharge Summary  Lauren Russo ZOX:[id number] DOB: [birth year] DOA: 12/09/2014  PCP: Gwynneth AlimentSANDERS,Lauren N, MD  Admit date: 12/09/2014 Discharge date: 12/10/2014  Recommendations for Outpatient Follow-up:  1. Monitor blood pressure 2. Monitor potassium  Discharge Diagnoses:  Principal Problem:  reported SVT (supraventricular tachycardia), no strips available Active Problems:   Chest pain, possibly gerd related   Normocytic anemia   Sarcoidosis of lung   Hypokalemia likely related to secondary to HCTZ   GERD with stricture   EKG abnormality, likely related to hypokalemia.   Essential hypertension, benign   Discharge Condition: stable  Filed Weights   12/09/14 2100  Weight: 70.852 kg (156 lb 3.2 oz)    History of present illness:  44 yo female h/o htn, sarcoidosis of lung comes in with 2-3 days of episodes of palpitations with associated sscp that radiates to the left arm and diaphoresis that lasts sometimes seconds, sometimes up to ten minutes then it resolves spontaneously. She has had a recent w/u for her chronic gerd and had egd showing stricture which was dilated, her gerd has gotten better and these symptoms are different that what she has previously experienced. No vomiting. No fevers. No cough. No diarrhea. No pleuritic nature to her chest pain. EMS reported periods of SVT en route to ED.  She has been in nsr since arrival to ED, has been cp free but has diffuse st seg depressions on 12 lead ekg. Has no cardiac history. No le edema or swelling.potassium found to be 2.4.  Hospital Course:  Admitted to telemetry. Cardiology consulted. No further SVT. Did have sinus tachycardia. Potassium repleted. Echocardiogram showed normal EF and no wall motion abnormality.  Patient reports having stopped her proton pump inhibitor after esophageal dilatation. Suspect discomfort may have been related to GERD, versus SVT. Due to severe hypokalemia, have stopped  hydrochlorothiazide and instead, started Toprol XL.  Will need close follow-up for blood pressure check and potassium check. Recommended patient resume her proton pump inhibitor, which she cannot recall the name of.  Procedures:  None  Consultations:  cardiology  Discharge Exam: Filed Vitals:   12/10/14 0500  BP: 129/85  Pulse: 90  Temp: 98.3 F (36.8 C)  Resp: 14    See progress note  Discharge Instructions   Discharge Instructions    Activity as tolerated - No restrictions    Complete by:  As directed      Diet - low sodium heart healthy    Complete by:  As directed           Current Discharge Medication List    START taking these medications   Details  metoprolol succinate (TOPROL-XL) 25 MG 24 hr tablet Take 1 tablet (25 mg total) by mouth daily. Qty: 30 tablet, Refills: 1      CONTINUE these medications which have CHANGED   Details  ALPRAZolam (XANAX) 0.5 MG tablet Take 1 tablet (0.5 mg total) by mouth at bedtime as needed for anxiety or sleep.      CONTINUE these medications which have NOT CHANGED   Details  vitamin C (ASCORBIC ACID) 500 MG tablet Take 500 mg by mouth daily.      STOP taking these medications     hydrochlorothiazide (MICROZIDE) 12.5 MG capsule      chlorpheniramine-HYDROcodone (TUSSIONEX) 10-8 MG/5ML LQCR      predniSONE (DELTASONE) 5 MG tablet        No Known Allergies    The results of significant diagnostics from this hospitalization (  including imaging, microbiology, ancillary and laboratory) are listed below for reference.    Significant Diagnostic Studies: Dg Chest Portable 1 View  12/09/2014   CLINICAL DATA:  Chest pain  EXAM: PORTABLE CHEST - 1 VIEW  COMPARISON:  09/12/2014  FINDINGS: The heart size and mediastinal contours are within normal limits. Both lungs are clear. The visualized skeletal structures are unremarkable.  IMPRESSION: No active disease.   Electronically Signed   By: Signa Kell M.D.   On: 12/09/2014  17:07   Dg Ugi W/high Density W/kub  11/22/2014   CLINICAL DATA:  Epigastric abdominal pain, dysphagia  EXAM: UPPER GI SERIES WITH KUB  TECHNIQUE: After obtaining a scout radiograph a routine upper GI series was performed using thin and high density barium.  FLUOROSCOPY TIME:  3 min 30 seconds  COMPARISON:  CT abdomen pelvis of 10/19/2014  FINDINGS: A preliminary film of the abdomen shows a nonspecific bowel gas pattern. A moderate amount of feces is noted throughout the colon. No opaque calculi are noted.  A double-contrast study was performed. The mucosa of the esophagus is unremarkable. The swallowing mechanism is unremarkable. However there is slight narrowing of the lower cervical esophagus, and an enlarged thyroid gland could create this appearance. Correlate clinically. Also, there is a persistent narrowing of the distal esophagus just above the gastroesophageal junction. This lesion has fairly smooth margins and probably reflects reflux esophagitis and stricture, but neoplasm cannot be excluded. Endoscopy is recommended. No definite gastroesophageal reflux could be demonstrated. A barium pill was given at the end of the study which did not pass beyond the stricture before dissolving.  The stomach is normal in contour and peristalsis. The duodenal bulb fills and the duodenal loop is in normal position.  IMPRESSION: 1. Smoothly marginated stricture over several cm of the distal esophagus most likely reflects a stricture secondary to reflux esophagitis. Neoplasm cannot be excluded and endoscopy is recommended. 2. Smooth narrowing of the lower cervical esophagus may be due to enlargement of the thyroid gland. Correlate clinically. 3. The stomach and duodenum are unremarkable.   Electronically Signed   By: Dwyane Dee M.D.   On: 11/22/2014 10:59   Mm Digital Diagnostic Bilat  11/21/2014   CLINICAL DATA:  44 year old female, callback from screening mammogram for possible bilateral asymmetries  EXAM: DIGITAL  DIAGNOSTIC  BILATERAL MAMMOGRAM WITH CAD  ULTRASOUND BILATERAL BREAST  COMPARISON:  11/02/2014, 08/29/2013  ACR Breast Density Category c: The breast tissue is heterogeneously dense, which may obscure small masses.  FINDINGS: Spot-compression view of the possible asymmetry within the lateral right breast demonstrates no definite mass, distortion, or suspicious calcifications. This finding is thought to have represented normal fibroglandular tissue.  Spot-compression view of the posterior, lateral left breast demonstrates a persistent low-density asymmetry. This area was not included in the 2014 CC view. No suspicious calcifications.  Mammographic images were processed with CAD.  On physical exam, no discrete mass is felt within the areas of concern within the lateral breasts bilaterally.  Targeted ultrasound of the entire lateral right breast demonstrates no suspicious cystic or solid sonographic finding. Targeted ultrasound of the lateral left breast demonstrates a benign intramammary lymph node at 3 o'clock, 14 cm from the nipple measuring 6 x 4 x 2 mm which may correspond to the mammographic finding. No suspicious sonographic finding was seen in this region.  IMPRESSION: 1. Probably benign left breast asymmetry. 2. No evidence of malignancy within the right breast.  RECOMMENDATION: Left diagnostic mammogram and possible ultrasound  in 6 months.  I have discussed the findings and recommendations with the patient. Results were also provided in writing at the conclusion of the visit. If applicable, a reminder letter will be sent to the patient regarding the next appointment.  BI-RADS CATEGORY  3: Probably benign.   Electronically Signed   By: Dalphine Handing M.D.   On: 11/21/2014 15:35   US Breast Ltd Uni Left Inc Axilla  11/21/2014   CLINICAL DATA:  44 year old female, callback from screening mammogram for possible bilateral asymmetries  EXAM: DIGITAL DIAGNOSTIC  BILATERAL MAMMOGRAM WITH CAD  ULTRASOUND BILATERAL  BREAST  COMPARISON:  11/02/2014, 08/29/2013  ACR Breast Density Category c: The breast tissue is heterogeneously dense, which may obscure small masses.  FINDINGS: Spot-compression view of the possible asymmetry within the lateral right breast demonstrates no definite mass, distortion, or suspicious calcifications. This finding is thought to have represented normal fibroglandular tissue.  Spot-compression view of the posterior, lateral left breast demonstrates a persistent low-density asymmetry. This area was not included in the 2014 CC view. No suspicious calcifications.  Mammographic images were processed with CAD.  On physical exam, no discrete mass is felt within the areas of concern within the lateral breasts bilaterally.  Targeted ultrasound of the entire lateral right breast demonstrates no suspicious cystic or solid sonographic finding. Targeted ultrasound of the lateral left breast demonstrates a benign intramammary lymph node at 3 o'clock, 14 cm from the nipple measuring 6 x 4 x 2 mm which may correspond to the mammographic finding. No suspicious sonographic finding was seen in this region.  IMPRESSION: 1. Probably benign left breast asymmetry. 2. No evidence of malignancy within the right breast.  RECOMMENDATION: Left diagnostic mammogram and possible ultrasound in 6 months.  I have discussed the findings and recommendations with the patient. Results were also provided in writing at the conclusion of the visit. If applicable, a reminder letter will be sent to the patient regarding the next appointment.  BI-RADS CATEGORY  3: Probably benign.   Electronically Signed   By: Dalphine Handing M.D.   On: 11/21/2014 15:35   US Breast Ltd Uni Right Inc Axilla  11/21/2014   CLINICAL DATA:  44 year old female, callback from screening mammogram for possible bilateral asymmetries  EXAM: DIGITAL DIAGNOSTIC  BILATERAL MAMMOGRAM WITH CAD  ULTRASOUND BILATERAL BREAST  COMPARISON:  11/02/2014, 08/29/2013  ACR Breast Density  Category c: The breast tissue is heterogeneously dense, which may obscure small masses.  FINDINGS: Spot-compression view of the possible asymmetry within the lateral right breast demonstrates no definite mass, distortion, or suspicious calcifications. This finding is thought to have represented normal fibroglandular tissue.  Spot-compression view of the posterior, lateral left breast demonstrates a persistent low-density asymmetry. This area was not included in the 2014 CC view. No suspicious calcifications.  Mammographic images were processed with CAD.  On physical exam, no discrete mass is felt within the areas of concern within the lateral breasts bilaterally.  Targeted ultrasound of the entire lateral right breast demonstrates no suspicious cystic or solid sonographic finding. Targeted ultrasound of the lateral left breast demonstrates a benign intramammary lymph node at 3 o'clock, 14 cm from the nipple measuring 6 x 4 x 2 mm which may correspond to the mammographic finding. No suspicious sonographic finding was seen in this region.  IMPRESSION: 1. Probably benign left breast asymmetry. 2. No evidence of malignancy within the right breast.  RECOMMENDATION: Left diagnostic mammogram and possible ultrasound in 6 months.  I have discussed the  findings and recommendations with the patient. Results were also provided in writing at the conclusion of the visit. If applicable, a reminder letter will be sent to the patient regarding the next appointment.  BI-RADS CATEGORY  3: Probably benign.   Electronically Signed   By: Dalphine Handing M.D.   On: 11/21/2014 15:35    Microbiology: No results found for this or any previous visit (from the past 240 hour(s)).   Labs: Basic Metabolic Panel:  Recent Labs Lab 12/09/14 1653 12/09/14 2030 12/10/14 0802  NA 135  --  137  K 2.4* 2.6* 4.4  CL 98  --  105  CO2 25  --  27  GLUCOSE 139*  --  90  BUN 13  --  10  CREATININE 0.90  --  0.70  CALCIUM 9.3  --  9.1  MG 1.7  2.6*  --    Liver Function Tests: No results for input(s): AST, ALT, ALKPHOS, BILITOT, PROT, ALBUMIN in the last 168 hours. No results for input(s): LIPASE, AMYLASE in the last 168 hours. No results for input(s): AMMONIA in the last 168 hours. CBC:  Recent Labs Lab 12/09/14 1653  WBC 7.1  NEUTROABS 2.4  HGB 13.1  HCT 38.0  MCV 85.4  PLT 337   Cardiac Enzymes:  Recent Labs Lab 12/09/14 1653 12/09/14 2030 12/10/14 0355 12/10/14 0802  TROPONINI <0.03 <0.03 <0.03 <0.03   BNP: BNP (last 3 results) No results for input(s): PROBNP in the last 8760 hours. CBG: No results for input(s): GLUCAP in the last 168 hours.     SignedChristiane Ha  [facility] 12/10/2014, 12:53 PM

## 2014-12-14 ENCOUNTER — Encounter: Payer: Self-pay | Admitting: Internal Medicine

## 2014-12-14 ENCOUNTER — Ambulatory Visit (INDEPENDENT_AMBULATORY_CARE_PROVIDER_SITE_OTHER): Payer: 59 | Admitting: Internal Medicine

## 2014-12-14 VITALS — BP 144/100 | HR 82 | Ht 59.0 in | Wt 160.0 lb

## 2014-12-14 DIAGNOSIS — D860 Sarcoidosis of lung: Secondary | ICD-10-CM

## 2014-12-14 DIAGNOSIS — R0789 Other chest pain: Secondary | ICD-10-CM

## 2014-12-14 DIAGNOSIS — R918 Other nonspecific abnormal finding of lung field: Secondary | ICD-10-CM

## 2014-12-14 NOTE — Progress Notes (Signed)
Subjective:    Patient ID: Lauren Russo, female    DOB: [birth year], 44 y.o.   MRN: [id number]  HPI   OV 12/14/2014  Chief Complaint  Patient presents with  . Pulmonary Consult    Recent pt of RA. Pt switched physicians to MR for sarcoid.     44 year old AA female  Originally dmitted December 14.-11/05/2012 for multiple pulmonary nodules and shortness of breath. On initial presentation had rt neck pain &  low-grade fever w/ SIRS. She was treated empirically for possible pneumonia. CT chest showed multiple bilateral lung masses and nodules with mild hilar and mediastinal lymphadenopathy . Patient underwent a CT guided lung biopsy that showed non-necrotizing granulomas with no evidence of malignancy , ACE was low , CT angio neck neg for thrombophlebitis of neck veins .  AFP and HCG low.  ANA, ANCA neg.  2-D echo showed normal left ventricular function She was discharged on prednisone 20 mg daily . Seen on 11/09/12 >> pred decreased 10 mg. Last seen in pulmonary office Feb 2014: and at that time had increased nodule size. But PFTs - FVC 96%, DLCO 68% corrects to 100% for [facility]. At that time unhappy with services in pulmonary office and left for folllowup. Had fu CXR Oct 2014 and Jan 2016 with PCP that shows complete resolution of the nodules. She has been off prednisone for a long long time ? Few years.   Now coming back to pulmonary clinic due to new onset, insidious onset  Of left sided atypical chest pain in back in inter- and infra-scapular region that is only somewhat pleuritic. Not associated with dyspnea, fever, chills, B symptoms, cough. It is persistent and moderate in intensity. CXR Jan 2016 is clear    has a past medical history of Tubal pregnancy; Hypertension; Ovarian cyst; Sarcoid; SVT (supraventricular tachycardia); and Pulmonary nodule.   reports that she has been passively smoking Cigarettes.  She has never used smokeless tobacco.  Past Surgical History  Procedure  Laterality Date  . Tubal ligation    . Laparoscopic salpingoopherectomy      unsure of which side.     No Known Allergies   There is no immunization history on file for this patient.  Family History  Problem Relation Age of Onset  . Heart disease Mother   . Heart disease Father   . Breast cancer Maternal Aunt      Current outpatient prescriptions:  .  ALPRAZolam (XANAX) 0.5 MG tablet, Take 1 tablet (0.5 mg total) by mouth at bedtime as needed for anxiety or sleep., Disp: , Rfl:  .  metoprolol succinate (TOPROL-XL) 25 MG 24 hr tablet, Take 1 tablet (25 mg total) by mouth daily., Disp: 30 tablet, Rfl: 1 .  vitamin C (ASCORBIC ACID) 500 MG tablet, Take 500 mg by mouth daily., Disp: , Rfl:      Review of Systems  Constitutional: Negative for fever and unexpected weight change.  HENT: Positive for rhinorrhea and trouble swallowing. Negative for congestion, dental problem, ear pain, nosebleeds, postnasal drip, sinus pressure, sneezing and sore throat.   Eyes: Negative for redness and itching.  Respiratory: Negative for cough, chest tightness, shortness of breath and wheezing.   Cardiovascular: Positive for chest pain. Negative for palpitations and leg swelling.  Gastrointestinal: Positive for nausea. Negative for vomiting.  Genitourinary: Negative for dysuria.  Musculoskeletal: Negative for joint swelling.  Skin: Negative for rash.  Neurological: Negative for headaches.  Hematological: Does not bruise/bleed easily.  Psychiatric/Behavioral: Negative for dysphoric mood. The patient is not nervous/anxious.     Current outpatient prescriptions:  .  ALPRAZolam (XANAX) 0.5 MG tablet, Take 1 tablet (0.5 mg total) by mouth at bedtime as needed for anxiety or sleep., Disp: , Rfl:  .  metoprolol succinate (TOPROL-XL) 25 MG 24 hr tablet, Take 1 tablet (25 mg total) by mouth daily., Disp: 30 tablet, Rfl: 1 .  vitamin C (ASCORBIC ACID) 500 MG tablet, Take 500 mg by mouth daily., Disp: ,  Rfl:       Objective:   Physical Exam  Constitutional: She is oriented to person, place, and time. She appears well-developed and well-nourished. No distress.  HENT:  Head: Normocephalic and atraumatic.  Right Ear: External ear normal.  Left Ear: External ear normal.  Mouth/Throat: Oropharynx is clear and moist. No oropharyngeal exudate.  Eyes: Conjunctivae and EOM are normal. Pupils are equal, round, and reactive to light. Right eye exhibits no discharge. Left eye exhibits no discharge. No scleral icterus.  Neck: Normal range of motion. Neck supple. No JVD present. No tracheal deviation present. No thyromegaly present.  Cardiovascular: Normal rate, regular rhythm, normal heart sounds and intact distal pulses.  Exam reveals no gallop and no friction rub.   No murmur heard. Pulmonary/Chest: Effort normal and breath sounds normal. No respiratory distress. She has no wheezes. She has no rales. She exhibits no tenderness.  Abdominal: Soft. Bowel sounds are normal. She exhibits no distension and no mass. There is no tenderness. There is no rebound and no guarding.  Musculoskeletal: Normal range of motion. She exhibits no edema or tenderness.  Lymphadenopathy:    She has no cervical adenopathy.  Neurological: She is alert and oriented to person, place, and time. She has normal reflexes. No cranial nerve deficit. She exhibits normal muscle tone. Coordination normal.  Skin: Skin is warm and dry. No rash noted. She is not diaphoretic. No erythema. No pallor.  Psychiatric: She has a normal mood and affect. Her behavior is normal. Judgment and thought content normal.  Vitals reviewed.   Filed Vitals:   12/14/14 1631  BP: 144/100  Pulse: 82  Height: 4\' 11"  (1.499 m)  Weight: 160 lb (72.576 kg)  SpO2: 97%         Assessment & Plan:     ICD-9-CM ICD-10-CM   1. Sarcoidosis of lung 135 D86.0 CT Chest Wo Contrast   517.8    2. Multiple pulmonary nodules 793.19 R91.8 CT Chest Wo Contrast    3. Left-sided chest wall pain 786.52 R07.89 CT Chest Wo Contrast    Unclear cuase of pain. Doubt due to saroid. ? Muscle knot. Will do CT chest wo contrast. IF negative, get CT and d;w her about seeing PCP for eval of MRI spine etc.,   Dr. Kalman ShanMurali Brittney Caraway, M.D., [facility] Center[facility].C.C.P Pulmonary and Critical Care Medicine Staff Physician [facility] Pager: [phone], If no answer or between  15:00h - 7:00h: call [phone]  12/17/2014 7:36 PM

## 2014-12-14 NOTE — Patient Instructions (Addendum)
ICD-9-CM ICD-10-CM   1. Sarcoidosis of lung 135 D86.0    517.8    2. Multiple pulmonary nodules 793.19 R91.8   3. Left-sided chest wall pain 786.52 R07.89    Do CT chest wo contrast Will call with results to advice next step

## 2014-12-21 ENCOUNTER — Ambulatory Visit (INDEPENDENT_AMBULATORY_CARE_PROVIDER_SITE_OTHER)
Admission: RE | Admit: 2014-12-21 | Discharge: 2014-12-21 | Disposition: A | Discharge status: Home / Self Care | Payer: 59 | Source: Ambulatory Visit | Attending: Internal Medicine | Admitting: Internal Medicine

## 2014-12-21 DIAGNOSIS — D860 Sarcoidosis of lung: Secondary | ICD-10-CM

## 2014-12-21 DIAGNOSIS — R0789 Other chest pain: Secondary | ICD-10-CM

## 2014-12-21 DIAGNOSIS — R918 Other nonspecific abnormal finding of lung field: Secondary | ICD-10-CM

## 2014-12-26 ENCOUNTER — Telehealth: Payer: Self-pay | Admitting: Internal Medicine

## 2014-12-26 NOTE — Telephone Encounter (Signed)
Calling for CT results. Advised that once MR results these we will call her.   Please advise. Thanks.

## 2014-12-27 NOTE — Telephone Encounter (Signed)
Let her know CT chest is near normal. No more nodules in lung. No more swollne glands in lung. Some mild trace scar tissue from prior nodules. Cause for discomfort is uncertain and she should explore disc or back musculoskeletal causes with her PCP Gwynneth AlimentSANDERS,[name], MD

## 2014-12-27 NOTE — Telephone Encounter (Signed)
Pt is aware of CT results. 

## 2015-05-20 ENCOUNTER — Inpatient Hospital Stay (HOSPITAL_COMMUNITY): Payer: 59

## 2015-05-20 ENCOUNTER — Inpatient Hospital Stay (HOSPITAL_COMMUNITY)
Admission: AD | Admit: 2015-05-20 | Discharge: 2015-05-20 | Disposition: A | Discharge status: Home / Self Care | Payer: 59 | Source: Ambulatory Visit | Attending: Obstetrics and Gynecology | Admitting: Obstetrics and Gynecology

## 2015-05-20 ENCOUNTER — Encounter (HOSPITAL_COMMUNITY): Payer: Self-pay | Admitting: *Deleted

## 2015-05-20 DIAGNOSIS — N8320 Unspecified ovarian cysts: Secondary | ICD-10-CM | POA: Diagnosis not present

## 2015-05-20 DIAGNOSIS — R102 Pelvic and perineal pain: Secondary | ICD-10-CM | POA: Insufficient documentation

## 2015-05-20 DIAGNOSIS — I10 Essential (primary) hypertension: Secondary | ICD-10-CM | POA: Insufficient documentation

## 2015-05-20 DIAGNOSIS — D869 Sarcoidosis, unspecified: Secondary | ICD-10-CM | POA: Diagnosis not present

## 2015-05-20 DIAGNOSIS — N83209 Unspecified ovarian cyst, unspecified side: Secondary | ICD-10-CM

## 2015-05-20 DIAGNOSIS — R1032 Left lower quadrant pain: Secondary | ICD-10-CM | POA: Diagnosis present

## 2015-05-20 LAB — WET PREP, GENITAL
Trich, Wet Prep: NONE SEEN
Yeast Wet Prep HPF POC: NONE SEEN

## 2015-05-20 LAB — URINALYSIS, ROUTINE W REFLEX MICROSCOPIC
Bilirubin Urine: NEGATIVE
GLUCOSE, UA: NEGATIVE mg/dL
Hgb urine dipstick: NEGATIVE
KETONES UR: NEGATIVE mg/dL
Leukocytes, UA: NEGATIVE
Nitrite: NEGATIVE
Protein, ur: NEGATIVE mg/dL
SPECIFIC GRAVITY, URINE: 1.025 (ref 1.005–1.030)
Urobilinogen, UA: 0.2 mg/dL (ref 0.0–1.0)
pH: 7 (ref 5.0–8.0)

## 2015-05-20 LAB — CBC WITH DIFFERENTIAL/PLATELET
Basophils Absolute: 0 10*3/uL (ref 0.0–0.1)
Basophils Relative: 0 % (ref 0–1)
EOS ABS: 0.1 10*3/uL (ref 0.0–0.7)
EOS PCT: 1 % (ref 0–5)
HCT: 36.1 % (ref 36.0–46.0)
HEMOGLOBIN: 12.1 g/dL (ref 12.0–15.0)
LYMPHS PCT: 40 % (ref 12–46)
Lymphs Abs: 2.6 10*3/uL (ref 0.7–4.0)
MCH: 29.6 pg (ref 26.0–34.0)
MCHC: 33.5 g/dL (ref 30.0–36.0)
MCV: 88.3 fL (ref 78.0–100.0)
MONOS PCT: 7 % (ref 3–12)
Monocytes Absolute: 0.5 10*3/uL (ref 0.1–1.0)
NEUTROS PCT: 52 % (ref 43–77)
Neutro Abs: 3.3 10*3/uL (ref 1.7–7.7)
PLATELETS: 221 10*3/uL (ref 150–400)
RBC: 4.09 MIL/uL (ref 3.87–5.11)
RDW: 14.8 % (ref 11.5–15.5)
WBC: 6.4 10*3/uL (ref 4.0–10.5)

## 2015-05-20 LAB — CREATININE, SERUM
Creatinine, Ser: 1.06 mg/dL — ABNORMAL HIGH (ref 0.44–1.00)
GFR calc Af Amer: >60 mL/min (ref >60)

## 2015-05-20 LAB — POCT PREGNANCY, URINE: Preg Test, Ur: NEGATIVE

## 2015-05-20 LAB — HCG, QUANTITATIVE, PREGNANCY: hCG, Beta Chain, Quant, S: <1 m[IU]/mL (ref <5)

## 2015-05-20 MED ORDER — HYDROCODONE-ACETAMINOPHEN 5-325 MG PO TABS: 1.0000 | ORAL_TABLET | Freq: Four times a day (QID) | ORAL | Status: DC | PRN

## 2015-05-20 MED ORDER — IOHEXOL 300 MG/ML  SOLN
100.0000 mL | Freq: Once | INTRAMUSCULAR | Status: AC | PRN
  Administered 2015-05-20: 100 mL via INTRAVENOUS

## 2015-05-20 MED ORDER — OXYCODONE-ACETAMINOPHEN 5-325 MG PO TABS
1.0000 | ORAL_TABLET | Freq: Once | ORAL | Status: AC
  Administered 2015-05-20: 1 via ORAL
  Filled 2015-05-20: qty 1

## 2015-05-20 MED ORDER — CYCLOBENZAPRINE HCL 10 MG PO TABS
10.0000 mg | ORAL_TABLET | Freq: Once | ORAL | Status: DC
  Filled 2015-05-20: qty 1

## 2015-05-20 MED ORDER — IBUPROFEN 800 MG PO TABS: 800.0000 mg | ORAL_TABLET | Freq: Three times a day (TID) | ORAL | Status: DC | PRN

## 2015-05-20 NOTE — MAU Provider Note (Signed)
History     CSN: [id number]  Arrival date and time: 05/20/15 1643   First Provider Initiated Contact with Patient 05/20/15 1716      Chief Complaint  Patient presents with  . Rectal Pain  . left sided pain    HPI Comments: [id number] (TAB x1, ectopic x1), non-pregnant female c/o LLQ pain starting at 0900 today. Describes as constant, sharp and stabbing sometimes radiating into rectum. Took Ibuprofen 800 mg with little relief. Denies vaginal discharge or irritation. Widowed-not currently sexually active. Denies urinary sx. Regular BMs, last this am. No N/V. No recent fever. H/o hemorrhagic ovarian cysts. Followed by PCP for HTN and sarcoidosis.    Pertinent Gynecological History: Menses: flow is moderate, regular, q month Contraception: abstinence Sexually transmitted diseases: no past history Previous GYN Procedures: salpingectomy, BTL  Last mammogram: normal  Last pap: normal   Past Medical History  Diagnosis Date  . Tubal pregnancy   . Hypertension   . Ovarian cyst   . Sarcoid   . SVT (supraventricular tachycardia)   . Pulmonary nodule     Past Surgical History  Procedure Laterality Date  . Tubal ligation    . Laparoscopic salpingoopherectomy      unsure of which side.     Family History  Problem Relation Age of Onset  . Heart disease Mother   . Heart disease Father   . Breast cancer Maternal Aunt     History  Substance Use Topics  . Smoking status: Passive Smoke Exposure - Never Smoker    Types: Cigarettes  . Smokeless tobacco: Never Used     Comment: reports smokes socially x1 month  . Alcohol Use: 1.2 oz/week    2 Glasses of wine per week     Comment: Occassional    Allergies: No Known Allergies  Prescriptions prior to admission  Medication Sig Dispense Refill Last Dose  . ALPRAZolam (XANAX) 0.5 MG tablet Take 1 tablet (0.5 mg total) by mouth at bedtime as needed for anxiety or sleep.   Past Week at Unknown time  . metoprolol succinate (TOPROL-XL)  25 MG 24 hr tablet Take 1 tablet (25 mg total) by mouth daily. 30 tablet 1 05/19/2015 at 2000  . vitamin C (ASCORBIC ACID) 500 MG tablet Take 500 mg by mouth daily.   05/19/2015 at Unknown time    Review of Systems  Constitutional: Negative.   HENT: Negative.   Eyes: Negative.   Respiratory: Negative.   Cardiovascular: Negative.   Gastrointestinal: Positive for abdominal pain.  Genitourinary: Negative.   Musculoskeletal: Negative.   Skin: Negative.   Neurological: Negative.   Endo/Heme/Allergies: Negative.   Psychiatric/Behavioral: Negative.    Physical Exam   Blood pressure 162/91, pulse 76, temperature 98.4 F (36.9 C), resp. rate 98.  Physical Exam  Constitutional: She is oriented to person, place, and time. She appears well-developed and well-nourished.  Appears uncomfortable, lying on right side  HENT:  Head: Normocephalic and atraumatic.  Neck: Normal range of motion. Neck supple.  Cardiovascular: Normal rate and regular rhythm.   Respiratory: Effort normal and breath sounds normal.  GI: Soft. Bowel sounds are normal. She exhibits no distension and no mass. There is tenderness. There is no rebound and no guarding.  Mild tenderness rt and lt lower  Genitourinary:  Speculum: thin, white, non-odorous discharge, parous os-tenderness with speculum intsertion Bimanual: exquisite uterine tenderness, mild left adnexal tenderness, +CMT, no palpable mass-exam difficult d/t pt discomfort GC/CMT and wet prep collected  Musculoskeletal: Normal range  of motion.  Neurological: She is alert and oriented to person, place, and time.  Skin: Skin is warm and dry.  Psychiatric:  teary   Results for Lauren Russo, Lauren Russo (MRN [id number]) as of 05/20/2015 17:37  Ref. Range 05/20/2015 17:00 05/20/2015 17:11  Preg Test, Ur Latest Ref Range: NEGATIVE   NEGATIVE  Appearance Latest Ref Range: CLEAR  CLEAR   Bilirubin Urine Latest Ref Range: NEGATIVE  NEGATIVE   Color, Urine Latest Ref Range: YELLOW   YELLOW   Glucose Latest Ref Range: NEGATIVE mg/dL NEGATIVE   Hgb urine dipstick Latest Ref Range: NEGATIVE  NEGATIVE   Ketones, ur Latest Ref Range: NEGATIVE mg/dL NEGATIVE   Leukocytes, UA Latest Ref Range: NEGATIVE  NEGATIVE   Nitrite Latest Ref Range: NEGATIVE  NEGATIVE   pH Latest Ref Range: 5.0-8.0  7.0   Protein Latest Ref Range: NEGATIVE mg/dL NEGATIVE   Specific Gravity, Urine Latest Ref Range: 1.005-1.030  1.025   Urobilinogen, UA Latest Ref Range: 0.0-1.0 mg/dL 0.2   Results for Lauren Russo, Lauren Russo (MRN [id number]) as of 05/20/2015 18:15  Ref. Range 05/20/2015 17:30  WBC Latest Ref Range: 4.0-10.5 K/uL 6.4  RBC Latest Ref Range: 3.87-5.11 MIL/uL 4.09  Hemoglobin Latest Ref Range: 12.0-15.0 g/dL 08.612.1  HCT Latest Ref Range: 36.0-46.0 % 36.1  MCV Latest Ref Range: 78.0-100.0 fL 88.3  MCH Latest Ref Range: 26.0-34.0 pg 29.6  MCHC Latest Ref Range: 30.0-36.0 g/dL 57.833.5  RDW Latest Ref Range: 11.5-15.5 % 14.8  Platelets Latest Ref Range: 150-400 K/uL 221  Neutrophils Latest Ref Range: 43-77 % 52  Lymphocytes Latest Ref Range: 12-46 % 40  Monocytes Relative Latest Ref Range: 3-12 % 7  Eosinophil Latest Ref Range: 0-5 % 1  Basophil Latest Ref Range: 0-1 % 0  NEUT# Latest Ref Range: 1.7-7.7 K/uL 3.3  Lymphocyte # Latest Ref Range: 0.7-4.0 K/uL 2.6  Monocyte # Latest Ref Range: 0.1-1.0 K/uL 0.5  Eosinophils Absolute Latest Ref Range: 0.0-0.7 K/uL 0.1  Basophils Absolute Latest Ref Range: 0.0-0.1 K/uL 0.0  UPT-negative HCG-negative  TECHNIQUE: Both transabdominal and transvaginal ultrasound examinations of the pelvis were performed. Transabdominal technique was performed for global imaging of the pelvis including uterus, ovaries, adnexal regions, and pelvic cul-de-sac. It was necessary to proceed with endovaginal exam following the transabdominal exam to visualize the adnexa in greater detail.  COMPARISON: CT of the abdomen and pelvis performed 10/19/2014, and pelvic ultrasound  performed 01/02/2012  FINDINGS: Uterus  Measurements: 8.4 x 4.6 x 5.7 cm. No fibroids or other mass visualized.  Endometrium  Thickness: 1.1 cm. No focal abnormality visualized.  Right ovary  Measurements: 2.6 x 2.3 x 2.1 cm. Normal appearance/no adnexal mass.  Left ovary  Measurements: 3.5 x 1.3 x 1.4 cm. A small 1.1 cm cystic focus at the left ovary is thought reflect a normal corpus luteum, as the patient's pregnancy test was negative.  Other findings  A trace amount of fluid is noted within the pelvic cul-de-sac.  IMPRESSION: Unremarkable pelvic ultrasound. No evidence for ovarian torsion. Would follow-up the patient's beta HCG, to ensure that there is no ectopic pregnancy.  Electronically Signed  By: Roanna RaiderJeffery Chang M.D.  EXAM: CT ABDOMEN AND PELVIS WITH CONTRAST  TECHNIQUE: Multidetector CT imaging of the abdomen and pelvis was performed using the standard protocol following bolus administration of intravenous contrast.  CONTRAST: 100mL OMNIPAQUE IOHEXOL 300 MG/ML SOLN  COMPARISON: October 19, 2014  FINDINGS: The liver, spleen, pancreas, gallbladder, adrenal glands and kidneys are normal. There is no  hydronephrosis bilaterally. The aorta is normal. There is no abdominal lymphadenopathy. There is no small bowel obstruction or diverticulitis. The appendix is not seen but no inflammation is noted around cecum.  Fluid-filled bladder is normal. The uterus is normal. Free fluid is identified in the pelvis, physiologic. There is a ruptured left ovarian cyst. The lung bases are clear. No acute abnormality is identified within the visualized bones.  IMPRESSION: No acute abnormality identified in the abdomen and pelvis.  There is a ruptured left ovarian cyst with physiologic free fluid identified in the pelvis which may account for the patient's symptoms.   Electronically Signed  By: Sherian Rein M.D.  On: 05/20/2015 20:26            Vitals     Height Weight BMI (Calculated)            Interpretation Summary     CLINICAL DATA: Sharp left lower quadrant pain radiating to the rectum starting today.  EXAM: CT ABDOMEN AND PELVIS WITH CONTRAST  TECHNIQUE: Multidetector CT imaging of the abdomen and pelvis was performed using the standard protocol following bolus administration of intravenous contrast.  CONTRAST: OMNIPAQUE IOHEXOL 300 MG/ML SOLN  COMPARISON: October 19, 2014  FINDINGS: The liver, spleen, pancreas, gallbladder, adrenal glands and kidneys are normal. There is no hydronephrosis bilaterally. The aorta is normal. There is no abdominal lymphadenopathy. There is no small bowel obstruction or diverticulitis. The appendix is not seen but no inflammation is noted around cecum.  Fluid-filled bladder is normal. The uterus is normal. Free fluid is identified in the pelvis, physiologic. There is a ruptured left ovarian cyst. The lung bases are clear. No acute abnormality is identified within the visualized bones.  IMPRESSION: No acute abnormality identified in the abdomen and pelvis.  There is a ruptured left ovarian cyst with physiologic free fluid identified in the pelvis which may account for the patient's symptoms.   Electronically Signed  By: Sherian Rein M.D.  On: 05/20/2015 20:26    MAU Course  Procedures  Percocet 5/325 mg po x1-reports pain decrease from 10 to 5 on pain scale  Assessment and Plan  Pelvic pain Ruptured ovarian cyst  Discharge home Rest Vicodin 5/325 1-2 po q6 hrs prn severe pain Ibuprofen 800 mg q8 hrs prn Follow-up in 1-2 weeks at [facility]  Consult with Dr. Ruthy Dick with A/P.  Denyse Amass, Desare Duddy, N 05/20/2015, 5:27 PM

## 2015-05-20 NOTE — Discharge Instructions (Signed)
Ovarian Cyst An ovarian cyst is a fluid-filled sac that forms on an ovary. The ovaries are small organs that produce eggs in women. Various types of cysts can form on the ovaries. Most are not cancerous. Many do not cause problems, and they often go away on their own. Some may cause symptoms and require treatment. Common types of ovarian cysts include:  Functional cysts--These cysts may occur every month during the menstrual cycle. This is normal. The cysts usually go away with the next menstrual cycle if the woman does not get pregnant. Usually, there are no symptoms with a functional cyst.  Endometrioma cysts--These cysts form from the tissue that lines the uterus. They are also called "chocolate cysts" because they become filled with blood that turns brown. This type of cyst can cause pain in the lower abdomen during intercourse and with your menstrual period.  Cystadenoma cysts--This type develops from the cells on the outside of the ovary. These cysts can get very big and cause lower abdomen pain and pain with intercourse. This type of cyst can twist on itself, cut off its blood supply, and cause severe pain. It can also easily rupture and cause a lot of pain.  Dermoid cysts--This type of cyst is sometimes found in both ovaries. These cysts may contain different kinds of body tissue, such as skin, teeth, hair, or cartilage. They usually do not cause symptoms unless they get very big.  Theca lutein cysts--These cysts occur when too much of a certain hormone (human chorionic gonadotropin) is produced and overstimulates the ovaries to produce an egg. This is most common after procedures used to assist with the conception of a baby (in vitro fertilization). CAUSES   Fertility drugs can cause a condition in which multiple large cysts are formed on the ovaries. This is called ovarian hyperstimulation syndrome.  A condition called polycystic ovary syndrome can cause hormonal imbalances that can lead to  nonfunctional ovarian cysts. SIGNS AND SYMPTOMS  Many ovarian cysts do not cause symptoms. If symptoms are present, they may include:  Pelvic pain or pressure.  Pain in the lower abdomen.  Pain during sexual intercourse.  Increasing girth (swelling) of the abdomen.  Abnormal menstrual periods.  Increasing pain with menstrual periods.  Stopping having menstrual periods without being pregnant. DIAGNOSIS  These cysts are commonly found during a routine or annual pelvic exam. Tests may be ordered to find out more about the cyst. These tests may include:  Ultrasound.  X-ray of the pelvis.  CT scan.  MRI.  Blood tests. TREATMENT  Many ovarian cysts go away on their own without treatment. Your health care provider may want to check your cyst regularly for 2-3 months to see if it changes. For women in menopause, it is particularly important to monitor a cyst closely because of the higher rate of ovarian cancer in menopausal women. When treatment is needed, it may include any of the following:  A procedure to drain the cyst (aspiration). This may be done using a long needle and ultrasound. It can also be done through a laparoscopic procedure. This involves using a thin, lighted tube with a tiny camera on the end (laparoscope) inserted through a small incision.  Surgery to remove the whole cyst. This may be done using laparoscopic surgery or an open surgery involving a larger incision in the lower abdomen.  Hormone treatment or birth control pills. These methods are sometimes used to help dissolve a cyst. HOME CARE INSTRUCTIONS   Only take over-the-counter   or prescription medicines as directed by your health care provider.  Follow up with your health care provider as directed.  Get regular pelvic exams and Pap tests. SEEK MEDICAL CARE IF:   Your periods are late, irregular, or painful, or they stop.  Your pelvic pain or abdominal pain does not go away.  Your abdomen becomes  larger or swollen.  You have pressure on your bladder or trouble emptying your bladder completely.  You have pain during sexual intercourse.  You have feelings of fullness, pressure, or discomfort in your stomach.  You lose weight for no apparent reason.  You feel generally ill.  You become constipated.  You lose your appetite.  You develop acne.  You have an increase in body and facial hair.  You are gaining weight, without changing your exercise and eating habits.  You think you are pregnant. SEEK IMMEDIATE MEDICAL CARE IF:   You have increasing abdominal pain.  You feel sick to your stomach (nauseous), and you throw up (vomit).  You develop a fever that comes on suddenly.  You have abdominal pain during a bowel movement.  Your menstrual periods become heavier than usual. MAKE SURE YOU:  Understand these instructions.  Will watch your condition.  Will get help right away if you are not doing well or get worse. Document Released: 11/03/2005 Document Revised: 11/08/2013 Document Reviewed: 07/11/2013 Exit[employer] Patient Information 2015 [employer]. This information is not intended to replace advice given to you by your health care provider. Make sure you discuss any questions you have with your health care provider.  

## 2015-05-20 NOTE — MAU Note (Signed)
Pt presents to MAU with complaints of pain in the lower left area of her abdomen radiating into her rectum. Denies any vaginal bleeding or discharge

## 2015-05-22 LAB — GC/CHLAMYDIA PROBE AMP (~~LOC~~) NOT AT ARMC
Chlamydia: NEGATIVE
Neisseria Gonorrhea: NEGATIVE

## 2015-10-22 ENCOUNTER — Other Ambulatory Visit: Payer: Self-pay | Admitting: Obstetrics and Gynecology

## 2015-10-22 DIAGNOSIS — N6489 Other specified disorders of breast: Secondary | ICD-10-CM

## 2015-11-22 ENCOUNTER — Ambulatory Visit
Admission: RE | Admit: 2015-11-22 | Discharge: 2015-11-22 | Disposition: A | Discharge status: Home / Self Care | Payer: BLUE CROSS/BLUE SHIELD | Source: Ambulatory Visit | Attending: Obstetrics and Gynecology | Admitting: Obstetrics and Gynecology

## 2015-11-22 ENCOUNTER — Telehealth: Payer: Self-pay | Admitting: Internal Medicine

## 2015-11-22 DIAGNOSIS — N6489 Other specified disorders of breast: Secondary | ICD-10-CM

## 2015-11-22 NOTE — Telephone Encounter (Signed)
Called spoke with pt. Advised no available appt today. She scheduled appt to see CDY tomorrow at 9:30 for acute visit. Nothing further needed

## 2015-11-23 ENCOUNTER — Ambulatory Visit (INDEPENDENT_AMBULATORY_CARE_PROVIDER_SITE_OTHER): Payer: BLUE CROSS/BLUE SHIELD | Admitting: Internal Medicine

## 2015-11-23 ENCOUNTER — Encounter: Payer: Self-pay | Admitting: Internal Medicine

## 2015-11-23 ENCOUNTER — Ambulatory Visit (INDEPENDENT_AMBULATORY_CARE_PROVIDER_SITE_OTHER)
Admission: RE | Admit: 2015-11-23 | Discharge: 2015-11-23 | Disposition: A | Discharge status: Home / Self Care | Payer: BLUE CROSS/BLUE SHIELD | Source: Ambulatory Visit | Attending: Internal Medicine | Admitting: Internal Medicine

## 2015-11-23 VITALS — BP 138/88 | HR 93 | Ht 59.0 in | Wt 162.4 lb

## 2015-11-23 DIAGNOSIS — J0110 Acute frontal sinusitis, unspecified: Secondary | ICD-10-CM | POA: Diagnosis not present

## 2015-11-23 DIAGNOSIS — J209 Acute bronchitis, unspecified: Secondary | ICD-10-CM

## 2015-11-23 DIAGNOSIS — D860 Sarcoidosis of lung: Secondary | ICD-10-CM | POA: Diagnosis not present

## 2015-11-23 MED ORDER — METHYLPREDNISOLONE ACETATE 80 MG/ML IJ SUSP
80.0000 mg | Freq: Once | INTRAMUSCULAR | Status: AC
  Administered 2015-11-23: 80 mg via INTRAMUSCULAR

## 2015-11-23 MED ORDER — AZITHROMYCIN 250 MG PO TABS: ORAL_TABLET | ORAL | Status: DC

## 2015-11-23 MED ORDER — LEVALBUTEROL HCL 0.63 MG/3ML IN NEBU
0.6300 mg | INHALATION_SOLUTION | Freq: Once | RESPIRATORY_TRACT | Status: AC
  Administered 2015-11-23: 0.63 mg via RESPIRATORY_TRACT

## 2015-11-23 MED ORDER — PROMETHAZINE-CODEINE 6.25-10 MG/5ML PO SYRP
5.0000 mL | ORAL_SOLUTION | Freq: Four times a day (QID) | ORAL | Status: DC | PRN
Start: 2015-11-23 — End: 2016-12-26

## 2015-11-23 NOTE — Patient Instructions (Addendum)
Scripts printed for Zpak antibiotic and for cough syrup  Order CXR    Dx acute bronchitis, hx sarcoid  Neb xop 0.63  Depo 80  Make appointment to see Dr Marchelle Gearingamaswamy for 1 year follow-up, unless you need us sooner.

## 2015-11-23 NOTE — Progress Notes (Signed)
Subjective:    Patient ID: Lauren Russo, female    DOB: July 02, 1971, 45 y.o.   MRN: [id number]  HPI   OV 12/14/2014  Chief Complaint  Patient presents with  . Pulmonary Consult    Recent pt of RA. Pt switched physicians to MR for sarcoid.     12/14/2014 -86 year old AA female  Originally dmitted December 14.-11/05/2012 for multiple pulmonary nodules and shortness of breath. On initial presentation had rt neck pain &  low-grade fever w/ SIRS. She was treated empirically for possible pneumonia. CT chest showed multiple bilateral lung masses and nodules with mild hilar and mediastinal lymphadenopathy . Patient underwent a CT guided lung biopsy that showed non-necrotizing granulomas with no evidence of malignancy , ACE was low , CT angio neck neg for thrombophlebitis of neck veins .  AFP and HCG low.  ANA, ANCA neg.  2-D echo showed normal left ventricular function She was discharged on prednisone 20 mg daily . Seen on 11/09/12 >> pred decreased 10 mg. Last seen in pulmonary office Feb 2014: and at that time had increased nodule size. But PFTs - FVC 96%, DLCO 68% corrects to 100% for Va. At that time unhappy with services in pulmonary office and left for folllowup. Had fu CXR Oct 2014 and Jan 2016 with PCP that shows complete resolution of the nodules. She has been off prednisone for a long long time ? Few years.   Now coming back to pulmonary clinic due to new onset, insidious onset  Of left sided atypical chest pain in back in inter- and infra-scapular region that is only somewhat pleuritic. Not associated with dyspnea, fever, chills, B symptoms, cough. It is persistent and moderate in intensity. CXR Jan 2016 is clear  has a past medical history of Tubal pregnancy; Hypertension; Ovarian cyst; Sarcoid; SVT (supraventricular tachycardia); and Pulmonary nodule.  11/23/2015-45 year old female followed for sarcoid, multiple lung nodules( needle biopsy 2013) Acute visit-MR pt. Pt c/o cough with  green/yellow thick mucus, wheeze and SOB since Monday. Pt does not have an albuterol HFA.  Treated fever with ibuprofen. Sputum has been thick yellow green. Now frontal headache without sore throat. 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 CT chest 12/21/2014 IMPRESSION: 1. Resolution of the large pulmonary nodules in the upper lobes seen on remote comparison exam. 2. No typical findings of sarcoidosis within the lungs. 3. Peripheral bronchovascular linear thickening presumably relates to sarcoidosis. 4. No mediastinal lymphadenopathy. Electronically Signed  By: [name] M.D.  On: 12/22/2014 07:31  ROS-see HPI   Negative unless "+" Constitutional:    weight loss, night sweats, fevers, chills, fatigue, lassitude. HEENT:    + headaches, difficulty swallowing, tooth/dental problems, sore throat,       sneezing, itching, ear ache, + nasal congestion, post nasal drip, snoring CV:    chest pain, orthopnea, PND, swelling in lower extremities, anasarca,                                                     dizziness, palpitations Resp:   shortness of breath with exertion or at rest.                productive cough,   + non-productive cough, coughing up of blood.              change in  color of mucus.  wheezing.   Skin:    rash or lesions. GI:  No-   heartburn, indigestion, abdominal pain, nausea, vomiting, diarrhea,                 change in bowel habits, loss of appetite GU: dysuria, change in color of urine, no urgency or frequency.   flank pain. MS:   joint pain, stiffness, decreased range of motion, back pain. Neuro-     nothing unusual Psych:  change in mood or affect.  depression or anxiety.   memory loss.  OBJ- Physical Exam General- Alert, Oriented, Affect-appropriate, Distress- none acute Skin- rash-none, lesions- none, excoriation- none Lymphadenopathy- none Head- atraumatic            Eyes- Gross vision intact, PERRLA, conjunctivae and secretions clear             Ears- Hearing, canals-normal            Nose- Clear, no-Septal dev, mucus, polyps, erosion, perforation             Throat- Mallampati II , mucosa clear , drainage- none, tonsils- atrophic Neck- flexible , trachea midline, no stridor , thyroid nl, carotid no bruit Chest - symmetrical excursion , unlabored           Heart/CV- RRR , no murmur , no gallop  , no rub, nl s1 s2                           - JVD- none , edema- none, stasis changes- none, varices- none           Lung- clear to P&A, wheeze- none, cough + dry , dullness-none, rub- none           Chest wall-  Abd-  Br/ Gen/ Rectal- Not done, not indicated Extrem- cyanosis- none, clubbing, none, atrophy- none, strength- nl Neuro- grossly intact to observation  Assessment & Plan:     ICD-9-CM ICD-10-CM

## 2015-11-24 DIAGNOSIS — J0110 Acute frontal sinusitis, unspecified: Secondary | ICD-10-CM | POA: Insufficient documentation

## 2015-11-24 DIAGNOSIS — J209 Acute bronchitis, unspecified: Secondary | ICD-10-CM | POA: Insufficient documentation

## 2015-11-24 NOTE — Assessment & Plan Note (Signed)
No obvious clinical activity Plan-chest x-ray

## 2015-11-24 NOTE — Assessment & Plan Note (Signed)
Plan-hydration, Z-Pak

## 2016-02-05 ENCOUNTER — Ambulatory Visit
Admission: RE | Admit: 2016-02-05 | Discharge: 2016-02-05 | Disposition: A | Discharge status: Home / Self Care | Payer: BLUE CROSS/BLUE SHIELD | Source: Ambulatory Visit | Attending: Family Medicine | Admitting: Family Medicine

## 2016-02-05 ENCOUNTER — Other Ambulatory Visit: Payer: Self-pay | Admitting: Family Medicine

## 2016-02-05 DIAGNOSIS — M5412 Radiculopathy, cervical region: Secondary | ICD-10-CM

## 2016-02-05 DIAGNOSIS — M545 Low back pain: Secondary | ICD-10-CM

## 2016-03-08 DIAGNOSIS — I10 Essential (primary) hypertension: Secondary | ICD-10-CM | POA: Diagnosis not present

## 2016-03-08 DIAGNOSIS — D869 Sarcoidosis, unspecified: Secondary | ICD-10-CM | POA: Diagnosis not present

## 2016-03-08 DIAGNOSIS — H04123 Dry eye syndrome of bilateral lacrimal glands: Secondary | ICD-10-CM | POA: Diagnosis not present

## 2016-03-27 DIAGNOSIS — D869 Sarcoidosis, unspecified: Secondary | ICD-10-CM | POA: Diagnosis not present

## 2016-03-27 DIAGNOSIS — J029 Acute pharyngitis, unspecified: Secondary | ICD-10-CM | POA: Diagnosis not present

## 2016-03-27 DIAGNOSIS — I10 Essential (primary) hypertension: Secondary | ICD-10-CM | POA: Diagnosis not present

## 2016-03-27 DIAGNOSIS — I889 Nonspecific lymphadenitis, unspecified: Secondary | ICD-10-CM | POA: Diagnosis not present

## 2016-09-23 DIAGNOSIS — Z113 Encounter for screening for infections with a predominantly sexual mode of transmission: Secondary | ICD-10-CM | POA: Diagnosis not present

## 2016-09-23 DIAGNOSIS — R591 Generalized enlarged lymph nodes: Secondary | ICD-10-CM | POA: Diagnosis not present

## 2016-09-23 DIAGNOSIS — I10 Essential (primary) hypertension: Secondary | ICD-10-CM | POA: Diagnosis not present

## 2016-09-23 DIAGNOSIS — D869 Sarcoidosis, unspecified: Secondary | ICD-10-CM | POA: Diagnosis not present

## 2016-10-07 DIAGNOSIS — N6320 Unspecified lump in the left breast, unspecified quadrant: Secondary | ICD-10-CM | POA: Diagnosis not present

## 2016-10-07 DIAGNOSIS — R591 Generalized enlarged lymph nodes: Secondary | ICD-10-CM | POA: Diagnosis not present

## 2016-10-07 DIAGNOSIS — I10 Essential (primary) hypertension: Secondary | ICD-10-CM | POA: Diagnosis not present

## 2016-10-07 DIAGNOSIS — F439 Reaction to severe stress, unspecified: Secondary | ICD-10-CM | POA: Diagnosis not present

## 2016-10-08 ENCOUNTER — Other Ambulatory Visit: Payer: Self-pay | Admitting: Family Medicine

## 2016-10-08 DIAGNOSIS — N63 Unspecified lump in unspecified breast: Secondary | ICD-10-CM

## 2016-10-20 ENCOUNTER — Ambulatory Visit
Admission: RE | Admit: 2016-10-20 | Discharge: 2016-10-20 | Disposition: A | Discharge status: Home / Self Care | Payer: BLUE CROSS/BLUE SHIELD | Source: Ambulatory Visit | Attending: Family Medicine | Admitting: Family Medicine

## 2016-10-20 DIAGNOSIS — N6321 Unspecified lump in the left breast, upper outer quadrant: Secondary | ICD-10-CM | POA: Diagnosis not present

## 2016-10-20 DIAGNOSIS — N63 Unspecified lump in unspecified breast: Secondary | ICD-10-CM

## 2016-10-28 DIAGNOSIS — R102 Pelvic and perineal pain: Secondary | ICD-10-CM | POA: Diagnosis not present

## 2016-10-28 DIAGNOSIS — N898 Other specified noninflammatory disorders of vagina: Secondary | ICD-10-CM | POA: Diagnosis not present

## 2016-10-28 DIAGNOSIS — R10819 Abdominal tenderness, unspecified site: Secondary | ICD-10-CM | POA: Diagnosis not present

## 2016-10-28 DIAGNOSIS — B373 Candidiasis of vulva and vagina: Secondary | ICD-10-CM | POA: Diagnosis not present

## 2016-12-26 ENCOUNTER — Encounter: Payer: Self-pay | Admitting: Internal Medicine

## 2016-12-26 ENCOUNTER — Ambulatory Visit (INDEPENDENT_AMBULATORY_CARE_PROVIDER_SITE_OTHER)
Admission: RE | Admit: 2016-12-26 | Discharge: 2016-12-26 | Disposition: A | Discharge status: Home / Self Care | Payer: BLUE CROSS/BLUE SHIELD | Source: Ambulatory Visit | Attending: Internal Medicine | Admitting: Internal Medicine

## 2016-12-26 ENCOUNTER — Ambulatory Visit (INDEPENDENT_AMBULATORY_CARE_PROVIDER_SITE_OTHER): Payer: BLUE CROSS/BLUE SHIELD | Admitting: Internal Medicine

## 2016-12-26 ENCOUNTER — Other Ambulatory Visit: Payer: BLUE CROSS/BLUE SHIELD

## 2016-12-26 VITALS — BP 140/88 | HR 77 | Ht 59.0 in | Wt 181.2 lb

## 2016-12-26 DIAGNOSIS — R059 Cough, unspecified: Secondary | ICD-10-CM

## 2016-12-26 DIAGNOSIS — R0689 Other abnormalities of breathing: Secondary | ICD-10-CM

## 2016-12-26 DIAGNOSIS — H5789 Other specified disorders of eye and adnexa: Secondary | ICD-10-CM

## 2016-12-26 DIAGNOSIS — R5382 Chronic fatigue, unspecified: Secondary | ICD-10-CM | POA: Diagnosis not present

## 2016-12-26 DIAGNOSIS — R05 Cough: Secondary | ICD-10-CM

## 2016-12-26 DIAGNOSIS — D860 Sarcoidosis of lung: Secondary | ICD-10-CM | POA: Diagnosis not present

## 2016-12-26 DIAGNOSIS — M2550 Pain in unspecified joint: Secondary | ICD-10-CM

## 2016-12-26 DIAGNOSIS — R0600 Dyspnea, unspecified: Secondary | ICD-10-CM

## 2016-12-26 DIAGNOSIS — H578 Other specified disorders of eye and adnexa: Secondary | ICD-10-CM

## 2016-12-26 LAB — NITRIC OXIDE: NITRIC OXIDE: 19

## 2016-12-26 NOTE — Progress Notes (Signed)
Subjective:     Patient ID: Lauren Russo, female   DOB: Sep 19, 1971, 46 y.o.   MRN: [id number]  HPI    OV 12/14/2014  Chief Complaint  Patient presents with  . Pulmonary Consult    Recent pt of RA. Pt switched physicians to MR for sarcoid.     46 year old AA female  Originally dmitted December 14.-11/05/2012 for multiple pulmonary nodules and shortness of breath. On initial presentation had rt neck pain &  low-grade fever w/ SIRS. She was treated empirically for possible pneumonia. CT chest showed multiple bilateral lung masses and nodules with mild hilar and mediastinal lymphadenopathy . Patient underwent a CT guided lung biopsy that showed non-necrotizing granulomas with no evidence of malignancy , ACE was low , CT angio neck neg for thrombophlebitis of neck veins .  AFP and HCG low.  ANA, ANCA neg.  2-D echo showed normal left ventricular function  She was discharged on prednisone 20 mg daily . Seen on 11/09/12 >> pred decreased 10 mg. Last seen in pulmonary office Feb 2014: and at that time had increased nodule size. But PFTs - FVC 96%, DLCO 68% corrects to 100% for [facility]. At that time unhappy with services in pulmonary office and left for folllowup. Had fu CXR Oct 2014 and Jan 2016 with PCP that shows complete resolution of the nodules. She has been off prednisone for a long long time ? Few years.   Now coming back to pulmonary clinic due to new onset, insidious onset  Of left sided atypical chest pain in back in inter- and infra-scapular region that is only somewhat pleuritic. Not associated with dyspnea, fever, chills, B symptoms, cough. It is persistent and moderate in intensity. CXR Jan 2016 is clear    11/23/2015-46 year old female followed for sarcoid, multiple lung nodules( needle biopsy 2013) Acute visit-MR pt. Pt c/o cough with green/yellow thick mucus, wheeze and SOB since Monday. Pt does not have an albuterol HFA.  Treated fever with ibuprofen. Sputum has been thick yellow  green. Now frontal headache without sore throat. Works in [employer] and recognizes exposure risks. CT chest 12/21/2014 IMPRESSION: 1. Resolution of the large pulmonary nodules in the upper lobes seen on remote comparison exam. 2. No typical findings of sarcoidosis within the lungs. 3. Peripheral bronchovascular linear thickening presumably relates to sarcoidosis. 4. No mediastinal lymphadenopathy. Electronically Signed  By: Genevive BiStewart Edmunds M.D.  On: 12/22/2014 07:31    OV 12/26/2016  Chief Complaint  Patient presents with  . Follow-up    Pt states she is becoming more SOB. Pt c/o nasal drainage and left eye issues, axillary lymph node swelling - pt concerned it may be related to her sarcoid. Pt denies f/c/s.     46 year old Af. American female with pulmonary sarcoidosis. Last seen by myself personally 2 years ago at which time she had resolution of her pulmonary nodules and mediastinal adenopathy. Subsequently she had one episode of acute bronchitis and saw Dr. Maple HudsonYoung is one year ago. She tells me now for the last 4-6 months she's had increased fatigue, shortness of breath it's nonspecific, generalized arthralgia and also red eyes. She extremely worried about her health. She says she is a widow but just to be on the safe side she had her primary care physician rule out HIV and other sexually transmitted diseases some 4 months ago and all of this was normal. She also had ACE level checked by her primary care physician and apparently this was normal too. She is  not understanding why she's had some decline but all these tests are normal. There is no wheezing. There is some mild occasional cough. Overall symptoms are rated as moderate there is also associated fatigue  Exhaled nitric oxide today 01/05/2017: 19 ppb and normal   has a past medical history of Hypertension; Ovarian cyst; Pulmonary nodule; Sarcoid (HCC); SVT (supraventricular tachycardia) (HCC); and Tubal pregnancy.   reports that  she is a non-smoker but has been exposed to tobacco smoke. She has never used smokeless tobacco.  Past Surgical History:  Procedure Laterality Date  . LAPAROSCOPIC SALPINGOOPHERECTOMY     unsure of which side.   . TUBAL LIGATION      No Known Allergies   There is no immunization history on file for this patient.  Family History  Problem Relation Age of Onset  . Heart disease Mother   . Heart disease Father   . Breast cancer Maternal Aunt      Current Outpatient Prescriptions:  .  ALPRAZolam (XANAX) 0.5 MG tablet, Take 1 tablet (0.5 mg total) by mouth at bedtime as needed for anxiety or sleep., Disp: , Rfl:  .  ibuprofen (ADVIL,MOTRIN) 800 MG tablet, Take 1 tablet (800 mg total) by mouth every 8 (eight) hours as needed., Disp: 30 tablet, Rfl: 0 .  losartan-hydrochlorothiazide (HYZAAR) 50-12.5 MG tablet, Take 1 tablet by mouth daily., Disp: , Rfl: 3 .  vitamin C (ASCORBIC ACID) 500 MG tablet, Take 1,500 mg by mouth daily. , Disp: , Rfl:    Review of Systems     Objective:   Physical Exam  Constitutional: She is oriented to person, place, and time. She appears well-developed and well-nourished. No distress.  HENT:  Head: Normocephalic and atraumatic.  Right Ear: External ear normal.  Left Ear: External ear normal.  Mouth/Throat: Oropharynx is clear and moist. No oropharyngeal exudate.  Eyes: Conjunctivae and EOM are normal. Pupils are equal, round, and reactive to light. Right eye exhibits no discharge. Left eye exhibits no discharge. No scleral icterus.  Neck: Normal range of motion. Neck supple. No JVD present. No tracheal deviation present. No thyromegaly present.  Cardiovascular: Normal rate, regular rhythm, normal heart sounds and intact distal pulses.  Exam reveals no gallop and no friction rub.   No murmur heard. Pulmonary/Chest: Effort normal and breath sounds normal. No respiratory distress. She has no wheezes. She has no rales. She exhibits no tenderness.   Abdominal: Soft. Bowel sounds are normal. She exhibits no distension and no mass. There is no tenderness. There is no rebound and no guarding.  Musculoskeletal: Normal range of motion. She exhibits no edema or tenderness.  Lymphadenopathy:    She has no cervical adenopathy.  Neurological: She is alert and oriented to person, place, and time. She has normal reflexes. No cranial nerve deficit. She exhibits normal muscle tone. Coordination normal.  Skin: Skin is warm and dry. No rash noted. She is not diaphoretic. No erythema. No pallor.  Psychiatric: She has a normal mood and affect. Her behavior is normal. Judgment and thought content normal.  Vitals reviewed.     Vitals:   12/26/16 1547  BP: 140/88  Pulse: 77  SpO2: 99%  Weight: 181 lb 3.2 oz (82.2 kg)  Height: 4\' 11"  (1.499 m)   Estimated body mass index is 36.6 kg/m as calculated from the following:   Height as of this encounter: 4\' 11"  (1.499 m).   Weight as of this encounter: 181 lb 3.2 oz (82.2 kg).  Assessment:       ICD-9-CM ICD-10-CM   1. Sarcoidosis of lung (HCC) 135 D86.0    517.8    2. Arthralgia, unspecified joint 719.40 M25.50   3. Chronic fatigue 780.79 R53.82   4. Red eyes 379.93 H57.8   5. Dyspnea and respiratory abnormality 786.09 R06.00     R06.89   6. Cough 786.2 R05        Plan:      Unclear what is going on .Lot of new symptoms She could have ophthalmic sarcoid independent of other constitutional symptoms. No constitutional symptoms could represent sarcoidosis related constitutional symptoms versus autoimmune disease versus physical deconditioning. Exhaled nitric oxide today is normal suggesting this is not asthma   Do CXR 2 view 12/26/2016 Do blood work - Sed rate, ANA, DS-DNA, SCL-70, ssA, ssB, RF, CCP REfer Ophtalmaology MD for red-eyes  Do PFT next few weeks   Followup  next few weeks after above   Dr. Kalman Shan, M.D., [facility].C.P Pulmonary and Critical Care Medicine Staff  Physician [facility] [employer] [facility] Pager: [phone], If no answer or between  15:00h - 7:00h: call [phone]  12/26/2016 4:34 PM

## 2016-12-26 NOTE — Addendum Note (Signed)
Addended by: Sheran LuzEAST, Kelyn Koskela K on: 12/26/2016 05:56 PM   Modules accepted: Orders

## 2016-12-26 NOTE — Patient Instructions (Signed)
ICD-9-CM ICD-10-CM   1. Sarcoidosis of lung (HCC) 135 D86.0    517.8    2. Arthralgia, unspecified joint 719.40 M25.50   3. Chronic fatigue 780.79 R53.82   4. Red eyes 379.93 H57.8   5. Dyspnea and respiratory abnormality 786.09 R06.00     R06.89   6. Cough 786.2 R05    Unclear what is going on   Do feno test 12/26/2016 Do CXR 2 view 12/26/2016 Do blood work - Sed rate, ANA, DS-DNA, SCL-70, ssA, ssB, RF, CCP REfer Ophtalmaology MD for red-eyes  Do PFT next few weeks   Followup  next few weeks after above

## 2016-12-29 LAB — SJOGRENS SYNDROME-A EXTRACTABLE NUCLEAR ANTIBODY: SSA (RO) (ENA) ANTIBODY, IGG: NEGATIVE

## 2016-12-29 LAB — SJOGRENS SYNDROME-B EXTRACTABLE NUCLEAR ANTIBODY: SSB (La) (ENA) Antibody, IgG: <1

## 2016-12-29 LAB — ANA: Anti Nuclear Antibody(ANA): NEGATIVE

## 2016-12-29 LAB — ANTI-SCLERODERMA ANTIBODY: Scleroderma (Scl-70) (ENA) Antibody, IgG: <1

## 2016-12-29 LAB — RHEUMATOID FACTOR: Rheumatoid fact SerPl-aCnc: <14 [IU]/mL

## 2016-12-29 LAB — CYCLIC CITRUL PEPTIDE ANTIBODY, IGG: Cyclic Citrullin Peptide Ab: <16 U

## 2016-12-29 LAB — ANTI-DNA ANTIBODY, DOUBLE-STRANDED: ds DNA Ab: <1 [IU]/mL

## 2016-12-29 NOTE — Progress Notes (Signed)
Spoke with patient and informed her of results. Pt asked about lab results in which I informed her she will get another call when the doctor has reviewed them. Nothing further needed.

## 2016-12-30 DIAGNOSIS — L739 Follicular disorder, unspecified: Secondary | ICD-10-CM | POA: Diagnosis not present

## 2017-02-06 ENCOUNTER — Ambulatory Visit: Payer: BLUE CROSS/BLUE SHIELD | Admitting: Internal Medicine

## 2017-02-25 DIAGNOSIS — Z1151 Encounter for screening for human papillomavirus (HPV): Secondary | ICD-10-CM | POA: Diagnosis not present

## 2017-02-25 DIAGNOSIS — Z6836 Body mass index (BMI) 36.0-36.9, adult: Secondary | ICD-10-CM | POA: Diagnosis not present

## 2017-02-25 DIAGNOSIS — Z01419 Encounter for gynecological examination (general) (routine) without abnormal findings: Secondary | ICD-10-CM | POA: Diagnosis not present

## 2017-05-04 DIAGNOSIS — M79672 Pain in left foot: Secondary | ICD-10-CM | POA: Diagnosis not present

## 2017-05-15 DIAGNOSIS — R509 Fever, unspecified: Secondary | ICD-10-CM | POA: Diagnosis not present

## 2017-05-15 DIAGNOSIS — J180 Bronchopneumonia, unspecified organism: Secondary | ICD-10-CM | POA: Diagnosis not present

## 2017-05-23 ENCOUNTER — Inpatient Hospital Stay (HOSPITAL_COMMUNITY): Payer: BLUE CROSS/BLUE SHIELD

## 2017-05-23 ENCOUNTER — Inpatient Hospital Stay (HOSPITAL_COMMUNITY)
Admission: AD | Admit: 2017-05-23 | Discharge: 2017-05-23 | Disposition: A | Discharge status: Home / Self Care | Payer: BLUE CROSS/BLUE SHIELD | Source: Ambulatory Visit | Attending: Obstetrics and Gynecology | Admitting: Obstetrics and Gynecology

## 2017-05-23 DIAGNOSIS — I471 Supraventricular tachycardia: Secondary | ICD-10-CM | POA: Diagnosis not present

## 2017-05-23 DIAGNOSIS — Z8759 Personal history of other complications of pregnancy, childbirth and the puerperium: Secondary | ICD-10-CM | POA: Diagnosis not present

## 2017-05-23 DIAGNOSIS — Z90721 Acquired absence of ovaries, unilateral: Secondary | ICD-10-CM | POA: Insufficient documentation

## 2017-05-23 DIAGNOSIS — R109 Unspecified abdominal pain: Secondary | ICD-10-CM | POA: Diagnosis not present

## 2017-05-23 DIAGNOSIS — D869 Sarcoidosis, unspecified: Secondary | ICD-10-CM | POA: Insufficient documentation

## 2017-05-23 DIAGNOSIS — E876 Hypokalemia: Secondary | ICD-10-CM | POA: Insufficient documentation

## 2017-05-23 DIAGNOSIS — Z7722 Contact with and (suspected) exposure to environmental tobacco smoke (acute) (chronic): Secondary | ICD-10-CM | POA: Diagnosis not present

## 2017-05-23 DIAGNOSIS — N83202 Unspecified ovarian cyst, left side: Secondary | ICD-10-CM | POA: Diagnosis not present

## 2017-05-23 DIAGNOSIS — R110 Nausea: Secondary | ICD-10-CM | POA: Diagnosis not present

## 2017-05-23 DIAGNOSIS — Z8249 Family history of ischemic heart disease and other diseases of the circulatory system: Secondary | ICD-10-CM | POA: Insufficient documentation

## 2017-05-23 DIAGNOSIS — Z9851 Tubal ligation status: Secondary | ICD-10-CM | POA: Diagnosis not present

## 2017-05-23 DIAGNOSIS — I10 Essential (primary) hypertension: Secondary | ICD-10-CM | POA: Insufficient documentation

## 2017-05-23 DIAGNOSIS — N83292 Other ovarian cyst, left side: Secondary | ICD-10-CM

## 2017-05-23 DIAGNOSIS — Z803 Family history of malignant neoplasm of breast: Secondary | ICD-10-CM | POA: Insufficient documentation

## 2017-05-23 DIAGNOSIS — R1032 Left lower quadrant pain: Secondary | ICD-10-CM

## 2017-05-23 LAB — COMPREHENSIVE METABOLIC PANEL
ALK PHOS: 74 U/L (ref 38–126)
ALT: 18 U/L (ref 14–54)
ANION GAP: 8 (ref 5–15)
AST: 17 U/L (ref 15–41)
Albumin: 3.8 g/dL (ref 3.5–5.0)
BILIRUBIN TOTAL: 0.4 mg/dL (ref 0.3–1.2)
BUN: 19 mg/dL (ref 6–20)
CALCIUM: 9.4 mg/dL (ref 8.9–10.3)
CO2: 31 mmol/L (ref 22–32)
CREATININE: 0.79 mg/dL (ref 0.44–1.00)
Chloride: 99 mmol/L — ABNORMAL LOW (ref 101–111)
GFR calc Af Amer: >60 mL/min (ref >60)
Glucose, Bld: 112 mg/dL — ABNORMAL HIGH (ref 65–99)
Potassium: 3.2 mmol/L — ABNORMAL LOW (ref 3.5–5.1)
SODIUM: 138 mmol/L (ref 135–145)
TOTAL PROTEIN: 7.5 g/dL (ref 6.5–8.1)

## 2017-05-23 LAB — CBC WITH DIFFERENTIAL/PLATELET
BLASTS: 0 %
Band Neutrophils: 0 %
Basophils Absolute: 0 10*3/uL (ref 0.0–0.1)
Basophils Relative: 0 %
EOS PCT: 1 %
Eosinophils Absolute: 0.1 10*3/uL (ref 0.0–0.7)
HCT: 34 % — ABNORMAL LOW (ref 36.0–46.0)
HEMOGLOBIN: 11.4 g/dL — AB (ref 12.0–15.0)
LYMPHS ABS: 1.5 10*3/uL (ref 0.7–4.0)
Lymphocytes Relative: 12 %
MCH: 28.7 pg (ref 26.0–34.0)
MCHC: 33.5 g/dL (ref 30.0–36.0)
MCV: 85.6 fL (ref 78.0–100.0)
MYELOCYTES: 0 %
Metamyelocytes Relative: 0 %
Monocytes Absolute: 0.4 10*3/uL (ref 0.1–1.0)
Monocytes Relative: 3 %
Neutro Abs: 10.1 10*3/uL — ABNORMAL HIGH (ref 1.7–7.7)
Neutrophils Relative %: 84 %
Other: 0 %
Platelets: 353 10*3/uL (ref 150–400)
Promyelocytes Absolute: 0 %
RBC: 3.97 MIL/uL (ref 3.87–5.11)
RDW: 14.1 % (ref 11.5–15.5)
WBC: 12.1 10*3/uL — AB (ref 4.0–10.5)
nRBC: 0 /100 WBC

## 2017-05-23 LAB — POCT PREGNANCY, URINE: Preg Test, Ur: NEGATIVE

## 2017-05-23 MED ORDER — ONDANSETRON 8 MG PO TBDP
8.0000 mg | ORAL_TABLET | Freq: Once | ORAL | Status: AC
  Administered 2017-05-23: 8 mg via ORAL
  Filled 2017-05-23: qty 1

## 2017-05-23 MED ORDER — OXYCODONE-ACETAMINOPHEN 5-325 MG PO TABS: 1.0000 | ORAL_TABLET | ORAL | 0 refills | Status: DC | PRN

## 2017-05-23 MED ORDER — HYDROMORPHONE HCL 1 MG/ML IJ SOLN
2.0000 mg | Freq: Once | INTRAMUSCULAR | Status: AC
  Administered 2017-05-23: 2 mg via INTRAMUSCULAR
  Filled 2017-05-23: qty 2

## 2017-05-23 MED ORDER — ONDANSETRON 8 MG PO TBDP: 8.0000 mg | ORAL_TABLET | Freq: Two times a day (BID) | ORAL | 0 refills | Status: DC

## 2017-05-23 MED ORDER — TRAMADOL HCL 50 MG PO TABS: 50.0000 mg | ORAL_TABLET | Freq: Four times a day (QID) | ORAL | 0 refills | Status: DC | PRN

## 2017-05-23 MED ORDER — POTASSIUM CHLORIDE CRYS ER 20 MEQ PO TBCR: 40.0000 meq | EXTENDED_RELEASE_TABLET | Freq: Two times a day (BID) | ORAL | 0 refills | Status: DC

## 2017-05-23 NOTE — Discharge Instructions (Signed)
DASH Eating Plan °DASH stands for "Dietary Approaches to Stop Hypertension." The DASH eating plan is a healthy eating plan that has been shown to reduce high blood pressure (hypertension). It may also reduce your risk for type 2 diabetes, heart disease, and stroke. The DASH eating plan may also help with weight loss. °What are tips for following this plan? °General guidelines °· Avoid eating more than 2,300 mg (milligrams) of salt (sodium) a day. If you have hypertension, you may need to reduce your sodium intake to 1,500 mg a day. °· Limit alcohol intake to no more than 1 drink a day for nonpregnant women and 2 drinks a day for men. One drink equals 12 oz of beer, 5 oz of wine, or 1½ oz of hard liquor. °· Work with your health care provider to maintain a healthy body weight or to lose weight. Ask what an ideal weight is for you. °· Get at least 30 minutes of exercise that causes your heart to beat faster (aerobic exercise) most days of the week. Activities may include walking, swimming, or biking. °· Work with your health care provider or diet and nutrition specialist (dietitian) to adjust your eating plan to your individual calorie needs. °Reading food labels °· Check food labels for the amount of sodium per serving. Choose foods with less than 5 percent of the Daily Value of sodium. Generally, foods with less than 300 mg of sodium per serving fit into this eating plan. °· To find whole grains, look for the word "whole" as the first word in the ingredient list. °Shopping °· Buy products labeled as "low-sodium" or "no salt added." °· Buy fresh foods. Avoid canned foods and premade or frozen meals. °Cooking °· Avoid adding salt when cooking. Use salt-free seasonings or herbs instead of table salt or sea salt. Check with your health care provider or pharmacist before using salt substitutes. °· Do not fry foods. Cook foods using healthy methods such as baking, boiling, grilling, and broiling instead. °· Cook with  heart-healthy oils, such as olive, canola, soybean, or sunflower oil. °Meal planning ° °· Eat a balanced diet that includes: °? 5 or more servings of fruits and vegetables each day. At each meal, try to fill half of your plate with fruits and vegetables. °? Up to 6-8 servings of whole grains each day. °? Less than 6 oz of lean meat, poultry, or fish each day. A 3-oz serving of meat is about the same size as a deck of cards. One egg equals 1 oz. °? 2 servings of low-fat dairy each day. °? A serving of nuts, seeds, or beans 5 times each week. °? Heart-healthy fats. Healthy fats called Omega-3 fatty acids are found in foods such as flaxseeds and coldwater fish, like sardines, salmon, and mackerel. °· Limit how much you eat of the following: °? Canned or prepackaged foods. °? Food that is high in trans fat, such as fried foods. °? Food that is high in saturated fat, such as fatty meat. °? Sweets, desserts, sugary drinks, and other foods with added sugar. °? Full-fat dairy products. °· Do not salt foods before eating. °· Try to eat at least 2 vegetarian meals each week. °· Eat more home-cooked food and less restaurant, buffet, and fast food. °· When eating at a restaurant, ask that your food be prepared with less salt or no salt, if possible. °What foods are recommended? °The items listed may not be a complete list. Talk with your dietitian about what   dietary choices are best for you. Grains Whole-grain or whole-wheat bread. Whole-grain or whole-wheat pasta. Brown rice. [place]row. Bulgur. Whole-grain and low-sodium cereals. Pita bread. Low-fat, low-sodium crackers. Whole-wheat flour tortillas. Vegetables Fresh or frozen vegetables (raw, steamed, roasted, or grilled). Low-sodium or reduced-sodium tomato and vegetable juice. Low-sodium or reduced-sodium tomato sauce and tomato paste. Low-sodium or reduced-sodium canned vegetables. Fruits All fresh, dried, or frozen fruit. Canned fruit in natural juice (without  added sugar). Meat and other protein foods Skinless chicken or [place]. Ground chicken or [place]. Pork with fat trimmed off. Fish and seafood. Egg whites. Dried beans, peas, or lentils. Unsalted nuts, nut butters, and seeds. Unsalted canned beans. Lean cuts of beef with fat trimmed off. Low-sodium, lean deli meat. Dairy Low-fat (1%) or fat-free (skim) milk. Fat-free, low-fat, or reduced-fat cheeses. Nonfat, low-sodium ricotta or cottage cheese. Low-fat or nonfat yogurt. Low-fat, low-sodium cheese. Fats and oils Soft margarine without trans fats. Vegetable oil. Low-fat, reduced-fat, or light mayonnaise and salad dressings (reduced-sodium). Canola, safflower, olive, soybean, and sunflower oils. Avocado. Seasoning and other foods Herbs. Spices. Seasoning mixes without salt. Unsalted popcorn and pretzels. Fat-free sweets. What foods are not recommended? The items listed may not be a complete list. Talk with your dietitian about what dietary choices are best for you. Grains Baked goods made with fat, such as croissants, muffins, or some breads. Dry pasta or rice meal packs. Vegetables Creamed or fried vegetables. Vegetables in a cheese sauce. Regular canned vegetables (not low-sodium or reduced-sodium). Regular canned tomato sauce and paste (not low-sodium or reduced-sodium). Regular tomato and vegetable juice (not low-sodium or reduced-sodium). Angie Fava. Olives. Fruits Canned fruit in a light or heavy syrup. Fried fruit. Fruit in cream or butter sauce. Meat and other protein foods Fatty cuts of meat. Ribs. Fried meat. Berniece Salines. Sausage. Bologna and other processed lunch meats. Salami. Fatback. Hotdogs. Bratwurst. Salted nuts and seeds. Canned beans with added salt. Canned or smoked fish. Whole eggs or egg yolks. Chicken or [place] with skin. Dairy Whole or 2% milk, cream, and half-and-half. Whole or full-fat cream cheese. Whole-fat or sweetened yogurt. Full-fat cheese. Nondairy creamers. Whipped toppings.  Processed cheese and cheese spreads. Fats and oils Butter. Stick margarine. Lard. Shortening. Ghee. Bacon fat. Tropical oils, such as coconut, palm kernel, or palm oil. Seasoning and other foods Salted popcorn and pretzels. Onion salt, garlic salt, seasoned salt, table salt, and sea salt. Worcestershire sauce. Tartar sauce. Barbecue sauce. Teriyaki sauce. Soy sauce, including reduced-sodium. Steak sauce. Canned and packaged gravies. Fish sauce. Oyster sauce. Cocktail sauce. Horseradish that you find on the shelf. Ketchup. Mustard. Meat flavorings and tenderizers. Bouillon cubes. Hot sauce and Tabasco sauce. Premade or packaged marinades. Premade or packaged taco seasonings. Relishes. Regular salad dressings. Where to find more information:  National Heart, Lung, and [place]: https://wilson-eaton.com/  American Heart Association: www.heart.org Summary  The DASH eating plan is a healthy eating plan that has been shown to reduce high blood pressure (hypertension). It may also reduce your risk for type 2 diabetes, heart disease, and stroke.  With the DASH eating plan, you should limit salt (sodium) intake to 2,300 mg a day. If you have hypertension, you may need to reduce your sodium intake to 1,500 mg a day.  When on the DASH eating plan, aim to eat more fresh fruits and vegetables, whole grains, lean proteins, low-fat dairy, and heart-healthy fats.  Work with your health care provider or diet and nutrition specialist (dietitian) to adjust your eating plan to your individual  calorie needs. This information is not intended to replace advice given to you by your health care provider. Make sure you discuss any questions you have with your health care provider. Document Released: 10/23/2011 Document Revised: 10/27/2016 Document Reviewed: 10/27/2016 Elsevier Interactive Patient Education  2017 Elsevier Inc.      Abdominal Pain, Adult Many things can cause belly (abdominal) pain. Most times,  belly pain is not dangerous. Many cases of belly pain can be watched and treated at home. Sometimes belly pain is serious, though. Your doctor will try to find the cause of your belly pain. Follow these instructions at home:  Take over-the-counter and prescription medicines only as told by your doctor. Do not take medicines that help you poop (laxatives) unless told to by your doctor.  Drink enough fluid to keep your pee (urine) clear or pale yellow.  Watch your belly pain for any changes.  Keep all follow-up visits as told by your doctor. This is important. Contact a doctor if:  Your belly pain changes or gets worse.  You are not hungry, or you lose weight without trying.  You are having trouble pooping (constipated) or have watery poop (diarrhea) for more than 2-3 days.  You have pain when you pee or poop.  Your belly pain wakes you up at night.  Your pain gets worse with meals, after eating, or with certain foods.  You are throwing up and cannot keep anything down.  You have a fever. Get help right away if:  Your pain does not go away as soon as your doctor says it should.  You cannot stop throwing up.  Your pain is only in areas of your belly, such as the right side or the left lower part of the belly.  You have bloody or black poop, or poop that looks like tar.  You have very bad pain, cramping, or bloating in your belly.  You have signs of not having enough fluid or water in your body (dehydration), such as: ? Dark pee, very little pee, or no pee. ? Cracked lips. ? Dry mouth. ? Sunken eyes. ? Sleepiness. ? Weakness. This information is not intended to replace advice given to you by your health care provider. Make sure you discuss any questions you have with your health care provider. Document Released: 04/21/2008 Document Revised: 05/23/2016 Document Reviewed: 04/16/2016 Elsevier Interactive Patient Education  2017 [employer]

## 2017-05-23 NOTE — MAU Provider Note (Signed)
History     Chief Complaint  Patient presents with  . Abdominal Pain  . Nausea   Lauren Russo [id number] BF hx TL presents with intermittent sharp abdominal pain since 11:30 pm last night. Pain was sharp intermittent radiating to back. BM yesterday was nl, (+) n/V. Denies urinary sx. Last coitus 2 weeks ago, LMP father's day and was nl ,Took motrin at 12:30 am. Prior hx of LLQ pain with several ER visits  OB History    Gravida Para Term Preterm AB Living   4 2 2   2 2    SAB TAB Ectopic Multiple Live Births     1 1          Past Medical History:  Diagnosis Date  . Hypertension   . Ovarian cyst   . Pulmonary nodule   . Sarcoid (HCC)   . SVT (supraventricular tachycardia) (HCC)   . Tubal pregnancy     Past Surgical History:  Procedure Laterality Date  . LAPAROSCOPIC SALPINGOOPHERECTOMY     unsure of which side.   . TUBAL LIGATION      Family History  Problem Relation Age of Onset  . Heart disease Mother   . Heart disease Father   . Breast cancer Maternal Aunt     Social History  Substance Use Topics  . Smoking status: Passive Smoke Exposure - Never Smoker    Types: Cigarettes  . Smokeless tobacco: Never Used     Comment: reports smokes socially x1 month  . Alcohol use 1.2 oz/week    2 Glasses of wine per week     Comment: Occassional    Allergies: No Known Allergies  Prescriptions Prior to Admission  Medication Sig Dispense Refill Last Dose  . ALPRAZolam (XANAX) 0.5 MG tablet Take 1 tablet (0.5 mg total) by mouth at bedtime as needed for anxiety or sleep.   Taking  . ibuprofen (ADVIL,MOTRIN) 800 MG tablet Take 1 tablet (800 mg total) by mouth every 8 (eight) hours as needed. 30 tablet 0 Taking  . losartan-hydrochlorothiazide (HYZAAR) 50-12.5 MG tablet Take 1 tablet by mouth daily.  3 Taking  . vitamin C (ASCORBIC ACID) 500 MG tablet Take 1,500 mg by mouth daily.    Taking     Physical Exam   Blood pressure 140/85, pulse 98, temperature 97.7 F (36.5 C), resp.  rate 20, SpO2 100 %.  General appearance: alert, cooperative and mild distress Lungs: clear to auscultation bilaterally Heart: regular rate and rhythm, S1, S2 normal, no murmur, click, rub or gallop Abdomen: soft obese active BS tender LLQ w/o guarding or rebound Pelvic: cervix normal in appearance, external genitalia normal, no cervical motion tenderness, uterus normal size, shape, and consistency and tender bladder, RLQ and LLQ no adnexal mass palp but limited by body habitus Extremities: no edema, redness or tenderness in the calves or thighs ED Course  LLQ pain ? Ruptured ovarian cyst ? Diverticular disease P) G/C done. Pelvic sonogram. CBC w/ diff, CMP. Dilaudid IM for pain, urinalysis MDM  Addendum: CBC    Component Value Date/Time   WBC 12.1 (H) 05/23/2017 0729   RBC 3.97 05/23/2017 0729   HGB 11.4 (L) 05/23/2017 0729   HCT 34.0 (L) 05/23/2017 0729   PLT 353 05/23/2017 0729   MCV 85.6 05/23/2017 0729   MCH 28.7 05/23/2017 0729   MCHC 33.5 05/23/2017 0729   RDW 14.1 05/23/2017 0729   LYMPHSABS 1.5 05/23/2017 0729   MONOABS 0.4 05/23/2017 0729   EOSABS 0.1  05/23/2017 0729   BASOSABS 0.0 05/23/2017 0729   CMP Latest Ref Rng & Units 05/23/2017 05/20/2015 12/10/2014  Glucose 65 - 99 mg/dL 161(W112(H) - 90  BUN 6 - 20 mg/dL 19 - 10  Creatinine 9.600.Lauren - 1.00 mg/dL 4.540.79 0.98(J1.06(H) 1.910.70  Sodium 135 - 145 mmol/L 138 - 137  Potassium 3.5 - 5.1 mmol/L 3.2(L) - 4.4  Chloride 101 - 111 mmol/L 99(L) - 105  CO2 22 - 32 mmol/L 31 - 27  Calcium 8.9 - 10.3 mg/dL 9.4 - 9.1  Total Protein 6.5 - 8.1 g/dL 7.5 - -  Total Bilirubin 0.3 - 1.2 mg/dL 0.4 - -  Alkaline Phos 38 - 126 U/L 74 - -  AST 15 - 41 U/L 17 - -  ALT 14 - 54 U/L 18 - -   [place] Transvaginal Non-ob  Result Date: 05/23/2017 CLINICAL DATA:  Patient with left lower quadrant pain, nausea and vomiting. History of ovarian cyst. EXAM: TRANSABDOMINAL AND TRANSVAGINAL ULTRASOUND OF PELVIS TECHNIQUE: Both transabdominal and transvaginal ultrasound  examinations of the pelvis were performed. Transabdominal technique was performed for global imaging of the pelvis including uterus, ovaries, adnexal regions, and pelvic cul-de-sac. It was necessary to proceed with endovaginal exam following the transabdominal exam to visualize the endometrium and adnexal structures. COMPARISON:  CT abdomen pelvis 05/20/2015 FINDINGS: Uterus Measurements: 9.4 x 4.4 x 5.0 cm. No fibroids or other mass visualized. Endometrium Thickness: 16 mm.  No focal abnormality visualized. Right ovary Measurements: 3.2 x 1.2 x 2.0 cm. Normal appearance/no adnexal mass. Left ovary Measurements: 5.2 x 6.1 x 5.2 cm. There is a 5.3 x 5.3 x 5.1 cm cyst within the left ovary. There suggestion of possible peripheral nodularity (image 53). Other findings Small amount of free fluid in the pelvis. IMPRESSION: There is a 5.3 cm cyst within the left adnexa. There is suggestion of possible nodularity along the wall of the cyst. Recommend initial follow-up pelvic ultrasound in 6- 8 weeks to assess for interval change/resolution. If nodularity persists, surgical consultation would be warranted. Small amount of pelvic free fluid. Electronically Signed   By: Annia Beltrew  Davis M.D.   On: 05/23/2017 08:49   [place] Pelvis Complete  Result Date: 05/23/2017 CLINICAL DATA:  Patient with left lower quadrant pain, nausea and vomiting. History of ovarian cyst. EXAM: TRANSABDOMINAL AND TRANSVAGINAL ULTRASOUND OF PELVIS TECHNIQUE: Both transabdominal and transvaginal ultrasound examinations of the pelvis were performed. Transabdominal technique was performed for global imaging of the pelvis including uterus, ovaries, adnexal regions, and pelvic cul-de-sac. It was necessary to proceed with endovaginal exam following the transabdominal exam to visualize the endometrium and adnexal structures. COMPARISON:  CT abdomen pelvis 05/20/2015 FINDINGS: Uterus Measurements: 9.4 x 4.4 x 5.0 cm. No fibroids or other mass visualized. Endometrium  Thickness: 16 mm.  No focal abnormality visualized. Right ovary Measurements: 3.2 x 1.2 x 2.0 cm. Normal appearance/no adnexal mass. Left ovary Measurements: 5.2 x 6.1 x 5.2 cm. There is a 5.3 x 5.3 x 5.1 cm cyst within the left ovary. There suggestion of possible peripheral nodularity (image 53). Other findings Small amount of free fluid in the pelvis. IMPRESSION: There is a 5.3 cm cyst within the left adnexa. There is suggestion of possible nodularity along the wall of the cyst. Recommend initial follow-up pelvic ultrasound in 6- 8 weeks to assess for interval change/resolution. If nodularity persists, surgical consultation would be warranted. Small amount of pelvic free fluid. Electronically Signed   By: Annia Beltrew  Davis M.D.   On: 05/23/2017  08:49  IMP: left ovarian cyst LLQ pain Hypokalemia sono findings disc with pt. Advised of the ovarian cyst and need for f/u after 2 cycles( 06/2017) Also disc lab finding of low K due to HCTZ intake. Pt is not on K supplement P) d/c home. zofran for nausea. Ultram /percocet for pain. Potassium supplement x 3 days. Disc diet intake of potassium containing foods  Eldred Lievanos A, MD 7:28 AM 05/23/2017

## 2017-05-23 NOTE — MAU Note (Signed)
Pt. Presented with a "pinching" pain in the lower left side of her abd., radiating to lower back.  Pain started around 0030 this morning, pt. Self-medicated with Ibprofen 800mg , "ease pain a little bit and I went to sleep but in 4 hours pain was back". Pt. Then started vomiting, vomit one time 45 minutes ago.  Pain 10/10.

## 2017-05-25 LAB — CERVICOVAGINAL ANCILLARY ONLY
Chlamydia: NEGATIVE
NEISSERIA GONORRHEA: NEGATIVE

## 2017-06-16 ENCOUNTER — Other Ambulatory Visit: Payer: Self-pay | Admitting: Obstetrics and Gynecology

## 2017-07-01 DIAGNOSIS — N9089 Other specified noninflammatory disorders of vulva and perineum: Secondary | ICD-10-CM | POA: Diagnosis not present

## 2017-10-06 DIAGNOSIS — H9202 Otalgia, left ear: Secondary | ICD-10-CM | POA: Diagnosis not present

## 2017-10-06 DIAGNOSIS — I10 Essential (primary) hypertension: Secondary | ICD-10-CM | POA: Diagnosis not present

## 2017-11-06 DIAGNOSIS — H5213 Myopia, bilateral: Secondary | ICD-10-CM | POA: Diagnosis not present

## 2017-11-06 DIAGNOSIS — H524 Presbyopia: Secondary | ICD-10-CM | POA: Diagnosis not present

## 2017-11-06 DIAGNOSIS — D869 Sarcoidosis, unspecified: Secondary | ICD-10-CM | POA: Diagnosis not present

## 2017-11-06 DIAGNOSIS — H52203 Unspecified astigmatism, bilateral: Secondary | ICD-10-CM | POA: Diagnosis not present

## 2017-11-30 DIAGNOSIS — R509 Fever, unspecified: Secondary | ICD-10-CM | POA: Diagnosis not present

## 2018-01-23 ENCOUNTER — Emergency Department (HOSPITAL_COMMUNITY): Payer: BLUE CROSS/BLUE SHIELD

## 2018-01-23 ENCOUNTER — Encounter (HOSPITAL_COMMUNITY): Payer: Self-pay | Admitting: Emergency Medicine

## 2018-01-23 ENCOUNTER — Emergency Department (HOSPITAL_COMMUNITY)
Admission: EM | Admit: 2018-01-23 | Discharge: 2018-01-24 | Disposition: A | Discharge status: Home / Self Care | Payer: BLUE CROSS/BLUE SHIELD | Attending: Emergency Medicine | Admitting: Emergency Medicine

## 2018-01-23 DIAGNOSIS — E876 Hypokalemia: Secondary | ICD-10-CM | POA: Diagnosis not present

## 2018-01-23 DIAGNOSIS — M25512 Pain in left shoulder: Secondary | ICD-10-CM | POA: Diagnosis not present

## 2018-01-23 DIAGNOSIS — Z79899 Other long term (current) drug therapy: Secondary | ICD-10-CM | POA: Diagnosis not present

## 2018-01-23 DIAGNOSIS — Z7722 Contact with and (suspected) exposure to environmental tobacco smoke (acute) (chronic): Secondary | ICD-10-CM | POA: Diagnosis not present

## 2018-01-23 DIAGNOSIS — R079 Chest pain, unspecified: Secondary | ICD-10-CM | POA: Diagnosis not present

## 2018-01-23 DIAGNOSIS — I10 Essential (primary) hypertension: Secondary | ICD-10-CM | POA: Insufficient documentation

## 2018-01-23 DIAGNOSIS — R51 Headache: Secondary | ICD-10-CM | POA: Insufficient documentation

## 2018-01-23 DIAGNOSIS — M5489 Other dorsalgia: Secondary | ICD-10-CM | POA: Diagnosis not present

## 2018-01-23 DIAGNOSIS — M79602 Pain in left arm: Secondary | ICD-10-CM | POA: Diagnosis not present

## 2018-01-23 DIAGNOSIS — I471 Supraventricular tachycardia: Secondary | ICD-10-CM | POA: Diagnosis not present

## 2018-01-23 DIAGNOSIS — R0789 Other chest pain: Secondary | ICD-10-CM | POA: Diagnosis not present

## 2018-01-23 DIAGNOSIS — R52 Pain, unspecified: Secondary | ICD-10-CM | POA: Diagnosis not present

## 2018-01-23 LAB — BASIC METABOLIC PANEL
Anion gap: 11 (ref 5–15)
BUN: 13 mg/dL (ref 6–20)
CO2: 27 mmol/L (ref 22–32)
CREATININE: 0.81 mg/dL (ref 0.44–1.00)
Calcium: 9.3 mg/dL (ref 8.9–10.3)
Chloride: 99 mmol/L — ABNORMAL LOW (ref 101–111)
GFR calc Af Amer: >60 mL/min (ref >60)
GLUCOSE: 128 mg/dL — AB (ref 65–99)
Potassium: 3.2 mmol/L — ABNORMAL LOW (ref 3.5–5.1)
SODIUM: 137 mmol/L (ref 135–145)

## 2018-01-23 LAB — CBC
HCT: 35.6 % — ABNORMAL LOW (ref 36.0–46.0)
Hemoglobin: 11.7 g/dL — ABNORMAL LOW (ref 12.0–15.0)
MCH: 29 pg (ref 26.0–34.0)
MCHC: 32.9 g/dL (ref 30.0–36.0)
MCV: 88.1 fL (ref 78.0–100.0)
PLATELETS: 334 10*3/uL (ref 150–400)
RBC: 4.04 MIL/uL (ref 3.87–5.11)
RDW: 14.5 % (ref 11.5–15.5)
WBC: 8.8 10*3/uL (ref 4.0–10.5)

## 2018-01-23 LAB — I-STAT TROPONIN, ED: Troponin i, poc: 0 ng/mL (ref 0.00–0.08)

## 2018-01-23 LAB — I-STAT BETA HCG BLOOD, ED (MC, WL, AP ONLY)

## 2018-01-23 MED ORDER — KETOROLAC TROMETHAMINE 30 MG/ML IJ SOLN
15.0000 mg | Freq: Once | INTRAMUSCULAR | Status: AC
  Administered 2018-01-23: 15 mg via INTRAVENOUS
  Filled 2018-01-23: qty 1

## 2018-01-23 MED ORDER — SODIUM CHLORIDE 0.9 % IV BOLUS (SEPSIS)
500.0000 mL | Freq: Once | INTRAVENOUS | Status: AC
  Administered 2018-01-23: 500 mL via INTRAVENOUS

## 2018-01-23 MED ORDER — POTASSIUM CHLORIDE CRYS ER 20 MEQ PO TBCR
40.0000 meq | EXTENDED_RELEASE_TABLET | Freq: Once | ORAL | Status: AC
  Administered 2018-01-23: 40 meq via ORAL
  Filled 2018-01-23: qty 2

## 2018-01-23 MED ORDER — MORPHINE SULFATE (PF) 4 MG/ML IV SOLN
4.0000 mg | Freq: Once | INTRAVENOUS | Status: DC
  Filled 2018-01-23: qty 1

## 2018-01-23 MED ORDER — ONDANSETRON HCL 4 MG/2ML IJ SOLN
4.0000 mg | Freq: Once | INTRAMUSCULAR | Status: AC
  Administered 2018-01-23: 4 mg via INTRAVENOUS
  Filled 2018-01-23: qty 2

## 2018-01-23 NOTE — ED Provider Notes (Signed)
[facility] EMERGENCY DEPARTMENT Provider Note   CSN: [id number] Arrival date & time: 01/23/18  2227     History   Chief Complaint Chief Complaint  Patient presents with  . Shoulder Pain    HPI Lauren Russo is a 47 y.o. female.  Patient presents to the emergency department for evaluation of multiple problems.  Patient reports that she has been experiencing left-sided pain for several days.  She reports of pain around the left shoulder into the upper left chest and down the arm.  She does have some associated tingling of the arm.  She also reports a dull aching pain in the axilla and across her breast area on the left side.  Symptoms have been intermittent but more frequent today.  She also has been experiencing low back pain for a couple of weeks.  Pain is across the lower back, more on the left side.  She reports that she has having some intermittent numbness of her left leg.  This cycles over a couple of minutes, coming on and then resolving.  She has not noticed any weakness.  She has had intermittent headache.      Past Medical History:  Diagnosis Date  . Hypertension   . Ovarian cyst   . Pulmonary nodule   . Sarcoid   . SVT (supraventricular tachycardia) (HCC)   . Tubal pregnancy     Patient Active Problem List   Diagnosis Date Noted  . Acute bronchitis 11/24/2015  . Sinusitis, acute frontal 11/24/2015  . Left-sided chest wall pain 12/14/2014  . Essential hypertension, benign 12/10/2014  . Pain in the chest   . SVT (supraventricular tachycardia) (HCC) 12/09/2014  . Hypokalemia 12/09/2014  . GERD with stricture 12/09/2014  . EKG abnormality 12/09/2014  . Sarcoidosis of lung (HCC) 11/16/2012  . Lung mass 10/31/2012  . Chest pain 10/31/2012  . Normocytic anemia 10/31/2012  . Hyperglycemia 10/31/2012  . Mediastinal adenopathy 10/31/2012  . Hilar adenopathy 10/31/2012  . Multiple pulmonary nodules 10/31/2012    Past Surgical History:    Procedure Laterality Date  . LAPAROSCOPIC SALPINGOOPHERECTOMY     unsure of which side.   . TUBAL LIGATION      OB History    Gravida Para Term Preterm AB Living   4 2 2   2 2    SAB TAB Ectopic Multiple Live Births     1 1           Home Medications    Prior to Admission medications   Medication Sig Start Date End Date Taking? Authorizing Provider  losartan-hydrochlorothiazide (HYZAAR) 50-12.5 MG tablet Take 1 tablet by mouth daily. 10/13/16  Yes [provider]  ibuprofen (ADVIL,MOTRIN) 800 MG tablet Take 1 tablet (800 mg total) by mouth every 8 (eight) hours as needed for moderate pain. 01/24/18   Pollina, Canary Brim, MD  potassium chloride SA (K-DUR,KLOR-CON) 20 MEQ tablet Take 2 tablets (40 mEq total) by mouth 2 (two) times daily. Patient not taking: Reported on 01/24/2018 05/23/17 01/25/24  Maxie Better, MD    Family History Family History  Problem Relation Age of Onset  . Heart disease Mother   . Heart disease Father   . Breast cancer Maternal Aunt     Social History Social History   Tobacco Use  . Smoking status: Passive Smoke Exposure - Never Smoker  . Smokeless tobacco: Never Used  . Tobacco comment: reports smokes socially x1 month  Substance Use Topics  . Alcohol use:  Yes    Alcohol/week: 1.2 oz    Types: 2 Glasses of wine per week    Comment: Occassional  . Drug use: No     Allergies   Patient has no known allergies.   Review of Systems Review of Systems  Cardiovascular: Positive for chest pain.  Musculoskeletal: Positive for back pain.  Neurological: Positive for numbness and headaches. Negative for weakness.     Physical Exam Updated Vital Signs BP 128/87   Pulse 78   Resp (!) 9   Ht 4\' 11"  (1.499 m)   Wt 82.1 kg (181 lb)   LMP  (Within Weeks)   SpO2 99%   BMI 36.56 kg/m   Physical Exam  Constitutional: She is oriented to person, place, and time. She appears well-developed and well-nourished. No distress.  HENT:   Head: Normocephalic and atraumatic.  Right Ear: Hearing normal.  Left Ear: Hearing normal.  Nose: Nose normal.  Mouth/Throat: Oropharynx is clear and moist and mucous membranes are normal.  Eyes: Conjunctivae and EOM are normal. Pupils are equal, round, and reactive to light.  Neck: Normal range of motion. Neck supple.  Cardiovascular: Regular rhythm, S1 normal and S2 normal. Exam reveals no gallop and no friction rub.  No murmur heard. Pulmonary/Chest: Effort normal and breath sounds normal. No respiratory distress. She exhibits no tenderness.  Abdominal: Soft. Normal appearance and bowel sounds are normal. There is no hepatosplenomegaly. There is no tenderness. There is no rebound, no guarding, no tenderness at McBurney's point and negative Murphy's sign. No hernia.  Musculoskeletal:       Left shoulder: She exhibits decreased range of motion and tenderness. She exhibits no deformity.       Arms: Neurological: She is alert and oriented to person, place, and time. She has normal strength. No cranial nerve deficit or sensory deficit. Coordination normal. GCS eye subscore is 4. GCS verbal subscore is 5. GCS motor subscore is 6.  Skin: Skin is warm, dry and intact. No rash noted. No cyanosis.  Psychiatric: She has a normal mood and affect. Her speech is normal and behavior is normal. Thought content normal.  Nursing note and vitals reviewed.    ED Treatments / Results  Labs (all labs ordered are listed, but only abnormal results are displayed) Labs Reviewed  BASIC METABOLIC PANEL - Abnormal; Notable for the following components:      Result Value   Potassium 3.2 (*)    Chloride 99 (*)    Glucose, Bld 128 (*)    All other components within normal limits  CBC - Abnormal; Notable for the following components:   Hemoglobin 11.7 (*)    HCT 35.6 (*)    All other components within normal limits  I-STAT TROPONIN, ED  I-STAT BETA HCG BLOOD, ED ([facility], [facility], [facility] ONLY)  I-STAT TROPONIN, ED     EKG  EKG Interpretation  Date/Time:  Saturday January 23 2018 23:48:35 EST Ventricular Rate:  79 PR Interval:    QRS Duration: 86 QT Interval:  349 QTC Calculation: 400 R Axis:   76 Text Interpretation:  Sinus rhythm Borderline T abnormalities, diffuse leads Confirmed by Gilda CreasePollina, Christopher J [id number]) on 01/23/2018 11:56:40 PM       Radiology Dg Chest 2 View  Result Date: 01/23/2018 CLINICAL DATA:  47 y/o F; left-sided chest pain radiating down the left arm and left-sided jaw intermittent for 1 week. EXAM: CHEST - 2 VIEW COMPARISON:  12/26/2016 chest radiograph FINDINGS: Stable heart size and mediastinal  contours are within normal limits. Both lungs are clear. The visualized skeletal structures are unremarkable. IMPRESSION: No acute pulmonary process identified. Electronically Signed   By: Mitzi Hansen M.D.   On: 01/23/2018 23:13   Ct Head Wo Contrast  Result Date: 01/24/2018 CLINICAL DATA:  Left shoulder blade breast and arm pain EXAM: CT HEAD WITHOUT CONTRAST TECHNIQUE: Contiguous axial images were obtained from the base of the skull through the vertex without intravenous contrast. COMPARISON:  MRI brain 12/15/2004 FINDINGS: Brain: No evidence of acute infarction, hemorrhage, hydrocephalus, extra-axial collection or mass lesion/mass effect. Vascular: No hyperdense vessel or unexpected calcification. Skull: Normal. Negative for fracture or focal lesion. Sinuses/Orbits: No acute finding. Other: None IMPRESSION: Negative non contrasted CT appearance of brain. Electronically Signed   By: Jasmine Pang M.D.   On: 01/24/2018 00:11    Procedures Procedures (including critical care time)  Medications Ordered in ED Medications  potassium chloride SA (K-DUR,KLOR-CON) CR tablet 40 mEq (40 mEq Oral Given 01/23/18 2331)  ondansetron (ZOFRAN) injection 4 mg (4 mg Intravenous Given 01/23/18 2331)  sodium chloride 0.9 % bolus 500 mL (0 mLs Intravenous Stopped 01/24/18 0130)  ketorolac  (TORADOL) 30 MG/ML injection 15 mg (15 mg Intravenous Given 01/23/18 2345)     Initial Impression / Assessment and Plan / ED Course  I have reviewed the triage vital signs and the nursing notes.  Pertinent labs & imaging results that were available during my care of the patient were reviewed by me and considered in my medical decision making (see chart for details).     Presents with chest and left shoulder and arm pain.  She clearly has musculoskeletal pain on examination.  She is holding her left arm against her side and reluctant to move it.  Pain worsens with movement at the shoulder.  The area is tender to the touch.  Patient did have some aching pain under her axillary area as well.  She has had a full cardiac workup including 2 troponins and an EKG.  No acute abnormality noted.  She has a history of similar symptoms with low potassium.  Potassium was 3.2 today, given oral replacement and will continue outpatient.  She reports that she has over-the-counter potassium to take.  Patient will be referred back to her primary care doctor for recheck, possible outpatient studies such as stress test but it is felt that she has very low risk for cardiac etiology.  Patient also complained of headache as well as numbness, tingling in the arm and leg.  The numbness would occur intermittently for only a couple of minutes and then resolved.  I suspect that this is secondary to her musculoskeletal issues.  She is having some low back pain as well as this of pain in the shoulder.  Her examination does not reveal any sensory deficit or weakness.  A CT of her head was unremarkable.  No further workup necessary at this time.  Final Clinical Impressions(s) / ED Diagnoses   Final diagnoses:  Chest wall pain  Acute pain of left shoulder  Hypokalemia    ED Discharge Orders        Ordered    ibuprofen (ADVIL,MOTRIN) 800 MG tablet  Every 8 hours PRN     01/24/18 0416       Gilda Crease,  MD 01/24/18 [phone]

## 2018-01-23 NOTE — ED Triage Notes (Signed)
Brought by ems from home for c/o left shoulder blade, left breast, left arm, and left jaw pain that has been intermittent for a week.  Seems more consistent today.  Describes as a dull pain.  Hx of the same.  Reports due to low potassium.  Taking ibuprofen at home with no relief.  Took Asa 324mg  en route.

## 2018-01-24 DIAGNOSIS — M79602 Pain in left arm: Secondary | ICD-10-CM | POA: Diagnosis not present

## 2018-01-24 LAB — I-STAT TROPONIN, ED: Troponin i, poc: 0 ng/mL (ref 0.00–0.08)

## 2018-01-24 MED ORDER — IBUPROFEN 800 MG PO TABS: 800.0000 mg | ORAL_TABLET | Freq: Three times a day (TID) | ORAL | 0 refills | Status: DC | PRN

## 2018-01-24 NOTE — ED Notes (Signed)
In room to explain delay.  States the physician already told me everything about discharge so I want to leave now.  Removed IV.  Left at this time.

## 2018-01-26 DIAGNOSIS — R0789 Other chest pain: Secondary | ICD-10-CM | POA: Diagnosis not present

## 2018-01-26 DIAGNOSIS — D869 Sarcoidosis, unspecified: Secondary | ICD-10-CM | POA: Diagnosis not present

## 2018-01-26 DIAGNOSIS — R51 Headache: Secondary | ICD-10-CM | POA: Diagnosis not present

## 2018-01-26 DIAGNOSIS — E876 Hypokalemia: Secondary | ICD-10-CM | POA: Diagnosis not present

## 2018-01-26 DIAGNOSIS — I10 Essential (primary) hypertension: Secondary | ICD-10-CM | POA: Diagnosis not present

## 2018-06-25 DIAGNOSIS — I10 Essential (primary) hypertension: Secondary | ICD-10-CM | POA: Diagnosis not present

## 2018-06-25 DIAGNOSIS — F411 Generalized anxiety disorder: Secondary | ICD-10-CM | POA: Diagnosis not present

## 2018-07-05 DIAGNOSIS — R112 Nausea with vomiting, unspecified: Secondary | ICD-10-CM | POA: Diagnosis not present

## 2018-07-05 DIAGNOSIS — R42 Dizziness and giddiness: Secondary | ICD-10-CM | POA: Diagnosis not present

## 2018-07-05 DIAGNOSIS — D869 Sarcoidosis, unspecified: Secondary | ICD-10-CM | POA: Diagnosis not present

## 2018-07-05 DIAGNOSIS — I10 Essential (primary) hypertension: Secondary | ICD-10-CM | POA: Diagnosis not present

## 2018-07-15 ENCOUNTER — Other Ambulatory Visit: Payer: Self-pay | Admitting: Family Medicine

## 2018-07-15 DIAGNOSIS — N6489 Other specified disorders of breast: Secondary | ICD-10-CM

## 2018-07-26 ENCOUNTER — Ambulatory Visit
Admission: RE | Admit: 2018-07-26 | Discharge: 2018-07-26 | Disposition: A | Discharge status: Home / Self Care | Payer: BLUE CROSS/BLUE SHIELD | Source: Ambulatory Visit | Attending: Family Medicine | Admitting: Family Medicine

## 2018-07-26 DIAGNOSIS — N6489 Other specified disorders of breast: Secondary | ICD-10-CM

## 2018-07-26 DIAGNOSIS — R922 Inconclusive mammogram: Secondary | ICD-10-CM | POA: Diagnosis not present

## 2018-07-26 DIAGNOSIS — N6320 Unspecified lump in the left breast, unspecified quadrant: Secondary | ICD-10-CM | POA: Diagnosis not present

## 2018-08-04 DIAGNOSIS — L259 Unspecified contact dermatitis, unspecified cause: Secondary | ICD-10-CM | POA: Diagnosis not present

## 2018-09-06 DIAGNOSIS — D869 Sarcoidosis, unspecified: Secondary | ICD-10-CM | POA: Diagnosis not present

## 2018-09-06 DIAGNOSIS — L605 Yellow nail syndrome: Secondary | ICD-10-CM | POA: Diagnosis not present

## 2018-09-06 DIAGNOSIS — I10 Essential (primary) hypertension: Secondary | ICD-10-CM | POA: Diagnosis not present

## 2018-09-06 DIAGNOSIS — D649 Anemia, unspecified: Secondary | ICD-10-CM | POA: Diagnosis not present

## 2018-09-23 DIAGNOSIS — L2089 Other atopic dermatitis: Secondary | ICD-10-CM | POA: Diagnosis not present

## 2018-09-23 DIAGNOSIS — B078 Other viral warts: Secondary | ICD-10-CM | POA: Diagnosis not present

## 2018-10-02 DIAGNOSIS — B349 Viral infection, unspecified: Secondary | ICD-10-CM | POA: Diagnosis not present

## 2019-07-15 ENCOUNTER — Other Ambulatory Visit: Payer: Self-pay | Admitting: Family Medicine

## 2019-07-15 ENCOUNTER — Emergency Department (HOSPITAL_COMMUNITY)
Admission: EM | Admit: 2019-07-15 | Discharge: 2019-07-15 | Disposition: A | Discharge status: Home / Self Care | Payer: Self-pay | Attending: Emergency Medicine | Admitting: Emergency Medicine

## 2019-07-15 ENCOUNTER — Emergency Department (HOSPITAL_COMMUNITY): Payer: Self-pay

## 2019-07-15 ENCOUNTER — Ambulatory Visit
Admission: RE | Admit: 2019-07-15 | Discharge: 2019-07-15 | Disposition: A | Discharge status: Home / Self Care | Payer: No Typology Code available for payment source | Source: Ambulatory Visit | Attending: Family Medicine | Admitting: Family Medicine

## 2019-07-15 ENCOUNTER — Other Ambulatory Visit: Payer: Self-pay

## 2019-07-15 DIAGNOSIS — M542 Cervicalgia: Secondary | ICD-10-CM | POA: Insufficient documentation

## 2019-07-15 DIAGNOSIS — R519 Headache, unspecified: Secondary | ICD-10-CM

## 2019-07-15 DIAGNOSIS — I10 Essential (primary) hypertension: Secondary | ICD-10-CM | POA: Insufficient documentation

## 2019-07-15 DIAGNOSIS — R202 Paresthesia of skin: Secondary | ICD-10-CM | POA: Insufficient documentation

## 2019-07-15 DIAGNOSIS — R110 Nausea: Secondary | ICD-10-CM | POA: Insufficient documentation

## 2019-07-15 DIAGNOSIS — Z79899 Other long term (current) drug therapy: Secondary | ICD-10-CM | POA: Insufficient documentation

## 2019-07-15 DIAGNOSIS — R51 Headache: Secondary | ICD-10-CM | POA: Insufficient documentation

## 2019-07-15 DIAGNOSIS — R0789 Other chest pain: Secondary | ICD-10-CM

## 2019-07-15 DIAGNOSIS — Z7722 Contact with and (suspected) exposure to environmental tobacco smoke (acute) (chronic): Secondary | ICD-10-CM | POA: Insufficient documentation

## 2019-07-15 LAB — URINALYSIS, ROUTINE W REFLEX MICROSCOPIC
Bilirubin Urine: NEGATIVE
Glucose, UA: NEGATIVE mg/dL
Hgb urine dipstick: NEGATIVE
Ketones, ur: 5 mg/dL — AB
Leukocytes,Ua: NEGATIVE
Nitrite: NEGATIVE
Protein, ur: NEGATIVE mg/dL
Specific Gravity, Urine: 1.006 (ref 1.005–1.030)
pH: 7 (ref 5.0–8.0)

## 2019-07-15 LAB — TSH: TSH: 0.593 u[IU]/mL (ref 0.350–4.500)

## 2019-07-15 LAB — CBC
HCT: 40.3 % (ref 36.0–46.0)
Hemoglobin: 13.3 g/dL (ref 12.0–15.0)
MCH: 29.6 pg (ref 26.0–34.0)
MCHC: 33 g/dL (ref 30.0–36.0)
MCV: 89.6 fL (ref 80.0–100.0)
Platelets: 353 10*3/uL (ref 150–400)
RBC: 4.5 MIL/uL (ref 3.87–5.11)
RDW: 13.5 % (ref 11.5–15.5)
WBC: 12.8 10*3/uL — ABNORMAL HIGH (ref 4.0–10.5)
nRBC: 0 % (ref 0.0–0.2)

## 2019-07-15 LAB — BASIC METABOLIC PANEL
Anion gap: 12 (ref 5–15)
BUN: 15 mg/dL (ref 6–20)
CO2: 27 mmol/L (ref 22–32)
Calcium: 9.3 mg/dL (ref 8.9–10.3)
Chloride: 97 mmol/L — ABNORMAL LOW (ref 98–111)
Creatinine, Ser: 0.9 mg/dL (ref 0.44–1.00)
GFR calc Af Amer: >60 mL/min (ref >60)
GFR calc non Af Amer: >60 mL/min (ref >60)
Glucose, Bld: 115 mg/dL — ABNORMAL HIGH (ref 70–99)
Potassium: 3.5 mmol/L (ref 3.5–5.1)
Sodium: 136 mmol/L (ref 135–145)

## 2019-07-15 LAB — TROPONIN I (HIGH SENSITIVITY)
Troponin I (High Sensitivity): 4 ng/L (ref <18)
Troponin I (High Sensitivity): 3 ng/L (ref <18)

## 2019-07-15 LAB — I-STAT BETA HCG BLOOD, ED (MC, WL, AP ONLY): I-stat hCG, quantitative: <5 m[IU]/mL (ref <5)

## 2019-07-15 LAB — MAGNESIUM: Magnesium: 1.9 mg/dL (ref 1.7–2.4)

## 2019-07-15 MED ORDER — PANTOPRAZOLE SODIUM 20 MG PO TBEC: 20.0000 mg | DELAYED_RELEASE_TABLET | Freq: Every day | ORAL | 0 refills | Status: DC

## 2019-07-15 MED ORDER — METHOCARBAMOL 500 MG PO TABS: 500.0000 mg | ORAL_TABLET | Freq: Every evening | ORAL | 0 refills | Status: DC | PRN

## 2019-07-15 MED ORDER — SODIUM CHLORIDE 0.9% FLUSH: 3.0000 mL | Freq: Once | INTRAVENOUS | Status: DC

## 2019-07-15 NOTE — Discharge Instructions (Signed)
Take protonix daily

## 2019-07-15 NOTE — ED Provider Notes (Signed)
[facility] EMERGENCY DEPARTMENT Provider Note   CSN: [id number] Arrival date & time: 07/15/19  1458     History   Chief Complaint Chief Complaint  Patient presents with   Chest Pain    HPI Lauren Russo is a 48 y.o. female presenting for evaluation of HA, chest pain, weakness, back pain, neck pain, nausea, and sweats.   Pt states she has been having intermittent symptoms for 2 months. She has followed up with PCP, placed on steroids without improvement. She states her symptoms normally all come on together. She had a normal head ct a few days ago. Pt states she has had a HA that is behind her R eye. She has neck pain, worse on L side. She has intermittent tingling of L arm, also reports numbness but states she has pain. Her CP is pain that radiates from her back under her L breast. She reports intermittent nausea, but also states she has been having heartburn sxs. She does not take anything for this. She was talking to her PCP today when she suddenly felt very hot/sweaty, and it was recommended she come to the ED. Currently she is having tingling and nausea, no other sxs. She denies fevers, st, cough, sob, abd pain, or abnormal BMs. She has been having urinary frequency for the past few days.  Pt reports a h/o sarcoid of the lungs. Is not on anything chronically, takes prednisone for flares. H/o HTN for which she takes medication. No other medical problems.     HPI  Past Medical History:  Diagnosis Date   Hypertension    Ovarian cyst    Pulmonary nodule    Sarcoid    SVT (supraventricular tachycardia) (HCC)    Tubal pregnancy     Patient Active Problem List   Diagnosis Date Noted   Acute bronchitis 11/24/2015   Sinusitis, acute frontal 11/24/2015   Left-sided chest wall pain 12/14/2014   Essential hypertension, benign 12/10/2014   Pain in the chest    SVT (supraventricular tachycardia) (HCC) 12/09/2014   Hypokalemia 12/09/2014   GERD  with stricture 12/09/2014   EKG abnormality 12/09/2014   Sarcoidosis of lung (HCC) 11/16/2012   Lung mass 10/31/2012   Chest pain 10/31/2012   Normocytic anemia 10/31/2012   Hyperglycemia 10/31/2012   Mediastinal adenopathy 10/31/2012   Hilar adenopathy 10/31/2012   Multiple pulmonary nodules 10/31/2012    Past Surgical History:  Procedure Laterality Date   LAPAROSCOPIC SALPINGOOPHERECTOMY     unsure of which side.    TUBAL LIGATION       OB History    Gravida  4   Para  2   Term  2   Preterm      AB  2   Living  2     SAB      TAB  1   Ectopic  1   Multiple      Live Births               Home Medications    Prior to Admission medications   Medication Sig Start Date End Date Taking? Authorizing Provider  acetaminophen (TYLENOL) 500 MG tablet Take 500 mg by mouth every 6 (six) hours as needed for headache (pain).   Yes [provider]  augmented betamethasone dipropionate (DIPROLENE-AF) 0.05 % ointment Apply 1 application topically daily as needed (eczema flares).  05/05/19  Yes [provider]  hydrochlorothiazide (HYDRODIURIL) 12.5 MG tablet Take 12.5 mg by mouth  every morning. 02/01/19  Yes [provider]  losartan (COZAAR) 100 MG tablet Take 50 mg by mouth every morning. 02/09/19  Yes [provider]  predniSONE (DELTASONE) 10 MG tablet Take 10-60 mg by mouth See admin instructions. Tapered course started 07/06/19: take 6 tablets (60 mg) by mouth at noon on days 1 &2, then take 5 tablets (50 mg) on days 3&4, then take 4 tablets (40 mg) on days 5&6, then take 3 tablets (30 mg) on days 7&8, then take 2 tablets (20 mg) on days 9&10, then take 1 tablet (10 mg) on days 11&12, then stop 07/06/19  Yes [provider]  Probiotic Product (PROBIOTIC PO) Take 1 tablet by mouth daily.   Yes [provider]  tacrolimus (PROTOPIC) 0.1 % ointment Apply 1 application topically 2 (two) times daily as needed  (eczema flares).  05/09/19  Yes [provider]  vitamin C (ASCORBIC ACID) 500 MG tablet Take 500 mg by mouth daily.   Yes [provider]  ibuprofen (ADVIL,MOTRIN) 800 MG tablet Take 1 tablet (800 mg total) by mouth every 8 (eight) hours as needed for moderate pain. Patient not taking: Reported on 07/15/2019 01/24/18   Gilda CreasePollina, Christopher J, MD  methocarbamol (ROBAXIN) 500 MG tablet Take 1 tablet (500 mg total) by mouth at bedtime as needed for muscle spasms. 07/15/19   Salina Stanfield, PA-C  pantoprazole (PROTONIX) 20 MG tablet Take 1 tablet (20 mg total) by mouth daily. 07/15/19   Aws Shere, PA-C  potassium chloride SA (K-DUR,KLOR-CON) 20 MEQ tablet Take 2 tablets (40 mEq total) by mouth 2 (two) times daily. Patient not taking: Reported on 01/24/2018 05/23/17 01/25/24  Maxie Betterousins, Sheronette, MD    Family History Family History  Problem Relation Age of Onset   Heart disease Mother    Heart disease Father    Breast cancer Maternal Aunt     Social History Social History   Tobacco Use   Smoking status: Passive Smoke Exposure - Never Smoker   Smokeless tobacco: Never Used   Tobacco comment: reports smokes socially x1 month  Substance Use Topics   Alcohol use: Yes    Alcohol/week: 2.0 standard drinks    Types: 2 Glasses of wine per week    Comment: Occassional   Drug use: No     Allergies   Patient has no known allergies.   Review of Systems Review of Systems  Constitutional:       Intermittent sweats  Cardiovascular: Positive for chest pain.  Gastrointestinal: Positive for nausea.  Musculoskeletal: Positive for back pain and neck pain.  Neurological: Positive for headaches.       Intermittent tingling of L arm  All other systems reviewed and are negative.    Physical Exam Updated Vital Signs BP 132/89 (BP Location: Right Arm)    Pulse 76    Temp 98.7 F (37.1 C) (Oral)    Resp 13    LMP 06/21/2019 (Approximate)    SpO2 99%   Physical  Exam Vitals signs and nursing note reviewed.  Constitutional:      General: She is not in acute distress.    Appearance: She is well-developed.     Comments: Sitting in the bed in NAD  HENT:     Head: Normocephalic and atraumatic.  Eyes:     Extraocular Movements: Extraocular movements intact.     Conjunctiva/sclera: Conjunctivae normal.     Pupils: Pupils are equal, round, and reactive to light.  Neck:  Musculoskeletal: Normal range of motion and neck supple.  Cardiovascular:     Rate and Rhythm: Normal rate and regular rhythm.     Pulses: Normal pulses.  Pulmonary:     Effort: Pulmonary effort is normal. No respiratory distress.     Breath sounds: Normal breath sounds. No wheezing.     Comments: Speaking in full sentences. Clear lung sounds in all fields.  Abdominal:     General: There is no distension.     Palpations: Abdomen is soft. There is no mass.     Tenderness: There is no abdominal tenderness. There is no guarding or rebound.  Musculoskeletal: Normal range of motion.        General: Tenderness present.     Comments: ttp of L upper back, L side neck. No deformity.  Radial pulses intact. Sensation of upper ext intact. 5/5 strength bilaterally, however slightly weaker on L side (?due to effort).  strength and sensation of lower ext intact bilaterally.   Skin:    General: Skin is warm and dry.     Capillary Refill: Capillary refill takes less than 2 seconds.  Neurological:     Mental Status: She is alert and oriented to person, place, and time.     GCS: GCS eye subscore is 4. GCS verbal subscore is 5. GCS motor subscore is 6.     Cranial Nerves: Cranial nerves are intact.     Sensory: Sensation is intact.     Coordination: Coordination is intact.     Comments: Nose to finger intact. Cn intact      ED Treatments / Results  Labs (all labs ordered are listed, but only abnormal results are displayed) Labs Reviewed  BASIC METABOLIC PANEL - Abnormal; Notable for  the following components:      Result Value   Chloride 97 (*)    Glucose, Bld 115 (*)    All other components within normal limits  CBC - Abnormal; Notable for the following components:   WBC 12.8 (*)    All other components within normal limits  URINALYSIS, ROUTINE W REFLEX MICROSCOPIC - Abnormal; Notable for the following components:   Color, Urine STRAW (*)    Ketones, ur 5 (*)    All other components within normal limits  TSH  MAGNESIUM  I-STAT BETA HCG BLOOD, ED ([facility], [facility], [facility] ONLY)  TROPONIN I (HIGH SENSITIVITY)  TROPONIN I (HIGH SENSITIVITY)    EKG EKG Interpretation  Date/Time:  Friday July 15 2019 15:03:07 EDT Ventricular Rate:  91 PR Interval:  144 QRS Duration: 82 QT Interval:  330 QTC Calculation: 405 R Axis:   82 Text Interpretation:  Normal sinus rhythm Nonspecific T wave abnormality No significant change since last tracing Confirmed by Cathren LaineSteinl, Kevin ([id number]) on 07/15/2019 4:56:01 PM   Radiology Dg Chest 2 View  Result Date: 07/15/2019 CLINICAL DATA:  Chest pain.  History of a pulmonary nodule. EXAM: CHEST - 2 VIEW COMPARISON:  January 23, 2018 FINDINGS: The heart size and mediastinal contours are within normal limits. Both lungs are clear. The visualized skeletal structures are unremarkable. IMPRESSION: No active cardiopulmonary disease. Electronically Signed   By: Gerome Samavid  Williams III M.D   On: 07/15/2019 15:39   Ct Head Wo Contrast  Result Date: 07/15/2019 CLINICAL DATA:  Left-sided headache. EXAM: CT HEAD WITHOUT CONTRAST TECHNIQUE: Contiguous axial images were obtained from the base of the skull through the vertex without intravenous contrast. COMPARISON:  01/23/2018 FINDINGS: Brain: There is no evidence of acute  infarct, intracranial hemorrhage, mass, midline shift, or extra-axial fluid collection. The ventricles and sulci are normal. Vascular: No hyperdense vessel. Skull: No fracture or focal osseous lesion. Sinuses/Orbits: Paranasal sinuses and mastoid air cells  are clear. Unremarkable orbits. Other: None. IMPRESSION: Negative head CT. Electronically Signed   By: Logan Bores M.D.   On: 07/15/2019 11:16   Mr Brain Wo Contrast (neuro Protocol)  Result Date: 07/15/2019 CLINICAL DATA:  48 year old female with left side headache and chest pain. Lightheadedness, left arm tingling. EXAM: MRI HEAD WITHOUT CONTRAST MRI CERVICAL SPINE WITHOUT CONTRAST TECHNIQUE: Multiplanar, multiecho pulse sequences of the brain and surrounding structures, and cervical spine, to include the craniocervical junction and cervicothoracic junction, were obtained without intravenous contrast. COMPARISON:  Head CT earlier today. Cervical spine radiographs [facility] 07/05/2019. FINDINGS: MRI HEAD FINDINGS Brain: No restricted diffusion to suggest acute infarction. No midline shift, mass effect, evidence of mass lesion, ventriculomegaly, extra-axial collection or acute intracranial hemorrhage. Cervicomedullary junction and pituitary are within normal limits. Pearline Cables and white matter signal is within normal limits for age throughout the brain. No chronic cerebral blood products or encephalomalacia identified. Vascular: Major intracranial vascular flow voids are preserved, the left vertebral artery appears dominant. Skull and upper cervical spine: Cervical spine reported below. Visualized bone marrow signal is within normal limits. Sinuses/Orbits: Negative orbits. Trace paranasal sinus mucosal thickening. Other: Mastoids are clear. Grossly normal visible internal auditory structures. Scalp and face soft tissues appear negative. MRI CERVICAL SPINE FINDINGS Alignment: Straightening of cervical lordosis similar to the recent radiographs. Vertebrae: No marrow edema or evidence of acute osseous abnormality. Low normal T1 marrow signal throughout the visible spine. Cord: Spinal cord signal is within normal limits at all visualized levels. Posterior Fossa, vertebral arteries, paraspinal tissues:  Cervicomedullary junction is within normal limits. Major vascular flow voids in the neck are preserved and the left vertebral artery appears dominant. Negative visible neck soft tissues and lung apices. Disc levels: C2-C3:  Negative. C3-C4:  Mild disc bulge and facet hypertrophy.  No stenosis. C4-C5:  Mild disc bulge and facet hypertrophy.  No stenosis. C5-C6: Mild disc bulge and left facet hypertrophy. Small bilateral nerve root diverticula (generally a normal variant). No stenosis. C6-C7: Small left paracentral disc protrusion best seen on series 15, image 20. Narrowing of the ventral CSF space but no associated stenosis. Small bilateral nerve root diverticula. C7-T1:  Negative, small bilateral nerve root diverticula. Mild facet hypertrophy in the upper thoracic spine without stenosis. Small left T1 nerve root diverticulum. IMPRESSION: 1. Normal noncontrast MRI appearance of the brain. 2. Mild cervical spine disc and facet degeneration with no spinal stenosis or convincing neural impingement. The most notable small disc protrusion is eccentric to the left at C6-C7. Electronically Signed   By: Genevie Ann M.D.   On: 07/15/2019 19:36   Mr Cervical Spine Wo Contrast  Result Date: 07/15/2019 CLINICAL DATA:  48 year old female with left side headache and chest pain. Lightheadedness, left arm tingling. EXAM: MRI HEAD WITHOUT CONTRAST MRI CERVICAL SPINE WITHOUT CONTRAST TECHNIQUE: Multiplanar, multiecho pulse sequences of the brain and surrounding structures, and cervical spine, to include the craniocervical junction and cervicothoracic junction, were obtained without intravenous contrast. COMPARISON:  Head CT earlier today. Cervical spine radiographs [facility] 07/05/2019. FINDINGS: MRI HEAD FINDINGS Brain: No restricted diffusion to suggest acute infarction. No midline shift, mass effect, evidence of mass lesion, ventriculomegaly, extra-axial collection or acute intracranial hemorrhage. Cervicomedullary junction  and pituitary are within normal limits. Pearline Cables and white matter signal  is within normal limits for age throughout the brain. No chronic cerebral blood products or encephalomalacia identified. Vascular: Major intracranial vascular flow voids are preserved, the left vertebral artery appears dominant. Skull and upper cervical spine: Cervical spine reported below. Visualized bone marrow signal is within normal limits. Sinuses/Orbits: Negative orbits. Trace paranasal sinus mucosal thickening. Other: Mastoids are clear. Grossly normal visible internal auditory structures. Scalp and face soft tissues appear negative. MRI CERVICAL SPINE FINDINGS Alignment: Straightening of cervical lordosis similar to the recent radiographs. Vertebrae: No marrow edema or evidence of acute osseous abnormality. Low normal T1 marrow signal throughout the visible spine. Cord: Spinal cord signal is within normal limits at all visualized levels. Posterior Fossa, vertebral arteries, paraspinal tissues: Cervicomedullary junction is within normal limits. Major vascular flow voids in the neck are preserved and the left vertebral artery appears dominant. Negative visible neck soft tissues and lung apices. Disc levels: C2-C3:  Negative. C3-C4:  Mild disc bulge and facet hypertrophy.  No stenosis. C4-C5:  Mild disc bulge and facet hypertrophy.  No stenosis. C5-C6: Mild disc bulge and left facet hypertrophy. Small bilateral nerve root diverticula (generally a normal variant). No stenosis. C6-C7: Small left paracentral disc protrusion best seen on series 15, image 20. Narrowing of the ventral CSF space but no associated stenosis. Small bilateral nerve root diverticula. C7-T1:  Negative, small bilateral nerve root diverticula. Mild facet hypertrophy in the upper thoracic spine without stenosis. Small left T1 nerve root diverticulum. IMPRESSION: 1. Normal noncontrast MRI appearance of the brain. 2. Mild cervical spine disc and facet degeneration with no  spinal stenosis or convincing neural impingement. The most notable small disc protrusion is eccentric to the left at C6-C7. Electronically Signed   By: Odessa Fleming M.D.   On: 07/15/2019 19:36    Procedures Procedures (including critical care time)  Medications Ordered in ED Medications  sodium chloride flush (NS) 0.9 % injection 3 mL (3 mLs Intravenous Not Given 07/15/19 2032)     Initial Impression / Assessment and Plan / ED Course  I have reviewed the triage vital signs and the nursing notes.  Pertinent labs & imaging results that were available during my care of the patient were reviewed by me and considered in my medical decision making (see chart for details).        Pt presenting for evaluation of multiple intermittent sxs. Overall physical exam is reassuring, she appears nontoxic. Neuro exam with slight weakness of L upper extremity, although this may be due to effort. ttp of upper back and neck on L side. Consider radiculopathy. also consider extrapulmonary sarcoid lesion, although low suspicion at this time. Will order mri head (with HA and neuro sxs) and cspine for further evaluation. Nausea likely 2/2 reflux, as pt has had increased heartburn sxs. As pt is having CP, will obtain labs, ekg, and cxr. tsh ordered due to constellation of sxs and pt's report of sweating.   Labs overall reassuring. nonsepcific leukocytosis. electrolytes stable. Kidney fnx stable. TSH normal, consider early menopause as reason for sweats. Trop negative x2. EKG without stemi. CXR viewed and interpreted by me, no pna, pnx, effusion, cardiomegaly. MRI head and neck negative. Urine without infection. discussed findings with pt. discussed tx for heartburn for nausea (?imrpovement in CP). discussed muscle relaxer for likely MSK neck and back pain. F/u with PCP for further eval. At this time, pt appears safe for d/c. return precautions given. Pt states she understands and agrees to plan.   Final Clinical  Impressions(s) / ED Diagnoses   Final diagnoses:  Nonintractable headache, unspecified chronicity pattern, unspecified headache type  Paresthesias  Nausea  Atypical chest pain    ED Discharge Orders         Ordered    pantoprazole (PROTONIX) 20 MG tablet  Daily     07/15/19 2040    methocarbamol (ROBAXIN) 500 MG tablet  At bedtime PRN     07/15/19 2040           Edwar Coe, PA-C 07/15/19 2358    Cathren Laine, MD 07/16/19 1454

## 2019-07-15 NOTE — ED Triage Notes (Signed)
Patient reports L-sided chest pain radiating from underneath L shoulder blade around chest wall under L breast - intermittent since July. Patient saw PCP for same and was given steroid pack - currently on 10th day for it with no improvement. States this episode began this morning upon waking. Also has intermittent lightheadedness, L arm tingling sensation, and nausea with pain episodes. Resp e/u, skin w/d.

## 2019-07-27 ENCOUNTER — Other Ambulatory Visit (HOSPITAL_COMMUNITY): Payer: Self-pay | Admitting: *Deleted

## 2019-07-27 DIAGNOSIS — N6320 Unspecified lump in the left breast, unspecified quadrant: Secondary | ICD-10-CM

## 2019-08-02 ENCOUNTER — Ambulatory Visit (HOSPITAL_COMMUNITY): Payer: BLUE CROSS/BLUE SHIELD

## 2019-08-04 ENCOUNTER — Other Ambulatory Visit: Payer: Self-pay

## 2019-08-23 ENCOUNTER — Ambulatory Visit
Admission: RE | Admit: 2019-08-23 | Discharge: 2019-08-23 | Disposition: A | Discharge status: Home / Self Care | Payer: No Typology Code available for payment source | Source: Ambulatory Visit | Attending: Obstetrics and Gynecology | Admitting: Obstetrics and Gynecology

## 2019-08-23 ENCOUNTER — Encounter (HOSPITAL_COMMUNITY): Payer: Self-pay

## 2019-08-23 ENCOUNTER — Other Ambulatory Visit: Payer: Self-pay

## 2019-08-23 ENCOUNTER — Ambulatory Visit (HOSPITAL_COMMUNITY)
Admission: RE | Admit: 2019-08-23 | Discharge: 2019-08-23 | Disposition: A | Discharge status: Home / Self Care | Payer: Self-pay | Source: Ambulatory Visit | Attending: Obstetrics and Gynecology | Admitting: Obstetrics and Gynecology

## 2019-08-23 DIAGNOSIS — Z1239 Encounter for other screening for malignant neoplasm of breast: Secondary | ICD-10-CM | POA: Insufficient documentation

## 2019-08-23 DIAGNOSIS — N6320 Unspecified lump in the left breast, unspecified quadrant: Secondary | ICD-10-CM

## 2019-08-23 DIAGNOSIS — N644 Mastodynia: Secondary | ICD-10-CM

## 2019-08-23 NOTE — Patient Instructions (Signed)
Explained breast self awareness with Joneen Roach. Patient did not need a Pap smear today due to last Pap smear was in January 2018 per patient. Let her know [facility] will cover Pap smears every 3 years unless has a history of abnormal Pap smears. Referred patient to the [facility] for a diagnostic mammogram. Appointment scheduled for Tuesday, August 23, 2019 at 1250. Patient aware of appointment and will be there. Joneen Roach verbalized understanding.  Pinkie Manger, Arvil Chaco, RN 12:06 PM

## 2019-08-23 NOTE — Progress Notes (Signed)
Complaints of left breast lump.  Pap Smear: Pap smear not completed today. Last Pap smear was in January 2018 by Dr. Garwin Brothers and normal per patient. Per patient has a history of an abnormal Pap smear 5 years ago that a repeat Pap smear was completed for follow-up. Per patient has had three normal Pap smears since abnormal. No Pap smear results are in Epic.  Physical exam: Breasts Breasts symmetrical. No skin abnormalities bilateral breasts. No nipple retraction bilateral breasts. No nipple discharge bilateral breasts. No lymphadenopathy. No lumps palpated bilateral breasts. Unable to palpate a lump in patients area of concern. Complaints of left outer breat tenderness on exam. Referred patient to the [facility] for a diagnostic mammogram. Appointment scheduled for Tuesday, August 23, 2019 at 1250.        Pelvic/Bimanual No Pap smear completed today since last Pap smear was in January 2018 per patient. Pap smear not indicated per [facility] guidelines.   Smoking History: Patient has never smoked.  Patient Navigation: Patient education provided. Access to services provided for patient through [facility] program.   Breast and Cervical Cancer Risk Assessment: Patient has a family history of a maternal aunt having breast cancer. Patient has no known genetic mutations or history of radiation treatment to the chest before age 33. Patient has no history of cervical dysplasia, immunocompromised, or DES exposure in-utero.  Risk Assessment    Risk Scores      08/23/2019   Last edited by: Loletta Parish, RN   5-year risk: 0.9 %   Lifetime risk: 9.2 %

## 2019-10-19 IMAGING — MG MM DIGITAL DIAGNOSTIC BILAT W/ TOMO W/ CAD
6 of 10 series · 6 of 30 positions shown · non-contrast
Comparison: Previous exam(s).

CLINICAL DATA: Follow-up for probably benign mass in the LEFT
breast. The probably benign mass was originally identified on
ultrasound dated 10/20/2016.

EXAM:
DIGITAL DIAGNOSTIC BILATERAL MAMMOGRAM WITH CAD AND TOMO
ULTRASOUND LEFT BREAST

[L MLO synth-2D (1 of 2)]
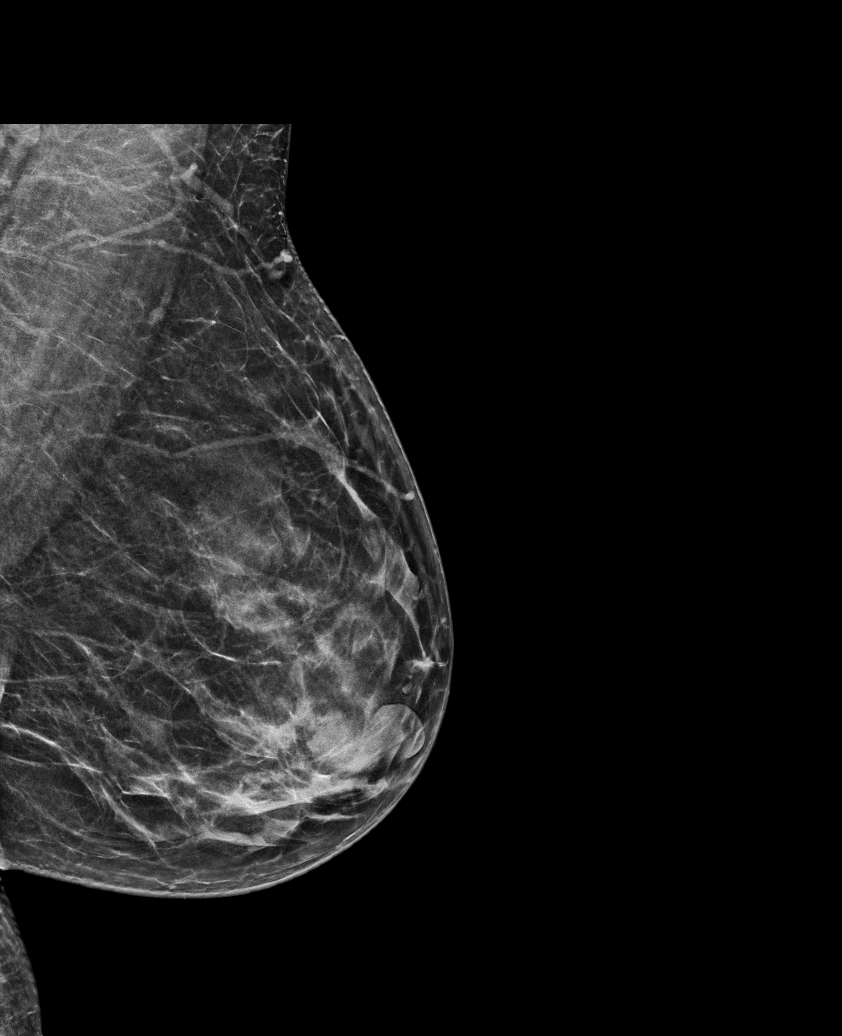

[R MLO synth-2D]
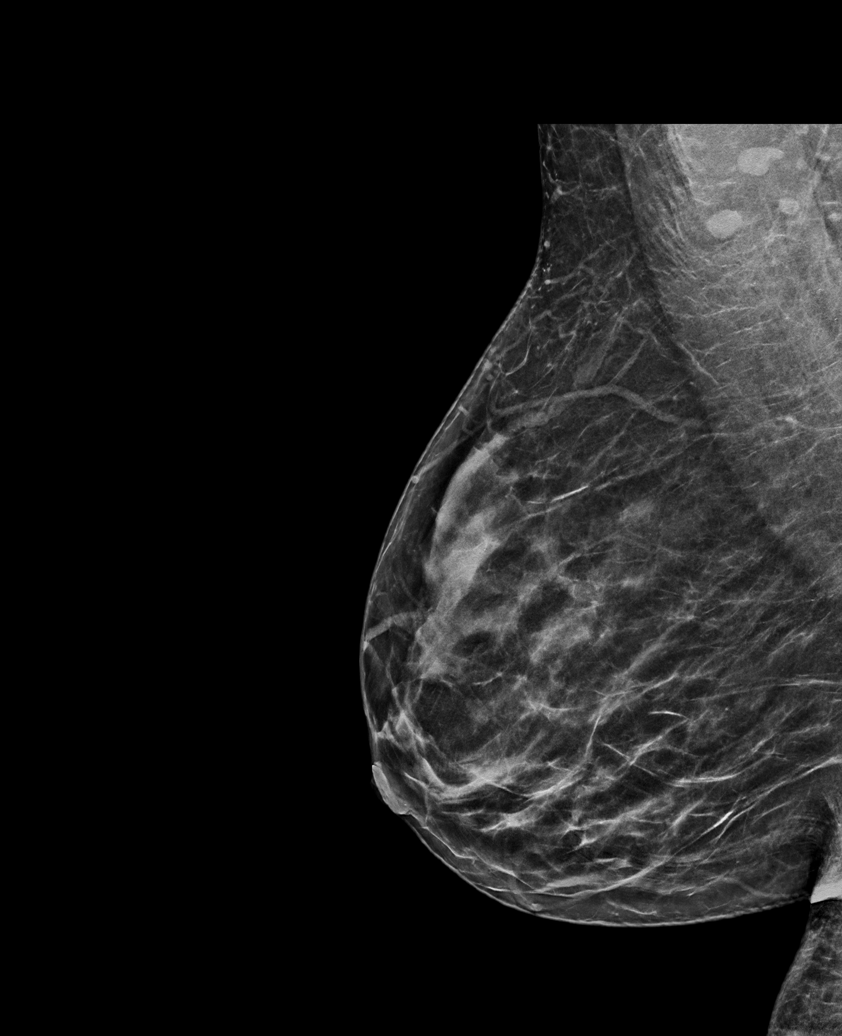

[L MLO synth-2D (2 of 2)]
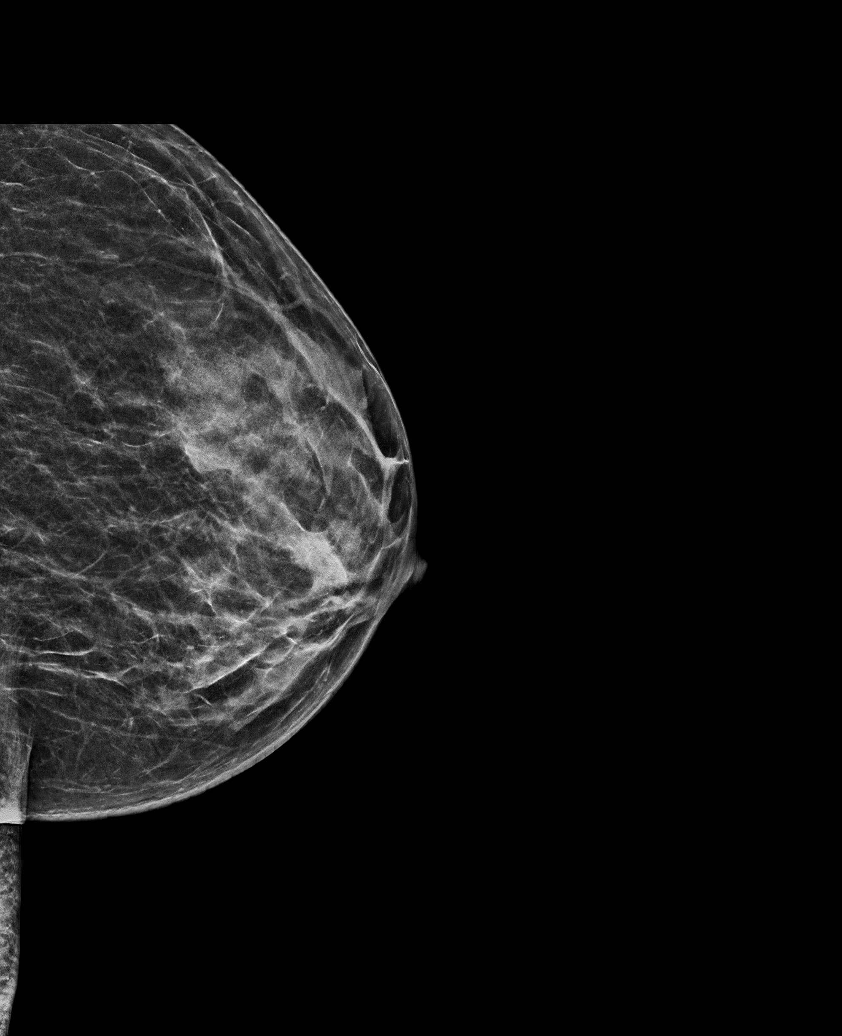

[R CC synth-2D]
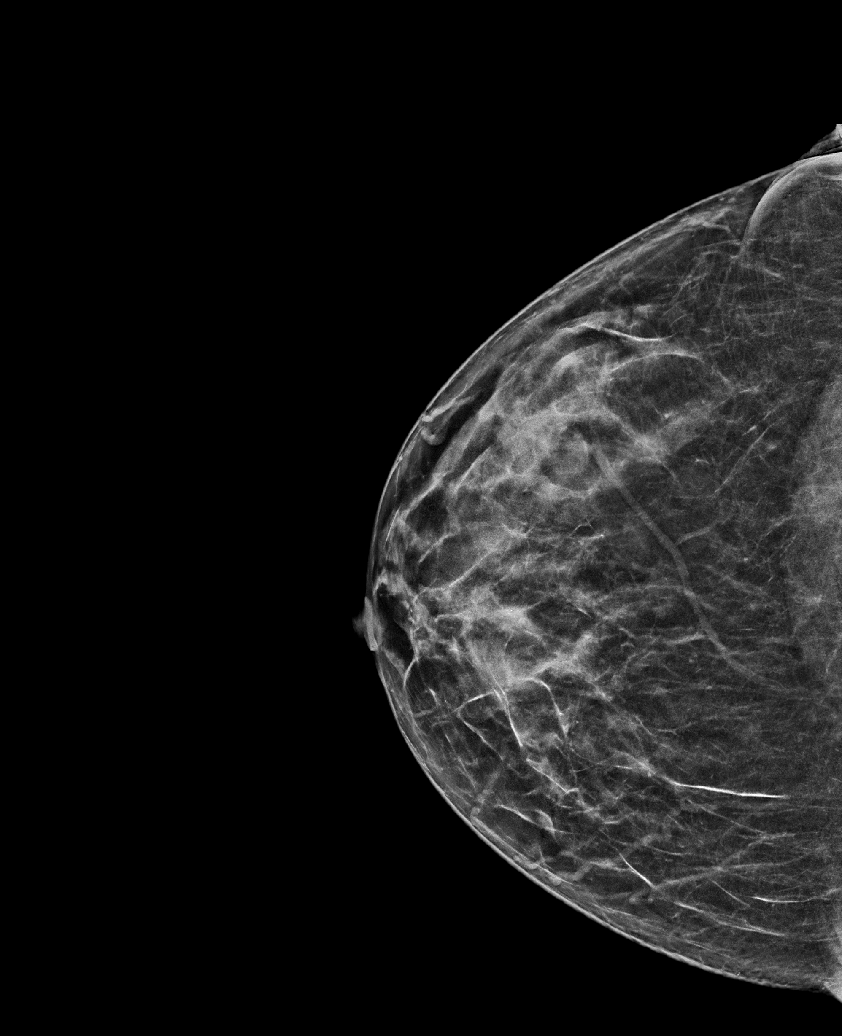

[L CC synth-2D]
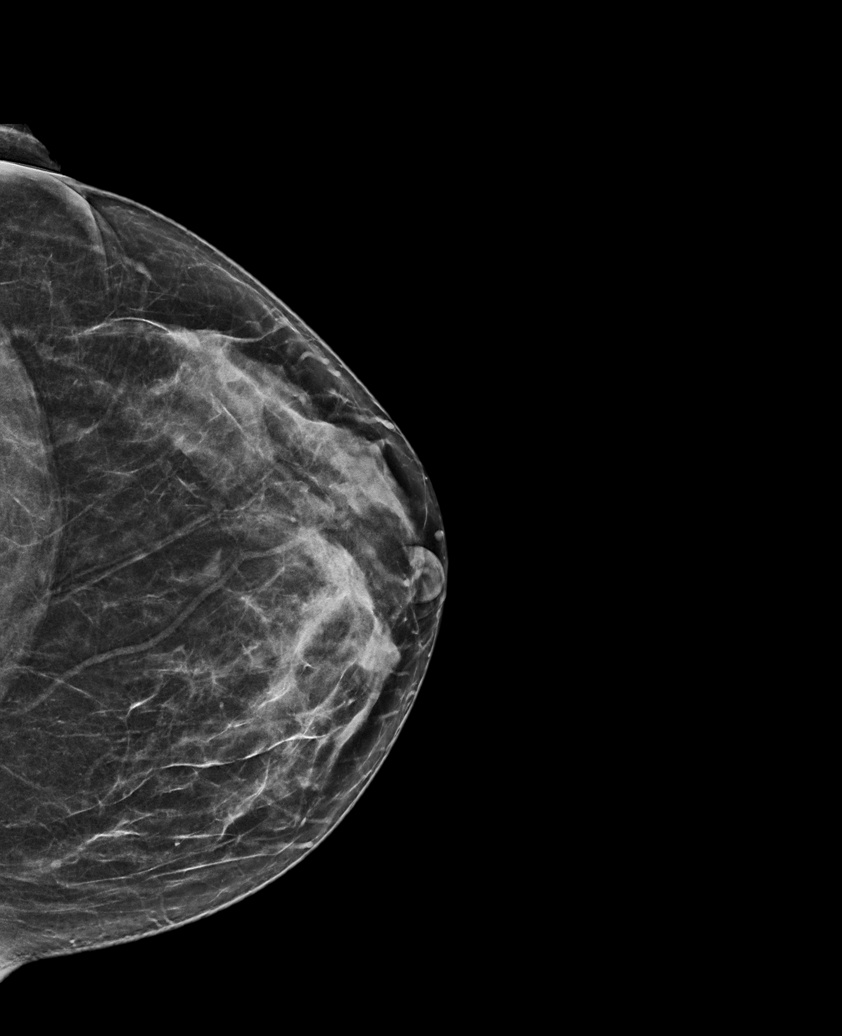

[L CC tomo · tomo slice 31/62.0]
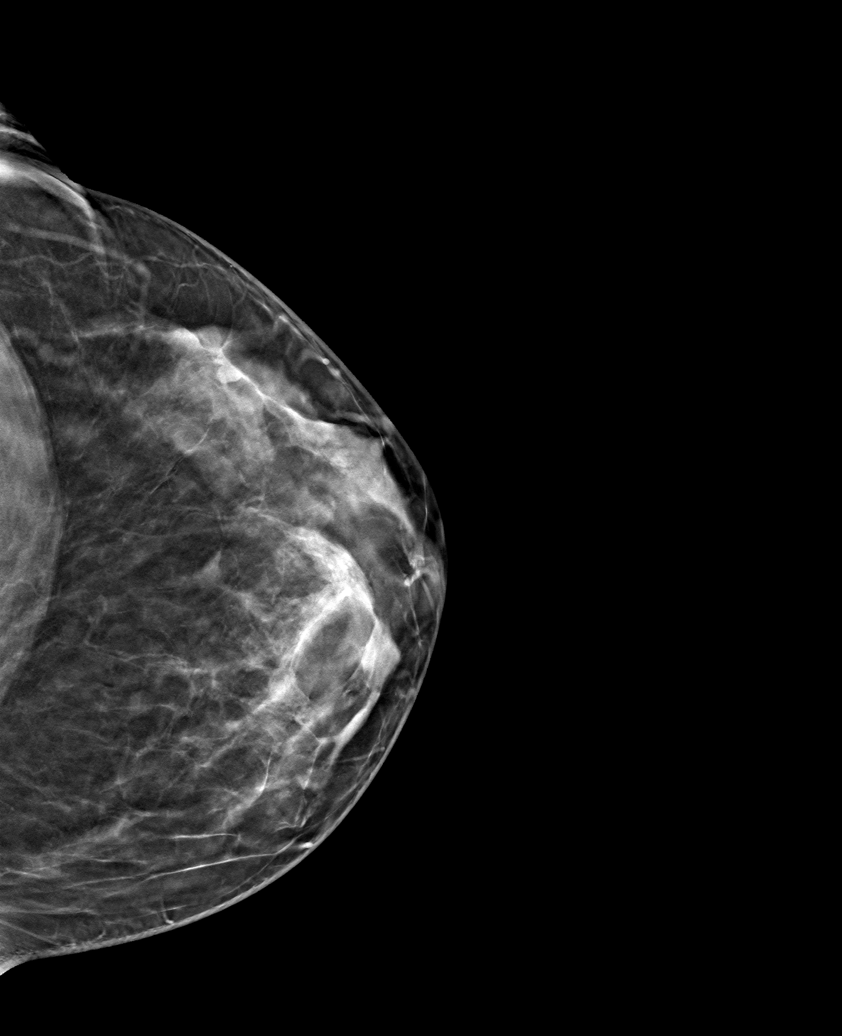

[6 of 30 positions shown; findings below may reference images not displayed]

ACR Breast Density Category c: The breast tissue is heterogeneously
dense, which may obscure small masses.
FINDINGS: There are no dominant masses, suspicious calcifications or secondary
signs of malignancy within either breast.

Mammographic images were processed with CAD.

Targeted ultrasound is performed, again showing an oval
circumscribed hypoechoic mass in the LEFT breast at the 2 o'clock
axis, 5 cm from the nipple, measuring 6 mm greatest dimension, not
significantly changed for more than 2 years indicating benignity.
IMPRESSION: No evidence of malignancy within either breast. Benign mass within
the LEFT breast at the 2 o'clock axis, stable for greater than 2
years confirming benignity, presumed benign fibroadenoma.

Patient may return to routine annual bilateral screening mammogram
schedule.

RECOMMENDATION:
Screening mammogram in one year.(Code:S9-Y-NEE)

I have discussed the findings and recommendations with the patient.
If applicable, a reminder letter will be sent to the patient
regarding the next appointment.

BI-RADS CATEGORY  2: Benign.

## 2020-01-30 ENCOUNTER — Encounter: Payer: Self-pay | Admitting: Internal Medicine

## 2020-01-30 ENCOUNTER — Ambulatory Visit (INDEPENDENT_AMBULATORY_CARE_PROVIDER_SITE_OTHER): Payer: 59

## 2020-01-30 ENCOUNTER — Other Ambulatory Visit: Payer: Self-pay

## 2020-01-30 ENCOUNTER — Telehealth: Payer: Self-pay | Admitting: Internal Medicine

## 2020-01-30 ENCOUNTER — Ambulatory Visit (INDEPENDENT_AMBULATORY_CARE_PROVIDER_SITE_OTHER): Payer: 59 | Admitting: Internal Medicine

## 2020-01-30 VITALS — BP 134/80 | HR 71 | Ht 59.0 in | Wt 179.4 lb

## 2020-01-30 DIAGNOSIS — R0789 Other chest pain: Secondary | ICD-10-CM

## 2020-01-30 DIAGNOSIS — Z862 Personal history of diseases of the blood and blood-forming organs and certain disorders involving the immune mechanism: Secondary | ICD-10-CM | POA: Diagnosis not present

## 2020-01-30 DIAGNOSIS — D860 Sarcoidosis of lung: Secondary | ICD-10-CM

## 2020-01-30 NOTE — Progress Notes (Signed)
OV 12/14/2014  Chief Complaint  Patient presents with  . Pulmonary Consult    Recent pt of RA. Pt switched physicians to MR for sarcoid.     49 year old AA female  Originally dmitted December 14.-11/05/2012 for multiple pulmonary nodules and shortness of breath. On initial presentation had rt neck pain &  low-grade fever w/ SIRS. She was treated empirically for possible pneumonia. CT chest showed multiple bilateral lung masses and nodules with mild hilar and mediastinal lymphadenopathy . Patient underwent a CT guided lung biopsy that showed non-necrotizing granulomas with no evidence of malignancy , ACE was low , CT angio neck neg for thrombophlebitis of neck veins .  AFP and HCG low.  ANA, ANCA neg.  2-D echo showed normal left ventricular function  She was discharged on prednisone 20 mg daily . Seen on 11/09/12 >> pred decreased 10 mg. Last seen in pulmonary office Feb 2014: and at that time had increased nodule size. But PFTs - FVC 96%, DLCO 68% corrects to 100% for [facility]. At that time unhappy with services in pulmonary office and left for folllowup. Had fu CXR Oct 2014 and Jan 2016 with PCP that shows complete resolution of the nodules. She has been off prednisone for a long long time ? Few years.   Now coming back to pulmonary clinic due to new onset, insidious onset  Of left sided atypical chest pain in back in inter- and infra-scapular region that is only somewhat pleuritic. Not associated with dyspnea, fever, chills, B symptoms, cough. It is persistent and moderate in intensity. CXR Jan 2016 is clear    11/23/2015-49 year old female followed for sarcoid, multiple lung nodules( needle biopsy 2013) Acute visit-MR pt. Pt c/o cough with green/yellow thick mucus, wheeze and SOB since Monday. Pt does not have an albuterol HFA.  Treated fever with ibuprofen. Sputum has been thick yellow green. Now frontal headache without sore throat. 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 CT chest  12/21/2014 IMPRESSION: 1. Resolution of the large pulmonary nodules in the upper lobes seen on remote comparison exam. 2. No typical findings of sarcoidosis within the lungs. 3. Peripheral bronchovascular linear thickening presumably relates to sarcoidosis. 4. No mediastinal lymphadenopathy. Electronically Signed  By: [name] M.D.  On: 12/22/2014 07:31    OV 12/26/2016  Chief Complaint  Patient presents with  . Follow-up    Pt states she is becoming more SOB. Pt c/o nasal drainage and left eye issues, axillary lymph node swelling - pt concerned it may be related to her sarcoid. Pt denies f/c/s.     49 year old Af. American female with pulmonary sarcoidosis. Last seen by myself personally 2 years ago at which time she had resolution of her pulmonary nodules and mediastinal adenopathy. Subsequently she had one episode of acute bronchitis and saw Dr. Maple Hudson is one year ago. She tells me now for the last 4-6 months she's had increased fatigue, shortness of breath it's nonspecific, generalized arthralgia and also red eyes. She extremely worried about her health. She says she is a widow but just to be on the safe side she had her primary care physician rule out HIV and other sexually transmitted diseases some 4 months ago and all of this was normal. She also had ACE level checked by her primary care physician and apparently this was normal too. She is not understanding why she's had some decline but all these tests are normal. There is no wheezing. There is some mild occasional cough.  Overall symptoms are rated as moderate there is also associated fatigue  Exhaled nitric oxide today 01/05/2017: 19 ppb and normal   has a past medical history of Hypertension; Ovarian cyst; Pulmonary nodule; Sarcoid (HCC); SVT (supraventricular tachycardia) (HCC); and Tubal pregnancy.   reports that she is a non-smoker but has been exposed to tobacco smoke. She has never used smokeless tobacco.  OV  01/30/2020  Subjective:  Patient ID: Lauren Russo, female , DOB: 1971-06-11 , age 50 y.o. , MRN: [id number] , ADDRESS: [address] [place] [address]   01/30/2020 -   Chief Complaint  Patient presents with  . Consult    Pt last seen by MR 12/26/16. Pt states she will occ develop a pain at the top of her back and will radiate to the middle of her back.   New consult because it has been 3 years since I last saw her.  HPI Lauren Russo 49 y.o. -has a personal history of previous sarcoidosis but 2016 CT scan of the chest showed clear lung fields.  As last saw him in 2018 as documented below.  At that time sent her to ophthalmologist.  She tells me now the ophthalmologist advised her that she does not have any sarcoidosis of I.  Instead she has dry eyes and is on tacrolimus.  She sees the ophthalmologist regularly.  In the last year or so she has developed a rash in her forearms.  She sees [facility] dermatology Dr. Doreen Beam.  Apparently it is not sarcoid.  Although she is not responded to treatment that is topical.  Now she tells me for the last several months or so and less than a year she has on and off upper back lower chest pain that goes transverse.  She had an MRI in August 2020 that I reviewed the result.  There is only mild herniation and disc without any stenosis.  Primary care has advised her that another reason for her ongoing symptoms.  She is not short of breath other than when she talks a lot through the mask.  No cough no dyspnea on exertion.  No wheezing no orthopnea no proximal nocturnal dyspnea no edema.  No hemoptysis no fever no chills.  No urinary issues.  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  Therefore she is self-pay.  She wants minimal work-up.  She is agreeable to a chest x-ray and clinical follow-up at this stage.   Results for Lauren, Russo (MRN [id number]) as of 01/30/2020 10:25  Ref. Range  12/26/2016 17:07  Anti Nuclear Antibody (ANA) Latest Ref Range: NEGATIVE  NEG  Cyclic Citrullin Peptide Ab Latest Units: Units <16  ds DNA Ab Latest Units: IU/mL <1  RA Latex Turbid. Latest Ref Range: <14 IU/mL <14  SSA (Ro) (ENA) Antibody, IgG Latest Ref Range: <1.0 NEG AI  <1.0 NEG  SSB (La) (ENA) Antibody, IgG Latest Ref Range: <1.0 NEG AI  <1.0 NEG  Scleroderma (Scl-70) (ENA) Antibody, IgG Latest Ref Range: <1.0 NEG AI  <1.0 NEG    ROS - per HPI     has a past medical history of Hypertension, Ovarian cyst, Pulmonary nodule, Sarcoid, SVT (supraventricular tachycardia) (HCC), and Tubal pregnancy.   reports that she is a non-smoker but has been exposed to tobacco smoke. She has never used smokeless tobacco.  Past Surgical History:  Procedure Laterality Date  . LAPAROSCOPIC SALPINGOOPHERECTOMY     unsure of  which side.   . TUBAL LIGATION      No Known Allergies   There is no immunization history on file for this patient.  Family History  Problem Relation Age of Onset  . Heart disease Mother   . Heart disease Father   . Breast cancer Maternal Aunt      Current Outpatient Medications:  .  acetaminophen (TYLENOL) 500 MG tablet, Take 500 mg by mouth every 6 (six) hours as needed for headache (pain)., Disp: , Rfl:  .  augmented betamethasone dipropionate (DIPROLENE-AF) 0.05 % ointment, Apply 1 application topically daily as needed (eczema flares). , Disp: , Rfl:  .  hydrochlorothiazide (HYDRODIURIL) 12.5 MG tablet, Take 12.5 mg by mouth every morning., Disp: , Rfl:  .  ibuprofen (ADVIL,MOTRIN) 800 MG tablet, Take 1 tablet (800 mg total) by mouth every 8 (eight) hours as needed for moderate pain., Disp: 21 tablet, Rfl: 0 .  losartan (COZAAR) 100 MG tablet, Take 50 mg by mouth every morning., Disp: , Rfl:  .  Probiotic Product (PROBIOTIC PO), Take 1 tablet by mouth daily., Disp: , Rfl:  .  tacrolimus (PROTOPIC) 0.1 % ointment, Apply 1 application topically 2 (two) times daily as  needed (eczema flares). , Disp: , Rfl:  .  vitamin C (ASCORBIC ACID) 500 MG tablet, Take 500 mg by mouth daily., Disp: , Rfl:  .  methocarbamol (ROBAXIN) 500 MG tablet, Take 1 tablet (500 mg total) by mouth at bedtime as needed for muscle spasms. (Patient not taking: Reported on 01/30/2020), Disp: 10 tablet, Rfl: 0 .  pantoprazole (PROTONIX) 20 MG tablet, Take 1 tablet (20 mg total) by mouth daily. (Patient not taking: Reported on 01/30/2020), Disp: 14 tablet, Rfl: 0 .  potassium chloride SA (K-DUR,KLOR-CON) 20 MEQ tablet, Take 2 tablets (40 mEq total) by mouth 2 (two) times daily. (Patient not taking: Reported on 01/24/2018), Disp: 12 tablet, Rfl: 0      Objective:   Vitals:   01/30/20 1001  BP: 134/80  Pulse: 71  SpO2: 100%  Weight: 179 lb 6.4 oz (81.4 kg)  Height: 4\' 11"  (1.499 m)    Estimated body mass index is 36.23 kg/m as calculated from the following:   Height as of this encounter: 4\' 11"  (1.499 m).   Weight as of this encounter: 179 lb 6.4 oz (81.4 kg).  @WEIGHTCHANGE @  [employer]   01/30/20 1001  Weight: 179 lb 6.4 oz (81.4 kg)     Physical Exam Alert and oriented x3.  Speech is normal clear to auscultation bilaterally.  Abdomen soft.  No cyanosis no clubbing no edema.     Assessment:       ICD-10-CM   1. Other chest pain  R07.89 DG Chest 2 View  2. Personal history of sarcoidosis  Z86.2        Plan:     Patient Instructions     ICD-10-CM   1. Other chest pain  R07.89 DG Chest 2 View  2. Personal history of sarcoidosis  Z86.2     Pain upper back/lower chest sounds musculoskeletal  Plan  - cxr 2 view  - due to cost etc., hold off pft and ct  Followup  =- will call with results  - clinical followup as strategy; return in 6 months or sooner if needed     SIGNATURE    Dr. Brand Males, M.D., F.C.C.P,  Pulmonary and Critical Care Medicine Staff Physician, [facility] Director - Interstitial Lung Disease  Program  Pulmonary  [facility] [place], [place], [address]  Pager: [phone], If no answer or between  15:00h - 7:00h: call [phone] Telephone: [phone]  10:51 AM 01/30/2020

## 2020-01-30 NOTE — Patient Instructions (Addendum)
ICD-10-CM   1. Other chest pain  R07.89 DG Chest 2 View  2. Personal history of sarcoidosis  Z86.2     Pain upper back/lower chest sounds musculoskeletal  Plan  - cxr 2 view  - due to cost etc., hold off pft and ct  Followup  =- will call with results  - clinical followup as strategy; return in 6 months or sooner if needed

## 2020-01-30 NOTE — Telephone Encounter (Signed)
Call report on CXR 01/30/2019.  Impression copied below, full report available in Epic:  IMPRESSION: Subtle area of increased opacity in the right upper lobe. Of this area could represent scarring secondary to sarcoidosis, it must be noted that this finding was not present on prior chest radiograph and is concerning for a small focus of pneumonia currently. Lungs elsewhere clear. Cardiac silhouette normal. No adenopathy.

## 2020-02-01 NOTE — Telephone Encounter (Signed)
Called and spoke with Patient.  Dr. Jane Canary results and recommendations given.  Understanding stated. Cxr ordered.  Patient aware to come by office in 4 weeks for cxr.  Patient stated she has not been on any antibiotics for her current illness. Patient stated Prednisone was the only thing prescribed, and that has been completed for a while.  Patient stated she is still having the ache.  Message routed to Dr. Marchelle Gearing

## 2020-02-01 NOTE — Telephone Encounter (Signed)
Patient did not have $ for CT chest because she is paying out of pocket. LEt her know CXR is abnormal. There is a small area of scar or density in Right upper lung that was not present in august 2020  Plan  - ideally needs CT chest for eval -> but she could repeat a cxr in 4 weeks - question: has she has antibotics durig course of this illness - if so wwhen?    DG Chest 2 View  Result Date: 01/30/2020 CLINICAL DATA:  Chest pain.  Sarcoidosis. EXAM: CHEST - 2 VIEW COMPARISON:  July 15, 2019 FINDINGS: There is subtle increased opacity in the right upper lobe. Lungs elsewhere clear. Heart size and pulmonary vascularity are normal. No adenopathy. No bone lesions. IMPRESSION: Subtle area of increased opacity in the right upper lobe. Of this area could represent scarring secondary to sarcoidosis, it must be noted that this finding was not present on prior chest radiograph and is concerning for a small focus of pneumonia currently. Lungs elsewhere clear. Cardiac silhouette normal. No adenopathy. These results will be called to the ordering clinician or representative by the Radiologist Assistant, and communication documented in the PACS or [employer]. Electronically Signed   By: Bretta Bang III M.D.   On: 01/30/2020 12:31

## 2020-02-01 NOTE — Telephone Encounter (Signed)
Spoke with patient. Advised her of MR's recommendations. She stated that the doxy sounded familiar because she believes her dermatologist prescribed this for her eczema and she is already taking it. She has the medication at home and wants to be sure. She will call us back once she has been home to check the bottle.   Will leave this encounter open for follow up.

## 2020-02-01 NOTE — Telephone Encounter (Signed)
Let her try Take doxycycline 100mg  po twice daily x 5 days; take after meals and avoid sunlight  Change cxr to 2-3 weeks please  Looks non infiectious on cxr (looked again), really do not know what is going on. Ideally needs CT but can wait and get another cxr and then we can decide  No Known Allergies

## 2020-02-02 ENCOUNTER — Telehealth: Payer: Self-pay | Admitting: Internal Medicine

## 2020-02-02 MED ORDER — AMOXICILLIN 500 MG PO CAPS: 500.0000 mg | ORAL_CAPSULE | Freq: Three times a day (TID) | ORAL | 0 refills | Status: DC

## 2020-02-02 NOTE — Telephone Encounter (Signed)
Called and spoke to pt. Pt states the Doxycline was prescribed by her Dermatologist and she had to discontinue taking because of it causing N/V/D; this has been added to pt's allergy list. Pt states she has taken amoxicillin in the past and has tolerated it well. (Please see phone note from 01/30/2020 for more info)  Dr. Marchelle Gearing, please advise. Thanks.

## 2020-02-02 NOTE — Telephone Encounter (Signed)
Amox 500mg  tid x 5 days - watch for diarrhea, rash   Allergies  Allergen Reactions  . Doxycycline     Per pt causes N/V/D

## 2020-02-02 NOTE — Telephone Encounter (Signed)
Called and spoke with pt letting her know that MR said to send rx of amoxicillin to pharmacy for her in place of the doxy and pt verbalized understanding. Nothing further needed.

## 2020-02-07 NOTE — Telephone Encounter (Signed)
See phone note from 02/02/20. Will sign off.

## 2020-02-20 ENCOUNTER — Ambulatory Visit (INDEPENDENT_AMBULATORY_CARE_PROVIDER_SITE_OTHER): Payer: 59

## 2020-02-20 DIAGNOSIS — R0789 Other chest pain: Secondary | ICD-10-CM | POA: Diagnosis not present

## 2020-02-20 DIAGNOSIS — D860 Sarcoidosis of lung: Secondary | ICD-10-CM

## 2020-02-27 ENCOUNTER — Telehealth: Payer: Self-pay | Admitting: Internal Medicine

## 2020-02-27 DIAGNOSIS — D869 Sarcoidosis, unspecified: Secondary | ICD-10-CM

## 2020-02-27 NOTE — Telephone Encounter (Signed)
Pt had a cxr not a ct. Pt was notified of the cxr results and stated that she would need a CT to further evaluate what was seen on the cxr. Nothing further needed.

## 2020-02-27 NOTE — Telephone Encounter (Signed)
Called and spoke with pt letting her know the info stated by MR and that recommendations was for her to have a CT. Pt stated she was fine with [place] ordering the CT as she has been able to get some insurance. Order placed.  Nothing further needed.

## 2020-02-27 NOTE — Telephone Encounter (Signed)
The CXR 02/20/20 shows continued presence of RUIL finding similar to January 30, 2020. These seem new comapred to 2019 and 2020.  Plan  - probably best she get a CT chest without cotnrast - but I know she had $ issues. - please find out if she can afford CT chest over the next few weeks to few months -> if not we can follow with another CXR but she needs to get new insurance Asap

## 2020-03-12 ENCOUNTER — Ambulatory Visit
Admission: RE | Admit: 2020-03-12 | Discharge: 2020-03-12 | Disposition: A | Discharge status: Home / Self Care | Payer: 59 | Source: Ambulatory Visit | Attending: Internal Medicine | Admitting: Internal Medicine

## 2020-03-12 DIAGNOSIS — D869 Sarcoidosis, unspecified: Secondary | ICD-10-CM

## 2020-03-15 ENCOUNTER — Telehealth: Payer: Self-pay | Admitting: Internal Medicine

## 2020-03-15 NOTE — Telephone Encounter (Signed)
Pt had a CT performed 4/26 and is calling requesting to know the results. MR, please advise.

## 2020-03-16 NOTE — Telephone Encounter (Signed)
Just evidence of old sarcoid.  Plan  - if doing well - just make a visit in 1  Year  - if symptomatic - > make a face to face visit   IMPRESSION: 1. Stable patchy areas of scarring type changes in both lungs likely related to the patient's history of sarcoidosis. 2. No worrisome pulmonary nodules or pulmonary infiltrates. 3. No mediastinal or hilar lymphadenopathy.   Electronically Signed   By: Rudie Meyer M.D.   On: 03/12/2020 16:06

## 2020-03-16 NOTE — Telephone Encounter (Signed)
Spoke with pt. She is aware of results. Nothing further needed.

## 2020-09-03 ENCOUNTER — Other Ambulatory Visit: Payer: Self-pay | Admitting: Family Medicine

## 2020-09-03 DIAGNOSIS — Z1231 Encounter for screening mammogram for malignant neoplasm of breast: Secondary | ICD-10-CM

## 2020-10-10 ENCOUNTER — Other Ambulatory Visit: Payer: Self-pay

## 2020-10-10 ENCOUNTER — Ambulatory Visit
Admission: RE | Admit: 2020-10-10 | Discharge: 2020-10-10 | Disposition: A | Discharge status: Home / Self Care | Payer: 59 | Source: Ambulatory Visit | Attending: Family Medicine | Admitting: Family Medicine

## 2020-10-10 DIAGNOSIS — Z1231 Encounter for screening mammogram for malignant neoplasm of breast: Secondary | ICD-10-CM

## 2021-09-22 ENCOUNTER — Other Ambulatory Visit: Payer: Self-pay

## 2021-09-22 ENCOUNTER — Inpatient Hospital Stay (HOSPITAL_COMMUNITY)
Admission: EM | Admit: 2021-09-22 | Discharge: 2021-09-25 | DRG: 193 | Disposition: A | Discharge status: Home / Self Care | Payer: 59 | Attending: Family Medicine | Admitting: Family Medicine

## 2021-09-22 ENCOUNTER — Emergency Department (HOSPITAL_COMMUNITY): Payer: 59

## 2021-09-22 ENCOUNTER — Encounter (HOSPITAL_COMMUNITY): Payer: Self-pay | Admitting: Internal Medicine

## 2021-09-22 DIAGNOSIS — Z20822 Contact with and (suspected) exposure to covid-19: Secondary | ICD-10-CM | POA: Diagnosis present

## 2021-09-22 DIAGNOSIS — Z7722 Contact with and (suspected) exposure to environmental tobacco smoke (acute) (chronic): Secondary | ICD-10-CM | POA: Diagnosis present

## 2021-09-22 DIAGNOSIS — R918 Other nonspecific abnormal finding of lung field: Secondary | ICD-10-CM | POA: Diagnosis present

## 2021-09-22 DIAGNOSIS — I10 Essential (primary) hypertension: Secondary | ICD-10-CM | POA: Diagnosis not present

## 2021-09-22 DIAGNOSIS — R0603 Acute respiratory distress: Secondary | ICD-10-CM

## 2021-09-22 DIAGNOSIS — K222 Esophageal obstruction: Secondary | ICD-10-CM

## 2021-09-22 DIAGNOSIS — J9601 Acute respiratory failure with hypoxia: Secondary | ICD-10-CM | POA: Diagnosis present

## 2021-09-22 DIAGNOSIS — J129 Viral pneumonia, unspecified: Secondary | ICD-10-CM | POA: Diagnosis not present

## 2021-09-22 DIAGNOSIS — D860 Sarcoidosis of lung: Secondary | ICD-10-CM | POA: Diagnosis present

## 2021-09-22 DIAGNOSIS — Z881 Allergy status to other antibiotic agents status: Secondary | ICD-10-CM

## 2021-09-22 DIAGNOSIS — D649 Anemia, unspecified: Secondary | ICD-10-CM | POA: Diagnosis present

## 2021-09-22 DIAGNOSIS — K219 Gastro-esophageal reflux disease without esophagitis: Secondary | ICD-10-CM | POA: Diagnosis not present

## 2021-09-22 DIAGNOSIS — Z79899 Other long term (current) drug therapy: Secondary | ICD-10-CM

## 2021-09-22 DIAGNOSIS — A419 Sepsis, unspecified organism: Secondary | ICD-10-CM

## 2021-09-22 DIAGNOSIS — R0602 Shortness of breath: Secondary | ICD-10-CM | POA: Diagnosis not present

## 2021-09-22 DIAGNOSIS — E872 Acidosis, unspecified: Secondary | ICD-10-CM | POA: Diagnosis not present

## 2021-09-22 DIAGNOSIS — J189 Pneumonia, unspecified organism: Secondary | ICD-10-CM

## 2021-09-22 DIAGNOSIS — D869 Sarcoidosis, unspecified: Secondary | ICD-10-CM

## 2021-09-22 DIAGNOSIS — Z7952 Long term (current) use of systemic steroids: Secondary | ICD-10-CM

## 2021-09-22 DIAGNOSIS — Z9079 Acquired absence of other genital organ(s): Secondary | ICD-10-CM

## 2021-09-22 LAB — BLOOD GAS, VENOUS
Acid-Base Excess: 2.3 mmol/L — ABNORMAL HIGH (ref 0.0–2.0)
Bicarbonate: 25.4 mmol/L (ref 20.0–28.0)
O2 Saturation: 41.2 %
Patient temperature: 98.6
pCO2, Ven: 35.5 mmHg — ABNORMAL LOW (ref 44.0–60.0)
pH, Ven: 7.468 — ABNORMAL HIGH (ref 7.250–7.430)
pO2, Ven: <31 mmHg — CL (ref 32.0–45.0)

## 2021-09-22 LAB — CBC WITH DIFFERENTIAL/PLATELET
Abs Immature Granulocytes: 1.13 10*3/uL — ABNORMAL HIGH (ref 0.00–0.07)
Basophils Absolute: 0.1 10*3/uL (ref 0.0–0.1)
Basophils Relative: 1 %
Eosinophils Absolute: 0 10*3/uL (ref 0.0–0.5)
Eosinophils Relative: 0 %
HCT: 38.1 % (ref 36.0–46.0)
Hemoglobin: 12.8 g/dL (ref 12.0–15.0)
Immature Granulocytes: 8 %
Lymphocytes Relative: 16 %
Lymphs Abs: 2.4 10*3/uL (ref 0.7–4.0)
MCH: 28.7 pg (ref 26.0–34.0)
MCHC: 33.6 g/dL (ref 30.0–36.0)
MCV: 85.4 fL (ref 80.0–100.0)
Monocytes Absolute: 0.3 10*3/uL (ref 0.1–1.0)
Monocytes Relative: 2 %
Neutro Abs: 11 10*3/uL — ABNORMAL HIGH (ref 1.7–7.7)
Neutrophils Relative %: 73 %
Platelets: 456 10*3/uL — ABNORMAL HIGH (ref 150–400)
RBC: 4.46 MIL/uL (ref 3.87–5.11)
RDW: 13.8 % (ref 11.5–15.5)
WBC: 14.9 10*3/uL — ABNORMAL HIGH (ref 4.0–10.5)
nRBC: 0 % (ref 0.0–0.2)

## 2021-09-22 LAB — RESP PANEL BY RT-PCR (FLU A&B, COVID) ARPGX2
Influenza A by PCR: NEGATIVE
Influenza B by PCR: NEGATIVE
SARS Coronavirus 2 by RT PCR: NEGATIVE

## 2021-09-22 LAB — PROTIME-INR
INR: 1 (ref 0.8–1.2)
Prothrombin Time: 13.4 seconds (ref 11.4–15.2)

## 2021-09-22 LAB — BASIC METABOLIC PANEL
Anion gap: 11 (ref 5–15)
BUN: 18 mg/dL (ref 6–20)
CO2: 22 mmol/L (ref 22–32)
Calcium: 9.3 mg/dL (ref 8.9–10.3)
Chloride: 103 mmol/L (ref 98–111)
Creatinine, Ser: 0.7 mg/dL (ref 0.44–1.00)
GFR, Estimated: >60 mL/min (ref >60)
Glucose, Bld: 165 mg/dL — ABNORMAL HIGH (ref 70–99)
Potassium: 3.7 mmol/L (ref 3.5–5.1)
Sodium: 136 mmol/L (ref 135–145)

## 2021-09-22 LAB — D-DIMER, QUANTITATIVE: D-Dimer, Quant: 0.57 ug/mL-FEU — ABNORMAL HIGH (ref 0.00–0.50)

## 2021-09-22 LAB — C-REACTIVE PROTEIN: CRP: 1.7 mg/dL — ABNORMAL HIGH (ref <1.0)

## 2021-09-22 LAB — LACTIC ACID, PLASMA
Lactic Acid, Venous: 2.6 mmol/L (ref 0.5–1.9)
Lactic Acid, Venous: 1.8 mmol/L (ref 0.5–1.9)

## 2021-09-22 LAB — BRAIN NATRIURETIC PEPTIDE: B Natriuretic Peptide: 45 pg/mL (ref 0.0–100.0)

## 2021-09-22 MED ORDER — ACETAMINOPHEN 650 MG RE SUPP: 650.0000 mg | Freq: Four times a day (QID) | RECTAL | Status: DC | PRN

## 2021-09-22 MED ORDER — IPRATROPIUM BROMIDE 0.02 % IN SOLN
0.5000 mg | Freq: Once | RESPIRATORY_TRACT | Status: AC
  Administered 2021-09-22: 0.5 mg via RESPIRATORY_TRACT
  Filled 2021-09-22: qty 2.5

## 2021-09-22 MED ORDER — LOSARTAN POTASSIUM-HCTZ 50-12.5 MG PO TABS: 1.0000 | ORAL_TABLET | Freq: Every day | ORAL | Status: DC

## 2021-09-22 MED ORDER — SODIUM CHLORIDE 0.9 % IV SOLN
2.0000 g | Freq: Once | INTRAVENOUS | Status: AC
  Administered 2021-09-22: 2 g via INTRAVENOUS
  Filled 2021-09-22: qty 2

## 2021-09-22 MED ORDER — HYDROCHLOROTHIAZIDE 12.5 MG PO TABS
12.5000 mg | ORAL_TABLET | Freq: Every day | ORAL | Status: DC
  Administered 2021-09-23 – 2021-09-25 (×3): 12.5 mg via ORAL
  Filled 2021-09-22 (×3): qty 1

## 2021-09-22 MED ORDER — METHYLPREDNISOLONE SODIUM SUCC 125 MG IJ SOLR
125.0000 mg | Freq: Once | INTRAMUSCULAR | Status: AC
  Administered 2021-09-22: 125 mg via INTRAVENOUS
  Filled 2021-09-22: qty 2

## 2021-09-22 MED ORDER — ACETAMINOPHEN 325 MG PO TABS
650.0000 mg | ORAL_TABLET | Freq: Four times a day (QID) | ORAL | Status: DC | PRN
  Administered 2021-09-23 – 2021-09-24 (×2): 650 mg via ORAL
  Filled 2021-09-22 (×2): qty 2

## 2021-09-22 MED ORDER — LOSARTAN POTASSIUM 50 MG PO TABS
50.0000 mg | ORAL_TABLET | Freq: Every day | ORAL | Status: DC
  Administered 2021-09-23 – 2021-09-25 (×3): 50 mg via ORAL
  Filled 2021-09-22 (×3): qty 1

## 2021-09-22 MED ORDER — ALBUTEROL (5 MG/ML) CONTINUOUS INHALATION SOLN
10.0000 mg/h | INHALATION_SOLUTION | Freq: Once | RESPIRATORY_TRACT | Status: AC
  Administered 2021-09-22: 10 mg/h via RESPIRATORY_TRACT

## 2021-09-22 MED ORDER — ENOXAPARIN SODIUM 40 MG/0.4ML IJ SOSY
40.0000 mg | PREFILLED_SYRINGE | INTRAMUSCULAR | Status: DC
  Administered 2021-09-22 – 2021-09-24 (×3): 40 mg via SUBCUTANEOUS
  Filled 2021-09-22 (×3): qty 0.4

## 2021-09-22 MED ORDER — LACTATED RINGERS IV SOLN: INTRAVENOUS | Status: DC

## 2021-09-22 MED ORDER — MELATONIN 5 MG PO TABS
10.0000 mg | ORAL_TABLET | Freq: Every evening | ORAL | Status: DC | PRN
  Administered 2021-09-23 (×2): 10 mg via ORAL
  Filled 2021-09-22 (×2): qty 2

## 2021-09-22 MED ORDER — PANTOPRAZOLE SODIUM 40 MG PO TBEC
40.0000 mg | DELAYED_RELEASE_TABLET | Freq: Every day | ORAL | Status: DC
  Administered 2021-09-25: 40 mg via ORAL
  Filled 2021-09-22 (×2): qty 1

## 2021-09-22 MED ORDER — ONDANSETRON HCL 4 MG/2ML IJ SOLN: 4.0000 mg | Freq: Four times a day (QID) | INTRAMUSCULAR | Status: DC | PRN

## 2021-09-22 MED ORDER — POLYETHYLENE GLYCOL 3350 17 G PO PACK: 17.0000 g | PACK | Freq: Every day | ORAL | Status: DC | PRN

## 2021-09-22 MED ORDER — ONDANSETRON HCL 4 MG PO TABS: 4.0000 mg | ORAL_TABLET | Freq: Four times a day (QID) | ORAL | Status: DC | PRN

## 2021-09-22 MED ORDER — SODIUM CHLORIDE 0.9 % IV SOLN
500.0000 mg | Freq: Once | INTRAVENOUS | Status: AC
  Administered 2021-09-22: 500 mg via INTRAVENOUS
  Filled 2021-09-22: qty 500

## 2021-09-22 NOTE — Progress Notes (Signed)
Sepsis tracking by eLINK 

## 2021-09-22 NOTE — ED Notes (Signed)
Dr. Rhunette Croft aware critical PO2<31.0.

## 2021-09-22 NOTE — Progress Notes (Signed)
Pharmacy Note   A consult was received from an ED physician for cefepime per pharmacy dosing.    The patient's profile has been reviewed for ht/wt/allergies/indication/available labs.    A one time order has been placed for cefepime 2 gr IV x1 .    Further antibiotics/pharmacy consults should be ordered by admitting physician if indicated.                       Thank you,  Adalberto Cole, PharmD, BCPS 09/22/2021 7:13 PM

## 2021-09-22 NOTE — H&P (Signed)
History and Physical    Lauren Russo [id number] DOB: 01/12/1971 DOA: 09/22/2021  PCP: Vernie Shanks, MD  Patient coming from: Home   Chief Complaint:  Chief Complaint  Patient presents with   Shortness of Breath     HPI:    49 year old female with past medical history of pulmonary sarcoidosis, gastroesophageal reflux disease, hypertension, GERD complicated by esophageal stricture status post dilation in the past, history of SVT, anemia, eczema who presents to [facility] emergency department complaints of shortness of breath and cough.  Patient explains that approximately 1 week ago she began to experience shortness of breath.  The shortness of breath is associated with cough that she states is productive of green sputum.  As the days progressed patient's symptoms of shortness of breath became particularly severe, worse with exertion and improved with rest.  This is associated with progressively worsening generalized weakness.  Patient initially had a telemedicine eval with her PCP on Thursday and was put on daily prednisone.    Patient symptoms continued to worsen until she eventually presented to see her primary care provider on 11/5.   during that visit patient was placed on oral Doxycycline therapy but despite this her symptoms continued to worsen and she eventually presented to [facility] emergency department today for evaluation.  Of note, patient denies any associated fever, sick contacts, recent travel or contact with confirmed COVID-19 infection.  Upon evaluation in the emergency department patient was initially in respiratory distress and was initially placed on supplemental oxygen upon arrival.  Patient was noted to be in respiratory distress and wheezing and therefore was administered intravenous Solu-Medrol as well as several rounds of nebulized bronchodilators.  Chest x-ray revealed peribronchial opacities in the bilateral lung bases and patient  was administered doses of intravenous cefepime and azithromycin as well.  Patient was noted to have a mild lactic acidosis of 2.6.  ER provider was concerned about possible sarcoidosis flare with atypical pneumonia.  The hospitalist group was then called to assess the patient for admission to the hospital.  Review of Systems:   Review of Systems  Respiratory:  Positive for cough, sputum production, shortness of breath and wheezing.   Neurological:  Positive for weakness.  All other systems reviewed and are negative.  Past Medical History:  Diagnosis Date   Hypertension    Ovarian cyst    Pulmonary nodule    Sarcoid    SVT (supraventricular tachycardia) ([place])    Tubal pregnancy     Past Surgical History:  Procedure Laterality Date   LAPAROSCOPIC SALPINGOOPHERECTOMY     unsure of which side.    TUBAL LIGATION       reports that she is a non-smoker but has been exposed to tobacco smoke. She has never used smokeless tobacco. She reports current alcohol use of about 2.0 standard drinks per week. She reports that she does not use drugs.  Allergies  Allergen Reactions   Azithromycin     Other reaction(s): rash/ palpitations   Doxycycline     Per pt causes N/V/D    Family History  Problem Relation Age of Onset   Heart disease Mother    Heart disease Father    Breast cancer Maternal Aunt      Prior to Admission medications   Medication Sig Start Date End Date Taking? Authorizing Provider  acetaminophen (TYLENOL) 500 MG tablet Take 500 mg by mouth every 6 (six) hours as needed for headache (pain).  [provider]  amoxicillin (AMOXIL) 500 MG capsule Take 1 capsule (500 mg total) by mouth 3 (three) times daily. 02/02/20   Brand Males, MD  augmented betamethasone dipropionate (DIPROLENE-AF) 0.05 % ointment Apply 1 application topically daily as needed (eczema flares).  05/05/19   [provider]  hydrochlorothiazide (HYDRODIURIL) 12.5 MG tablet Take 12.5  mg by mouth every morning. 02/01/19   [provider]  ibuprofen (ADVIL,MOTRIN) 800 MG tablet Take 1 tablet (800 mg total) by mouth every 8 (eight) hours as needed for moderate pain. 01/24/18   Orpah Greek, MD  losartan (COZAAR) 100 MG tablet Take 50 mg by mouth every morning. 02/09/19   [provider]  methocarbamol (ROBAXIN) 500 MG tablet Take 1 tablet (500 mg total) by mouth at bedtime as needed for muscle spasms. Patient not taking: Reported on 01/30/2020 07/15/19   Caccavale, Sophia, PA-C  pantoprazole (PROTONIX) 20 MG tablet Take 1 tablet (20 mg total) by mouth daily. Patient not taking: Reported on 01/30/2020 07/15/19   Caccavale, Sophia, PA-C  potassium chloride SA (K-DUR,KLOR-CON) 20 MEQ tablet Take 2 tablets (40 mEq total) by mouth 2 (two) times daily. Patient not taking: Reported on 01/24/2018 05/23/17 01/25/24  Servando Salina, MD  Probiotic Product (PROBIOTIC PO) Take 1 tablet by mouth daily.    [provider]  tacrolimus (PROTOPIC) 0.1 % ointment Apply 1 application topically 2 (two) times daily as needed (eczema flares).  05/09/19   [provider]  vitamin C (ASCORBIC ACID) 500 MG tablet Take 500 mg by mouth daily.    [provider]    Physical Exam: Vitals:   09/22/21 2200 09/22/21 2300 09/23/21 0107 09/23/21 0243  BP: (!) 144/91 (!) 157/92 (!) 149/90 (!) 145/100  Pulse: 84 82 80 78  Resp: (!) 21 18 (!) 22 18  Temp:    97.8 F (36.6 C)  TempSrc:    Oral  SpO2: 97% 96% 95% 99%  Weight:      Height:         Constitutional: Awake alert and oriented x3, no associated distress.   Skin: no rashes, no lesions, good skin turgor noted. Eyes: Pupils are equally reactive to light.  No evidence of scleral icterus or conjunctival pallor.  ENMT: Moist mucous membranes noted.  Posterior pharynx clear of any exudate or lesions.   Neck: normal, supple, no masses, no thyromegaly.  No evidence of jugular venous distension.    Respiratory: Notable expiratory wheezing in all fields with scattered ronchi B/L.  No rales.   Normal respiratory effort. No accessory muscle use.  Cardiovascular: Regular rate and rhythm, no murmurs / rubs / gallops. No extremity edema. 2+ pedal pulses. No carotid bruits.  Chest:   Nontender without crepitus or deformity.   Back:   Nontender without crepitus or deformity. Abdomen: Abdomen is soft and nontender.  No evidence of intra-abdominal masses.  Positive bowel sounds noted in all quadrants.   Musculoskeletal: No joint deformity upper and lower extremities. Good ROM, no contractures. Normal muscle tone.  Neurologic: CN 2-12 grossly intact. Sensation intact.  Patient moving all 4 extremities spontaneously.  Patient is following all commands.  Patient is responsive to verbal stimuli.   Psychiatric: Patient exhibits normal mood with appropriate affect.  Patient seems to possess insight as to their current situation.     Labs on Admission: I have personally reviewed following labs and imaging studies -   CBC: Recent Labs  Lab 09/22/21 1654  WBC 14.9*  NEUTROABS 11.0*  HGB 12.8  HCT 38.1  MCV 85.4  PLT 99991111*   Basic Metabolic Panel: Recent Labs  Lab 09/22/21 1654  NA 136  K 3.7  CL 103  CO2 22  GLUCOSE 165*  BUN 18  CREATININE 0.70  CALCIUM 9.3   GFR: Estimated Creatinine Clearance: 82.3 mL/min (by C-G formula based on SCr of 0.7 mg/dL). Liver Function Tests: No results for input(s): AST, ALT, ALKPHOS, BILITOT, PROT, ALBUMIN in the last 168 hours. No results for input(s): LIPASE, AMYLASE in the last 168 hours. No results for input(s): AMMONIA in the last 168 hours. Coagulation Profile: Recent Labs  Lab 09/22/21 1933  INR 1.0   Cardiac Enzymes: No results for input(s): CKTOTAL, CKMB, CKMBINDEX, TROPONINI in the last 168 hours. BNP (last 3 results) No results for input(s): PROBNP in the last 8760 hours. HbA1C: No results for input(s): HGBA1C in the last 72  hours. CBG: No results for input(s): GLUCAP in the last 168 hours. Lipid Profile: No results for input(s): CHOL, HDL, LDLCALC, TRIG, CHOLHDL, LDLDIRECT in the last 72 hours. Thyroid Function Tests: No results for input(s): TSH, T4TOTAL, FREET4, T3FREE, THYROIDAB in the last 72 hours. Anemia Panel: No results for input(s): VITAMINB12, FOLATE, FERRITIN, TIBC, IRON, RETICCTPCT in the last 72 hours. Urine analysis:    Component Value Date/Time   COLORURINE STRAW (A) 07/15/2019 1935   APPEARANCEUR CLEAR 07/15/2019 1935   LABSPEC 1.006 07/15/2019 1935   PHURINE 7.0 07/15/2019 [place] NEGATIVE 07/15/2019 [place] NEGATIVE 07/15/2019 [place] NEGATIVE 07/15/2019 1935   KETONESUR 5 (A) 07/15/2019 1935   PROTEINUR NEGATIVE 07/15/2019 1935   UROBILINOGEN 0.2 05/20/2015 1700   NITRITE NEGATIVE 07/15/2019 1935   LEUKOCYTESUR NEGATIVE 07/15/2019 1935    Radiological Exams on Admission - Personally Reviewed: CT Angio Chest Pulmonary Embolism (PE) W or WO Contrast  Result Date: 09/23/2021 CLINICAL DATA:  Shortness of breath for 1 week, history of sarcoidosis EXAM: CT ANGIOGRAPHY CHEST WITH CONTRAST TECHNIQUE: Multidetector CT imaging of the chest was performed using the standard protocol during bolus administration of intravenous contrast. Multiplanar CT image reconstructions and MIPs were obtained to evaluate the vascular anatomy. CONTRAST:  30mL OMNIPAQUE IOHEXOL 350 MG/ML SOLN COMPARISON:  03/12/2020 FINDINGS: Cardiovascular: Thoracic aorta and its branches are well visualized. No aneurysmal dilatation or dissection is noted. Heart is at the upper limits of normal in size. Pulmonary artery shows a normal branching pattern. No discrete filling defect is identified to suggest pulmonary embolism. Mediastinum/Nodes: Thoracic inlet is within normal limits. No hilar or mediastinal adenopathy is noted. The esophagus as visualized is within normal limits. Lungs/Pleura: Lungs are well  aerated bilaterally. Small patchy areas of scarring are noted bilaterally consistent with the given clinical history. Mild bibasilar atelectatic changes are seen. No sizable effusion is noted. No parenchymal nodules are noted. Upper Abdomen: Visualized upper abdomen is unremarkable. Musculoskeletal: Mild degenerative changes of the thoracic spine are noted. No acute bony abnormality is noted. Review of the MIP images confirms the above findings. IMPRESSION: Stable patchy areas of scarring consistent with the given clinical history of sarcoidosis. No sizable adenopathy is noted. No evidence of pulmonary emboli. Electronically Signed   By: Inez Catalina M.D.   On: 09/23/2021 01:45   DG Chest Port 1 View  Result Date: 09/22/2021 CLINICAL DATA:  Productive cough. EXAM: PORTABLE CHEST 1 VIEW COMPARISON:  September 21, 2021 FINDINGS: Cardiomediastinal silhouette is normal. Mediastinal contours appear intact. Low lung volumes. Subtle  peribronchial airspace opacities in bilateral lung bases. Osseous structures are without acute abnormality. Soft tissues are grossly normal. IMPRESSION: Subtle peribronchial airspace opacities in bilateral lung bases may represent atelectasis or atypical/viral pneumonia. Electronically Signed   By: Ted Mcalpine M.D.   On: 09/22/2021 17:51    EKG: Personally reviewed.  Rhythm is sinus tachycardia with heart rate of 100 bpm.  No dynamic ST segment changes appreciated.  Assessment/Plan  * Shortness of breath Patient presenting with progressively worsening shortness of breath and productive cough with green sputum Progressive symptoms despite recent initiation of outpatient regimen of oral doxycycline and prednisone  Patient does does exhibit multiple SIRS criteria including tachycardia and leukocytosis however there is no evidence of concurrent organ dysfunction to suggest sepsis Leukocytosis is likely secondary to recent prednisone use.  CRP unremarkable, DDimer slightly  elevated so therefore patient sent for CTA chest CTA chest reveals no pneumonia I therefore believe patient is suffering from a likely viral pneumonia which is causing significant bronchospasm in the setting of her pulmonary sarcoidosis.   Starting on scheduled systemic steroids and aggressive bronchodilator therapy.  No abx for now.  Will obtain respiratory viral pcr panel Blood cultures have been obtained as well Supplemental oxygen to maintain oxygen saturation above 92%.  Pulmonary sarcoidosis (HCC) Please see assessment and plan as noted above.    Lactic acidosis Patient presenting with lactic acidosis, possibly secondary to volume depletion.  Alternatively it could be a type B lactic acidosis secondary to several rounds of bronchodilator therapy patient received earlier this afternoon. Hydrating patient with intravenous isotonic fluid Treating underlying medical condition as mentioned above Performing serial lactic acid levels to ensure downtrending and resolution  Essential hypertension Resume patients home regimen of oral antihypertensives Titrate antihypertensive regimen as necessary to achieve adequate BP control PRN intravenous antihypertensives for excessively elevated blood pressure    GERD with stricture Daily PPI      Code Status:  Full code  code status decision has been confirmed with: patient Family Communication: deferred   Status is: Observation  The patient remains OBS appropriate and will d/c before 2 midnights.       Marinda Elk MD [facility] Pager [phone]  If 7PM-7AM, please contact night-coverage [email or web address] Use universal [place] password for that web site. If you do not have the password, please call the hospital operator.  09/23/2021, 5:05 AM

## 2021-09-22 NOTE — Assessment & Plan Note (Addendum)
   Patient presenting with progressively worsening shortness of breath and productive cough with green sputum  Progressive symptoms despite recent initiation of outpatient regimen of oral doxycycline and prednisone   Patient does does exhibit multiple SIRS criteria including tachycardia and leukocytosis however there is no evidence of concurrent organ dysfunction to suggest sepsis  Leukocytosis is likely secondary to recent prednisone use.   CRP unremarkable, DDimer slightly elevated so therefore patient sent for CTA chest  CTA chest reveals no pneumonia  I therefore believe patient is suffering from a likely viral pneumonia which is causing significant bronchospasm in the setting of her pulmonary sarcoidosis.    Starting on scheduled systemic steroids and aggressive bronchodilator therapy.  No abx for now.   Will obtain respiratory viral pcr panel  Blood cultures have been obtained as well  Supplemental oxygen to maintain oxygen saturation above 92%.

## 2021-09-22 NOTE — ED Provider Notes (Signed)
[facility]-EMERGENCY DEPT Provider Note   CSN: [id number] Arrival date & time: 09/22/21  1607     History Chief Complaint  Patient presents with   Shortness of Breath    Lauren Russo is a 50 y.o. female.  HPI     51 year old female comes in with chief complaint of shortness of breath. Patient has history of sarcoidosis, PSVT.  She reports that she has been having shortness of breath for the last few days, saw her PCP and was started on antibiotics yesterday.  Today her breathing got worse and she decided to come to the ER.  In general, her sarcoidosis is well controlled.  She denies any chest pain.  No history of PE, DVT  Past Medical History:  Diagnosis Date   Hypertension    Ovarian cyst    Pulmonary nodule    Sarcoid    SVT (supraventricular tachycardia) (HCC)    Tubal pregnancy     Patient Active Problem List   Diagnosis Date Noted   Screening breast examination 08/23/2019   Breast pain, left 08/23/2019   Acute bronchitis 11/24/2015   Sinusitis, acute frontal 11/24/2015   Left-sided chest wall pain 12/14/2014   Essential hypertension, benign 12/10/2014   Pain in the chest    SVT (supraventricular tachycardia) (HCC) 12/09/2014   Hypokalemia 12/09/2014   GERD with stricture 12/09/2014   EKG abnormality 12/09/2014   Sarcoidosis of lung (HCC) 11/16/2012   Lung mass 10/31/2012   Chest pain 10/31/2012   Normocytic anemia 10/31/2012   Hyperglycemia 10/31/2012   Mediastinal adenopathy 10/31/2012   Hilar adenopathy 10/31/2012   Multiple pulmonary nodules 10/31/2012    Past Surgical History:  Procedure Laterality Date   LAPAROSCOPIC SALPINGOOPHERECTOMY     unsure of which side.    TUBAL LIGATION       OB History     Gravida  4   Para  2   Term  2   Preterm      AB  2   Living  2      SAB      IAB  1   Ectopic  1   Multiple      Live Births              Family History  Problem Relation Age of Onset    Heart disease Mother    Heart disease Father    Breast cancer Maternal Aunt     Social History   Tobacco Use   Smoking status: Passive Smoke Exposure - Never Smoker   Smokeless tobacco: Never   Tobacco comments:    reports smokes socially x1 month  Substance Use Topics   Alcohol use: Yes    Alcohol/week: 2.0 standard drinks    Types: 2 Glasses of wine per week    Comment: Occassional   Drug use: No    Home Medications Prior to Admission medications   Medication Sig Start Date End Date Taking? Authorizing Provider  acetaminophen (TYLENOL) 500 MG tablet Take 500 mg by mouth every 6 (six) hours as needed for headache (pain).    [provider]  amoxicillin (AMOXIL) 500 MG capsule Take 1 capsule (500 mg total) by mouth 3 (three) times daily. 02/02/20   Kalman Shan, MD  augmented betamethasone dipropionate (DIPROLENE-AF) 0.05 % ointment Apply 1 application topically daily as needed (eczema flares).  05/05/19   [provider]  hydrochlorothiazide (HYDRODIURIL) 12.5 MG tablet Take 12.5 mg by mouth every morning.  02/01/19   [provider]  ibuprofen (ADVIL,MOTRIN) 800 MG tablet Take 1 tablet (800 mg total) by mouth every 8 (eight) hours as needed for moderate pain. 01/24/18   Gilda Crease, MD  losartan (COZAAR) 100 MG tablet Take 50 mg by mouth every morning. 02/09/19   [provider]  methocarbamol (ROBAXIN) 500 MG tablet Take 1 tablet (500 mg total) by mouth at bedtime as needed for muscle spasms. Patient not taking: Reported on 01/30/2020 07/15/19   Caccavale, Sophia, PA-C  pantoprazole (PROTONIX) 20 MG tablet Take 1 tablet (20 mg total) by mouth daily. Patient not taking: Reported on 01/30/2020 07/15/19   Caccavale, Sophia, PA-C  potassium chloride SA (K-DUR,KLOR-CON) 20 MEQ tablet Take 2 tablets (40 mEq total) by mouth 2 (two) times daily. Patient not taking: Reported on 01/24/2018 05/23/17 01/25/24  Maxie Better, MD  Probiotic Product  (PROBIOTIC PO) Take 1 tablet by mouth daily.    [provider]  tacrolimus (PROTOPIC) 0.1 % ointment Apply 1 application topically 2 (two) times daily as needed (eczema flares).  05/09/19   [provider]  vitamin C (ASCORBIC ACID) 500 MG tablet Take 500 mg by mouth daily.    [provider]    Allergies    Doxycycline  Review of Systems   Review of Systems  Unable to perform ROS: Severe respiratory distress  Constitutional:  Positive for activity change.  Respiratory:  Positive for cough and shortness of breath.    Physical Exam Updated Vital Signs BP (!) 147/92   Pulse 98   Resp 17   Ht 4\' 11"  (1.499 m)   Wt 86.6 kg   SpO2 95%   BMI 38.58 kg/m   Physical Exam Vitals and nursing note reviewed.  Constitutional:      Appearance: She is well-developed.  HENT:     Head: Atraumatic.  Cardiovascular:     Rate and Rhythm: Normal rate.  Pulmonary:     Effort: Tachypnea and respiratory distress present.     Breath sounds: Examination of the right-upper field reveals wheezing. Examination of the left-upper field reveals wheezing. Decreased breath sounds and wheezing present.  Musculoskeletal:     Cervical back: Normal range of motion and neck supple.  Skin:    General: Skin is warm and dry.  Neurological:     Mental Status: She is alert and oriented to person, place, and time.    ED Results / Procedures / Treatments   Labs (all labs ordered are listed, but only abnormal results are displayed) Labs Reviewed  CBC WITH DIFFERENTIAL/PLATELET - Abnormal; Notable for the following components:      Result Value   WBC 14.9 (*)    Platelets 456 (*)    Neutro Abs 11.0 (*)    Abs Immature Granulocytes 1.13 (*)    All other components within normal limits  BASIC METABOLIC PANEL - Abnormal; Notable for the following components:   Glucose, Bld 165 (*)    All other components within normal limits  BLOOD GAS, VENOUS - Abnormal; Notable for the following  components:   pH, Ven 7.468 (*)    pCO2, Ven 35.5 (*)    pO2, Ven <31.0 (*)    Acid-Base Excess 2.3 (*)    All other components within normal limits  LACTIC ACID, PLASMA - Abnormal; Notable for the following components:   Lactic Acid, Venous 2.6 (*)    All other components within normal limits  RESP PANEL BY RT-PCR (FLU A&B, COVID)  ARPGX2  CULTURE, BLOOD (ROUTINE X 2)  CULTURE, BLOOD (ROUTINE X 2)  BRAIN NATRIURETIC PEPTIDE  PROTIME-INR  LACTIC ACID, PLASMA    EKG EKG Interpretation  Date/Time:  Sunday September 22 2021 19:27:32 EST Ventricular Rate:  100 PR Interval:  141 QRS Duration: 93 QT Interval:  407 QTC Calculation: 525 R Axis:   46 Text Interpretation: Sinus tachycardia Borderline repolarization abnormality Prolonged QT interval No acute changes no significant changes Confirmed by Derwood Kaplan ([id number]) on 09/22/2021 8:34:33 PM  Radiology DG Chest Port 1 View  Result Date: 09/22/2021 CLINICAL DATA:  Productive cough. EXAM: PORTABLE CHEST 1 VIEW COMPARISON:  September 21, 2021 FINDINGS: Cardiomediastinal silhouette is normal. Mediastinal contours appear intact. Low lung volumes. Subtle peribronchial airspace opacities in bilateral lung bases. Osseous structures are without acute abnormality. Soft tissues are grossly normal. IMPRESSION: Subtle peribronchial airspace opacities in bilateral lung bases may represent atelectasis or atypical/viral pneumonia. Electronically Signed   By: Ted Mcalpine M.D.   On: 09/22/2021 17:51    Procedures .Critical Care E&M Performed by: Derwood Kaplan, MD  Critical care provider statement:    Critical care time (minutes):  52   Critical care was necessary to treat or prevent imminent or life-threatening deterioration of the following conditions:  Respiratory failure   Critical care was time spent personally by me on the following activities:  Blood draw for specimens, discussions with consultants, discussions with primary provider,  evaluation of patient's response to treatment, examination of patient, interpretation of cardiac output measurements, obtaining history from patient or surrogate, ordering and performing treatments and interventions, ordering and review of laboratory studies, ordering and review of radiographic studies, pulse oximetry, review of old charts and re-evaluation of patient's condition After initial E/M assessment, critical care services were subsequently performed that were exclusive of separately billable procedures or treatment.     Medications Ordered in ED Medications  lactated ringers infusion ( Intravenous New Bag/Given 09/22/21 1927)  azithromycin (ZITHROMAX) 500 mg in sodium chloride 0.9 % 250 mL IVPB (500 mg Intravenous New Bag/Given 09/22/21 2027)  albuterol (PROVENTIL,VENTOLIN) solution continuous neb (10 mg/hr Nebulization Given 09/22/21 1706)  ipratropium (ATROVENT) nebulizer solution 0.5 mg (0.5 mg Nebulization Given 09/22/21 1705)  methylPREDNISolone sodium succinate (SOLU-MEDROL) 125 mg/2 mL injection 125 mg (125 mg Intravenous Given 09/22/21 1735)  ceFEPIme (MAXIPIME) 2 g in sodium chloride 0.9 % 100 mL IVPB (0 g Intravenous Stopped 09/22/21 2027)    ED Course  I have reviewed the triage vital signs and the nursing notes.  Pertinent labs & imaging results that were available during my care of the patient were reviewed by me and considered in my medical decision making (see chart for details).  Clinical Course as of 09/22/21 2101  Sun Sep 22, 2021  1700 WBC(!): 14.9 Patient has elevated white count with left shift.  The left shift could have been because of the prednisone that she started recently.  However, at this time we think it will be most appropriate to give her antibiotics and reassess.  She is receiving breathing treatment on reassessment.  Reports that nebulizer treatment is helping. [AN]  2059 Patient reassessed.  Reports that her breathing has improved since the time she  arrived to the ER, however she still feels short of breath and unable to complete full sentences.  At this time, we have decided to admit her to the hospital with suspicion for CAP with leading to flareup of her sarcoidosis. [AN]  2100 Lactic Acid, Venous(!!): 2.6 Receiving IV  fluids [AN]    Clinical Course User Index [AN] Derwood Kaplan, MD   MDM Rules/Calculators/A&P                           50 year old comes in with chief complaint of shortness of breath. She has history of sarcoidosis.  Patient arrives with respiratory distress, ordering albuterol continuously in the ED.  X-rays ordered.  Final Clinical Impression(s) / ED Diagnoses Final diagnoses:  Sarcoidosis  Respiratory distress  Community acquired pneumonia, unspecified laterality    Rx / DC Orders ED Discharge Orders     None        Derwood Kaplan, MD 09/22/21 2101

## 2021-09-22 NOTE — Assessment & Plan Note (Signed)
.   Resume patients home regimen of oral antihypertensives . Titrate antihypertensive regimen as necessary to achieve adequate BP control . PRN intravenous antihypertensives for excessively elevated blood pressure   

## 2021-09-22 NOTE — Assessment & Plan Note (Signed)
   Patient presenting with lactic acidosis, possibly secondary to volume depletion.  Alternatively it could be a type B lactic acidosis secondary to several rounds of bronchodilator therapy patient received earlier this afternoon.  Hydrating patient with intravenous isotonic fluid  Treating underlying medical condition as mentioned above  Performing serial lactic acid levels to ensure downtrending and resolution

## 2021-09-22 NOTE — ED Triage Notes (Signed)
Patient reports feeling SOB for a week. Saw dr yesterday who prescribed abx. Patient has hx of sarcoidosis.   Patient has been coughing up thick green sputum

## 2021-09-22 NOTE — Assessment & Plan Note (Signed)
   Daily PPI 

## 2021-09-23 ENCOUNTER — Encounter (HOSPITAL_COMMUNITY): Payer: Self-pay | Admitting: Internal Medicine

## 2021-09-23 ENCOUNTER — Observation Stay (HOSPITAL_COMMUNITY): Payer: 59

## 2021-09-23 DIAGNOSIS — D860 Sarcoidosis of lung: Secondary | ICD-10-CM | POA: Diagnosis present

## 2021-09-23 DIAGNOSIS — R0602 Shortness of breath: Secondary | ICD-10-CM | POA: Diagnosis not present

## 2021-09-23 LAB — RESPIRATORY PANEL BY PCR

## 2021-09-23 LAB — PROCALCITONIN: Procalcitonin: <0.1 ng/mL

## 2021-09-23 LAB — HIV ANTIBODY (ROUTINE TESTING W REFLEX): HIV Screen 4th Generation wRfx: NONREACTIVE

## 2021-09-23 MED ORDER — ALBUTEROL SULFATE (2.5 MG/3ML) 0.083% IN NEBU
2.5000 mg | INHALATION_SOLUTION | Freq: Four times a day (QID) | RESPIRATORY_TRACT | Status: DC
  Administered 2021-09-23 – 2021-09-24 (×5): 2.5 mg via RESPIRATORY_TRACT
  Filled 2021-09-23 (×7): qty 3

## 2021-09-23 MED ORDER — GUAIFENESIN-CODEINE 100-10 MG/5ML PO SOLN
10.0000 mL | Freq: Four times a day (QID) | ORAL | Status: DC | PRN
  Administered 2021-09-23: 10 mL via ORAL
  Filled 2021-09-23: qty 10

## 2021-09-23 MED ORDER — IOHEXOL 350 MG/ML SOLN
75.0000 mL | Freq: Once | INTRAVENOUS | Status: AC | PRN
  Administered 2021-09-23: 75 mL via INTRAVENOUS

## 2021-09-23 MED ORDER — PREDNISONE 5 MG PO TABS
50.0000 mg | ORAL_TABLET | Freq: Every day | ORAL | Status: DC
  Administered 2021-09-23 – 2021-09-25 (×3): 50 mg via ORAL
  Filled 2021-09-23 (×3): qty 2

## 2021-09-23 MED ORDER — ALBUTEROL SULFATE (2.5 MG/3ML) 0.083% IN NEBU
2.5000 mg | INHALATION_SOLUTION | RESPIRATORY_TRACT | Status: DC | PRN
  Administered 2021-09-23 (×2): 2.5 mg via RESPIRATORY_TRACT
  Filled 2021-09-23: qty 3

## 2021-09-23 NOTE — Assessment & Plan Note (Signed)
Please see assessment and plan as noted above.

## 2021-09-23 NOTE — ED Notes (Signed)
Blood cultures x2 both sets collected at this time

## 2021-09-23 NOTE — Progress Notes (Signed)
Patient ambulated 100 ft, oxygen saturation went as low as 85% on room air.

## 2021-09-23 NOTE — ED Notes (Signed)
ED TO INPATIENT HANDOFF REPORT  ED Nurse Name and Phone #:   S Name/Age/Gender Lauren Russo 50 y.o. female Room/Bed: WA18/WA18  Code Status   Code Status: Full Code  Home/SNF/Other Home Patient oriented to: self, place, time, and situation Is this baseline? Yes   Triage Complete: Triage complete  Chief Complaint Shortness of breath [R06.02]  Triage Note Patient reports feeling SOB for a week. Saw dr yesterday who prescribed abx. Patient has hx of sarcoidosis.   Patient has been coughing up thick green sputum    Allergies Allergies  Allergen Reactions   Azithromycin     Other reaction(s): rash/ palpitations   Doxycycline     Per pt causes N/V/D    Level of Care/Admitting Diagnosis ED Disposition     ED Disposition  Admit   Condition  --   Comment  [facility] [[id number]]  Level of Care: Med-Surg [16]  [facility] if equivalent level of care is available:: No  Covid Evaluation: Confirmed COVID Negative  Diagnosis: Shortness of breath [786.05.ICD-9-CM]  Admitting Physician: Marinda ElkSHALHOUB, GEORGE J [[id number]]  Attending Physician: Marinda ElkSHALHOUB, GEORGE J [[id number]]          B Medical/Surgery History Past Medical History:  Diagnosis Date   Hypertension    Ovarian cyst    Pulmonary nodule    Sarcoid    SVT (supraventricular tachycardia) (HCC)    Tubal pregnancy    Past Surgical History:  Procedure Laterality Date   LAPAROSCOPIC SALPINGOOPHERECTOMY     unsure of which side.    TUBAL LIGATION       A IV Location/Drains/Wounds Patient Lines/Drains/Airways Status     Active Line/Drains/Airways     Name Placement date Placement time Site Days   Peripheral IV 09/22/21 20 G Right Antecubital 09/22/21  1600  Antecubital  1   Peripheral IV 09/22/21 20 G Left Antecubital 09/22/21  1600  Antecubital  1   Incision 11/03/12 Back Right;Upper 11/03/12  1500  -- 3246             Intake/Output Last 24 hours No intake or output data in the 24 hours ending 09/23/21 0121  Labs/Imaging Results for orders placed or performed during the hospital encounter of 09/22/21 (from the past 48 hour(s))  CBC WITH DIFFERENTIAL     Status: Abnormal   Collection Time: 09/22/21  4:54 PM  Result Value Ref Range   WBC 14.9 (H) 4.0 - 10.5 K/uL   RBC 4.46 3.87 - 5.11 MIL/uL   Hemoglobin 12.8 12.0 - 15.0 g/dL   HCT 40.138.1 02.736.0 - 25.346.0 %   MCV 85.4 80.0 - 100.0 fL   MCH 28.7 26.0 - 34.0 pg   MCHC 33.6 30.0 - 36.0 g/dL   RDW 66.413.8 40.311.5 - 47.415.5 %   Platelets 456 (H) 150 - 400 K/uL   nRBC 0.0 0.0 - 0.2 %   Neutrophils Relative % 73 %   Neutro Abs 11.0 (H) 1.7 - 7.7 K/uL   Lymphocytes Relative 16 %   Lymphs Abs 2.4 0.7 - 4.0 K/uL   Monocytes Relative 2 %   Monocytes Absolute 0.3 0.1 - 1.0 K/uL   Eosinophils Relative 0 %   Eosinophils Absolute 0.0 0.0 - 0.5 K/uL   Basophils Relative 1 %   Basophils Absolute 0.1 0.0 - 0.1 K/uL   WBC Morphology DOHLE BODIES     Comment: MILD LEFT SHIFT (1-5% METAS, OCC  MYELO, OCC BANDS) VACUOLATED NEUTROPHILS    Immature Granulocytes 8 %   Abs Immature Granulocytes 1.13 (H) 0.00 - 0.07 K/uL    Comment: Performed at [facility], [place] [address], [place], [place] [id number]  Basic metabolic panel     Status: Abnormal   Collection Time: 09/22/21  4:54 PM  Result Value Ref Range   Sodium 136 135 - 145 mmol/L   Potassium 3.7 3.5 - 5.1 mmol/L   Chloride 103 98 - 111 mmol/L   CO2 22 22 - 32 mmol/L   Glucose, Bld 165 (H) 70 - 99 mg/dL    Comment: Glucose reference range applies only to samples taken after fasting for at least 8 hours.   BUN 18 6 - 20 mg/dL   Creatinine, Ser 0.70 0.44 - 1.00 mg/dL   Calcium 9.3 8.9 - 10.3 mg/dL   GFR, Estimated >60 >60 mL/min    Comment: (NOTE) Calculated using the CKD-EPI Creatinine Equation (2021)    Anion gap 11 5 - 15    Comment: Performed at [facility], [place]  [address], [place], [place] [address]  Brain natriuretic peptide     Status: None   Collection Time: 09/22/21  4:57 PM  Result Value Ref Range   B Natriuretic Peptide 45.0 0.0 - 100.0 pg/mL    Comment: Performed at [facility], [place] [address], [place], [place] [address]  D-dimer, quantitative     Status: Abnormal   Collection Time: 09/22/21  5:00 PM  Result Value Ref Range   D-Dimer, Quant 0.57 (H) 0.00 - 0.50 ug/mL-FEU    Comment: (NOTE) At the manufacturer cut-off value of 0.5 g/mL FEU, this assay has a negative predictive value of 95-100%.This assay is intended for use in conjunction with a clinical pretest probability (PTP) assessment model to exclude pulmonary embolism (PE) and deep venous thrombosis (DVT) in outpatients suspected of PE or DVT. Results should be correlated with clinical presentation. Performed at [facility], [place] [address] [place], [place] [address]   C-reactive protein     Status: Abnormal   Collection Time: 09/22/21  5:08 PM  Result Value Ref Range   CRP 1.7 (H) <1.0 mg/dL    Comment: Performed at [facility], [place] [address] [place], [place] [address]  Blood gas, venous (at [facility], not at [facility])     Status: Abnormal   Collection Time: 09/22/21  5:43 PM  Result Value Ref Range   pH, Ven 7.468 (H) 7.250 - 7.430   pCO2, Ven 35.5 (L) 44.0 - 60.0 mmHg   pO2, Ven <31.0 (LL) 32.0 - 45.0 mmHg    Comment: CRITICAL RESULT CALLED TO, READ BACK BY AND VERIFIED WITH: LEANORD,S RN @1816  ON 09/22/21 JACKSON,K    Bicarbonate 25.4 20.0 - 28.0 mmol/L   Acid-Base Excess 2.3 (H) 0.0 - 2.0 mmol/L   O2 Saturation 41.2 %   Patient temperature 98.6     Comment: Performed at [facility], [place] [address] [place], [place] [address]  Resp Panel by RT-PCR (Flu A&B, Covid) Nasopharyngeal Swab     Status: None   Collection Time: 09/22/21  5:43 PM   Specimen: Nasopharyngeal Swab; Nasopharyngeal(NP)  swabs in vial transport medium  Result Value Ref Range   SARS Coronavirus 2 by RT PCR NEGATIVE NEGATIVE    Comment: (NOTE) SARS-CoV-2 target nucleic acids are NOT DETECTED.  The SARS-CoV-2 RNA is generally detectable in upper respiratory specimens  during the acute phase of infection. The lowest concentration of SARS-CoV-2 viral copies this assay can detect is 138 copies/mL. A negative result does not preclude SARS-Cov-2 infection and should not be used as the sole basis for treatment or other patient management decisions. A negative result may occur with  improper specimen collection/handling, submission of specimen other than nasopharyngeal swab, presence of viral mutation(s) within the areas targeted by this assay, and inadequate number of viral copies(<138 copies/mL). A negative result must be combined with clinical observations, patient history, and epidemiological information. The expected result is Negative.  Fact Sheet for Patients:  BloggerCourse.com  Fact Sheet for Healthcare Providers:  SeriousBroker.it  This test is no t yet approved or cleared by the [place] FDA and  has been authorized for detection and/or diagnosis of SARS-CoV-2 by FDA under an Emergency Use Authorization (EUA). This EUA will remain  in effect (meaning this test can be used) for the duration of the COVID-19 declaration under Section 564(b)(1) of the Act, 21 U.S.C.section 360bbb-3(b)(1), unless the authorization is terminated  or revoked sooner.       Influenza A by PCR NEGATIVE NEGATIVE   Influenza B by PCR NEGATIVE NEGATIVE    Comment: (NOTE) The Xpert Xpress SARS-CoV-2/FLU/RSV plus assay is intended as an aid in the diagnosis of influenza from Nasopharyngeal swab specimens and should not be used as a sole basis for treatment. Nasal washings and aspirates are unacceptable for Xpert Xpress SARS-CoV-2/FLU/RSV testing.  Fact Sheet for  Patients: BloggerCourse.com  Fact Sheet for Healthcare Providers: SeriousBroker.it  This test is not yet approved or cleared by the [place] FDA and has been authorized for detection and/or diagnosis of SARS-CoV-2 by FDA under an Emergency Use Authorization (EUA). This EUA will remain in effect (meaning this test can be used) for the duration of the COVID-19 declaration under Section 564(b)(1) of the Act, 21 U.S.C. section 360bbb-3(b)(1), unless the authorization is terminated or revoked.  Performed at [facility], [address] [place], [place] [address]   Lactic acid, plasma     Status: Abnormal   Collection Time: 09/22/21  7:33 PM  Result Value Ref Range   Lactic Acid, Venous 2.6 (HH) 0.5 - 1.9 mmol/L    Comment: CRITICAL RESULT CALLED TO, READ BACK BY AND VERIFIED WITH: Jac Canavan K @2023  BY BATTLET Performed at [facility], [address], [place], [place]   Protime-INR     Status: None   Collection Time: 09/22/21  7:33 PM  Result Value Ref Range   Prothrombin Time 13.4 11.4 - 15.2 seconds   INR 1.0 0.8 - 1.2    Comment: (NOTE) INR goal varies based on device and disease states. Performed at [facility], [address] [place], [place]   Lactic acid, plasma     Status: None   Collection Time: 09/22/21  9:04 PM  Result Value Ref Range   Lactic Acid, Venous 1.8 0.5 - 1.9 mmol/L    Comment: Performed at [facility], [address] [place], [place]   DG Chest Port 1 View  Result Date: 09/22/2021 CLINICAL DATA:  Productive cough. EXAM: PORTABLE CHEST 1 VIEW COMPARISON:  September 21, 2021 FINDINGS: Cardiomediastinal silhouette is normal. Mediastinal contours appear intact. Low lung volumes. Subtle peribronchial airspace opacities in bilateral lung bases. Osseous structures are without acute abnormality. Soft  tissues are grossly normal. IMPRESSION: Subtle peribronchial airspace opacities in bilateral lung bases may represent  atelectasis or atypical/viral pneumonia. Electronically Signed   By: Fidela Salisbury M.D.   On: 09/22/2021 17:51    Pending Labs Unresulted Labs (From admission, onward)     Start     Ordered   09/22/21 2233  HIV Antibody (routine testing w rflx)  Once,   R        09/22/21 2233   09/22/21 2223  Procalcitonin - Baseline  Add-on,   AD        09/22/21 2224   09/22/21 1857  Blood Culture (routine x 2)  (Septic presentation on arrival (screening labs, nursing and treatment orders for obvious sepsis))  BLOOD CULTURE X 2,   STAT      09/22/21 1859            Vitals/Pain Today's Vitals   09/22/21 2130 09/22/21 2200 09/22/21 2300 09/23/21 0107  BP: (!) 145/93 (!) 144/91 (!) 157/92 (!) 149/90  Pulse: 95 84 82 80  Resp: (!) 23 (!) 21 18 (!) 22  Temp: 98.1 F (36.7 C)     SpO2: 95% 97% 96% 95%  Weight:      Height:      PainSc:        Isolation Precautions No active isolations  Medications Medications  enoxaparin (LOVENOX) injection 40 mg (40 mg Subcutaneous Given 09/22/21 2235)  ondansetron (ZOFRAN) tablet 4 mg (has no administration in time range)    Or  ondansetron (ZOFRAN) injection 4 mg (has no administration in time range)  polyethylene glycol (MIRALAX / GLYCOLAX) packet 17 g (has no administration in time range)  acetaminophen (TYLENOL) tablet 650 mg (has no administration in time range)    Or  acetaminophen (TYLENOL) suppository 650 mg (has no administration in time range)  melatonin tablet 10 mg (has no administration in time range)  pantoprazole (PROTONIX) EC tablet 40 mg (has no administration in time range)  losartan (COZAAR) tablet 50 mg (has no administration in time range)  hydrochlorothiazide (HYDRODIURIL) tablet 12.5 mg (has no administration in time range)  iohexol (OMNIPAQUE) 350 MG/ML injection 75 mL (has no administration in time range)   albuterol (PROVENTIL,VENTOLIN) solution continuous neb (10 mg/hr Nebulization Given 09/22/21 1706)  ipratropium (ATROVENT) nebulizer solution 0.5 mg (0.5 mg Nebulization Given 09/22/21 1705)  methylPREDNISolone sodium succinate (SOLU-MEDROL) 125 mg/2 mL injection 125 mg (125 mg Intravenous Given 09/22/21 1735)  ceFEPIme (MAXIPIME) 2 g in sodium chloride 0.9 % 100 mL IVPB (0 g Intravenous Stopped 09/22/21 2027)  azithromycin (ZITHROMAX) 500 mg in sodium chloride 0.9 % 250 mL IVPB (0 mg Intravenous Stopped 09/22/21 2133)    Mobility walks     Focused Assessments Pulmonary Assessment Handoff:  Lung sounds: Bilateral Breath Sounds: Diminished, Clear O2 Device: Room Air      R Recommendations: See Admitting Provider Note  Report given to:   Additional Notes: ambulates to the BR independently / A&O x4

## 2021-09-23 NOTE — ED Notes (Signed)
Patient transported to CT 

## 2021-09-23 NOTE — Progress Notes (Signed)
PROGRESS NOTE    Lauren Russo  PFX:[id number] DOB: [birth year] DOA: 09/22/2021 PCP: Ileana Ladd, MD   Brief Narrative:  50 year old female with past medical history of pulmonary sarcoidosis, gastroesophageal reflux disease, hypertension, GERD complicated by esophageal stricture status post dilation in the past, history of SVT, anemia, eczema who presents to [facility] emergency department complaints of shortness of breath and cough thought to have concurrent viral illness causing profound respiratory distress.  Patient noted to be hypoxic at intake with exertion down to 85% on room air requiring supplemental oxygen with exertion.    Assessment & Plan:    Acute respiratory distress with acute hypoxia, POA Suspect viral pneumonia Chronic pulmonary sarcoidosis -Ambulating today sats in the 80s, continue supplemental oxygen as required, wean as tolerated -Continue steroids, nebs, supportive care, wean as tolerated -Procalcitonin negative, despite leukocytosis we will hold off on antibiotics likely viral or reactive airway disease at this point -Viral panel, COVID, flu currently negative -Imaging unremarkable for any overt findings of infection, continue to follow clinically, follow cultures taken at admission -D-dimer minimally elevated, CTA chest unremarkable for acute filling defects -No indication for pulmonology consult at this point, follows with Dr. Marchelle Gearing at [facility], consider sideline if her clinical status does not improve in the next 24 to 48 hours  Lactic acidosis, secondary to above -Likely in setting of acute hypoxia, poor p.o. intake -Continue IV fluids as tolerated, increase p.o. intake appropriately  Hypertension, essential Continue home medications   DVT prophylaxis: Lovenox Code Status: Full Family Communication: At bedside  Status is: Inpatient  Dispo: The patient is from: Home              Anticipated d/c is to: Home               Anticipated d/c date is: 48 to 72 hours              Patient currently not medically stable for discharge  Consultants:  None  Procedures:  None  Antimicrobials:  None indicated  Subjective: Continues to be profoundly symptomatic even with minimal exertion, 3-5 word sentences on exam this morning.  Objective: Vitals:   09/23/21 0107 09/23/21 0243 09/23/21 0517 09/23/21 0535  BP: (!) 149/90 (!) 145/100  (!) 145/88  Pulse: 80 78  71  Resp: (!) 22 18  16   Temp:  97.8 F (36.6 C)  99 F (37.2 C)  TempSrc:  Oral  Oral  SpO2: 95% 99% 97% 100%  Weight:      Height:        Intake/Output Summary (Last 24 hours) at 09/23/2021 0725 Last data filed at 09/23/2021 0534 Gross per 24 hour  Intake 100 ml  Output 0 ml  Net 100 ml   Filed Weights   09/22/21 1927  Weight: 86.6 kg    Examination:  General exam: Appears calm and comfortable  Respiratory system: Clear to auscultation. Respiratory effort normal. Cardiovascular system: S1 & S2 heard, RRR. No JVD, murmurs, rubs, gallops or clicks. No pedal edema. Gastrointestinal system: Abdomen is nondistended, soft and nontender. No organomegaly or masses felt. Normal bowel sounds heard. Central nervous system: Alert and oriented. No focal neurological deficits. Extremities: Symmetric 5 x 5 power. Skin: No rashes, lesions or ulcers Psychiatry: Judgement and insight appear normal. Mood & affect appropriate.     Data Reviewed: I have personally reviewed following labs and imaging studies  CBC: Recent Labs  Lab 09/22/21 1654  WBC 14.9*  NEUTROABS 11.0*  HGB 12.8  HCT 38.1  MCV 85.4  PLT 456*   Basic Metabolic Panel: Recent Labs  Lab 09/22/21 1654  NA 136  K 3.7  CL 103  CO2 22  GLUCOSE 165*  BUN 18  CREATININE 0.70  CALCIUM 9.3   GFR: Estimated Creatinine Clearance: 82.3 mL/min (by C-G formula based on SCr of 0.7 mg/dL). Liver Function Tests: No results for input(s): AST, ALT, ALKPHOS, BILITOT, PROT, ALBUMIN in  the last 168 hours. No results for input(s): LIPASE, AMYLASE in the last 168 hours. No results for input(s): AMMONIA in the last 168 hours. Coagulation Profile: Recent Labs  Lab 09/22/21 1933  INR 1.0   Cardiac Enzymes: No results for input(s): CKTOTAL, CKMB, CKMBINDEX, TROPONINI in the last 168 hours. BNP (last 3 results) No results for input(s): PROBNP in the last 8760 hours. HbA1C: No results for input(s): HGBA1C in the last 72 hours. CBG: No results for input(s): GLUCAP in the last 168 hours. Lipid Profile: No results for input(s): CHOL, HDL, LDLCALC, TRIG, CHOLHDL, LDLDIRECT in the last 72 hours. Thyroid Function Tests: No results for input(s): TSH, T4TOTAL, FREET4, T3FREE, THYROIDAB in the last 72 hours. Anemia Panel: No results for input(s): VITAMINB12, FOLATE, FERRITIN, TIBC, IRON, RETICCTPCT in the last 72 hours. Sepsis Labs: Recent Labs  Lab 09/22/21 1700 09/22/21 1933 09/22/21 2104  PROCALCITON <0.10  --   --   LATICACIDVEN  --  2.6* 1.8    Recent Results (from the past 240 hour(s))  Resp Panel by RT-PCR (Flu A&B, Covid) Nasopharyngeal Swab     Status: None   Collection Time: 09/22/21  5:43 PM   Specimen: Nasopharyngeal Swab; Nasopharyngeal(NP) swabs in vial transport medium  Result Value Ref Range Status   SARS Coronavirus 2 by RT PCR NEGATIVE NEGATIVE Final    Comment: (NOTE) SARS-CoV-2 target nucleic acids are NOT DETECTED.  The SARS-CoV-2 RNA is generally detectable in upper respiratory specimens during the acute phase of infection. The lowest concentration of SARS-CoV-2 viral copies this assay can detect is 138 copies/mL. A negative result does not preclude SARS-Cov-2 infection and should not be used as the sole basis for treatment or other patient management decisions. A negative result may occur with  improper specimen collection/handling, submission of specimen other than nasopharyngeal swab, presence of viral mutation(s) within the areas targeted  by this assay, and inadequate number of viral copies(<138 copies/mL). A negative result must be combined with clinical observations, patient history, and epidemiological information. The expected result is Negative.  Fact Sheet for Patients:  BloggerCourse.com  Fact Sheet for Healthcare Providers:  SeriousBroker.it  This test is no t yet approved or cleared by the [place] FDA and  has been authorized for detection and/or diagnosis of SARS-CoV-2 by FDA under an Emergency Use Authorization (EUA). This EUA will remain  in effect (meaning this test can be used) for the duration of the COVID-19 declaration under Section 564(b)(1) of the Act, 21 U.S.C.section 360bbb-3(b)(1), unless the authorization is terminated  or revoked sooner.       Influenza A by PCR NEGATIVE NEGATIVE Final   Influenza B by PCR NEGATIVE NEGATIVE Final    Comment: (NOTE) The Xpert Xpress SARS-CoV-2/FLU/RSV plus assay is intended as an aid in the diagnosis of influenza from Nasopharyngeal swab specimens and should not be used as a sole basis for treatment. Nasal washings and aspirates are unacceptable for Xpert Xpress SARS-CoV-2/FLU/RSV testing.  Fact Sheet for Patients: BloggerCourse.com  Fact Sheet for Healthcare Providers: SeriousBroker.it  This test is not yet approved or cleared by the [place] and has been authorized for detection and/or diagnosis of SARS-CoV-2 by FDA under an Emergency Use Authorization (EUA). This EUA will remain in effect (meaning this test can be used) for the duration of the COVID-19 declaration under Section 564(b)(1) of the Act, 21 U.S.C. section 360bbb-3(b)(1), unless the authorization is terminated or revoked.  Performed at [facility], [address], [place], [place] [address]          Radiology Studies: CT Angio Chest Pulmonary  Embolism (PE) W or WO Contrast  Result Date: 09/23/2021 CLINICAL DATA:  Shortness of breath for 1 week, history of sarcoidosis EXAM: CT ANGIOGRAPHY CHEST WITH CONTRAST TECHNIQUE: Multidetector CT imaging of the chest was performed using the standard protocol during bolus administration of intravenous contrast. Multiplanar CT image reconstructions and MIPs were obtained to evaluate the vascular anatomy. CONTRAST:  71mL OMNIPAQUE IOHEXOL 350 MG/ML SOLN COMPARISON:  03/12/2020 FINDINGS: Cardiovascular: Thoracic aorta and its branches are well visualized. No aneurysmal dilatation or dissection is noted. Heart is at the upper limits of normal in size. Pulmonary artery shows a normal branching pattern. No discrete filling defect is identified to suggest pulmonary embolism. Mediastinum/Nodes: Thoracic inlet is within normal limits. No hilar or mediastinal adenopathy is noted. The esophagus as visualized is within normal limits. Lungs/Pleura: Lungs are well aerated bilaterally. Small patchy areas of scarring are noted bilaterally consistent with the given clinical history. Mild bibasilar atelectatic changes are seen. No sizable effusion is noted. No parenchymal nodules are noted. Upper Abdomen: Visualized upper abdomen is unremarkable. Musculoskeletal: Mild degenerative changes of the thoracic spine are noted. No acute bony abnormality is noted. Review of the MIP images confirms the above findings. IMPRESSION: Stable patchy areas of scarring consistent with the given clinical history of sarcoidosis. No sizable adenopathy is noted. No evidence of pulmonary emboli. Electronically Signed   By: Alcide Clever M.D.   On: 09/23/2021 01:45   DG Chest Port 1 View  Result Date: 09/22/2021 CLINICAL DATA:  Productive cough. EXAM: PORTABLE CHEST 1 VIEW COMPARISON:  September 21, 2021 FINDINGS: Cardiomediastinal silhouette is normal. Mediastinal contours appear intact. Low lung volumes. Subtle peribronchial airspace opacities in  bilateral lung bases. Osseous structures are without acute abnormality. Soft tissues are grossly normal. IMPRESSION: Subtle peribronchial airspace opacities in bilateral lung bases may represent atelectasis or atypical/viral pneumonia. Electronically Signed   By: Ted Mcalpine M.D.   On: 09/22/2021 17:51    Scheduled Meds:  albuterol  2.5 mg Nebulization Q6H   enoxaparin (LOVENOX) injection  40 mg Subcutaneous Q24H   hydrochlorothiazide  12.5 mg Oral Daily   losartan  50 mg Oral Daily   pantoprazole  40 mg Oral Daily   predniSONE  50 mg Oral Q breakfast   Continuous Infusions:   LOS: 0 days   Time spent:  Azucena Fallen, DO [facility]  If 7PM-7AM, please contact night-coverage [email or web address]  09/23/2021, 7:25 AM

## 2021-09-24 DIAGNOSIS — J129 Viral pneumonia, unspecified: Secondary | ICD-10-CM | POA: Diagnosis present

## 2021-09-24 DIAGNOSIS — Z79899 Other long term (current) drug therapy: Secondary | ICD-10-CM | POA: Diagnosis not present

## 2021-09-24 DIAGNOSIS — J9601 Acute respiratory failure with hypoxia: Secondary | ICD-10-CM | POA: Diagnosis present

## 2021-09-24 DIAGNOSIS — I10 Essential (primary) hypertension: Secondary | ICD-10-CM | POA: Diagnosis present

## 2021-09-24 DIAGNOSIS — D649 Anemia, unspecified: Secondary | ICD-10-CM | POA: Diagnosis present

## 2021-09-24 DIAGNOSIS — Z7722 Contact with and (suspected) exposure to environmental tobacco smoke (acute) (chronic): Secondary | ICD-10-CM | POA: Diagnosis present

## 2021-09-24 DIAGNOSIS — Z881 Allergy status to other antibiotic agents status: Secondary | ICD-10-CM | POA: Diagnosis not present

## 2021-09-24 DIAGNOSIS — Z20822 Contact with and (suspected) exposure to covid-19: Secondary | ICD-10-CM | POA: Diagnosis present

## 2021-09-24 DIAGNOSIS — Z9079 Acquired absence of other genital organ(s): Secondary | ICD-10-CM | POA: Diagnosis not present

## 2021-09-24 DIAGNOSIS — R918 Other nonspecific abnormal finding of lung field: Secondary | ICD-10-CM | POA: Diagnosis present

## 2021-09-24 DIAGNOSIS — R0602 Shortness of breath: Secondary | ICD-10-CM | POA: Diagnosis not present

## 2021-09-24 DIAGNOSIS — Z7952 Long term (current) use of systemic steroids: Secondary | ICD-10-CM | POA: Diagnosis not present

## 2021-09-24 DIAGNOSIS — E872 Acidosis, unspecified: Secondary | ICD-10-CM | POA: Diagnosis present

## 2021-09-24 DIAGNOSIS — K219 Gastro-esophageal reflux disease without esophagitis: Secondary | ICD-10-CM | POA: Diagnosis present

## 2021-09-24 DIAGNOSIS — D860 Sarcoidosis of lung: Secondary | ICD-10-CM | POA: Diagnosis present

## 2021-09-24 LAB — GLUCOSE, CAPILLARY: Glucose-Capillary: 89 mg/dL (ref 70–99)

## 2021-09-24 MED ORDER — HYDRALAZINE HCL 20 MG/ML IJ SOLN
5.0000 mg | Freq: Once | INTRAMUSCULAR | Status: AC | PRN
  Administered 2021-09-24: 5 mg via INTRAVENOUS
  Filled 2021-09-24: qty 1

## 2021-09-24 NOTE — Progress Notes (Signed)
Patient called RN into room stating that she felt lightheaded, RN checked patients vitals and they were within normal limits, patients blood glucose was checked and was 89. Patient lying in bed with call light within reach, bed alarm placed on.

## 2021-09-24 NOTE — Progress Notes (Signed)
PROGRESS NOTE    Audrionna Schoepke  KJZ:[id number] DOB: May 22, 1971 DOA: 09/22/2021 PCP: Ileana Ladd, MD   Brief Narrative:  50 year old female with past medical history of pulmonary sarcoidosis, gastroesophageal reflux disease, hypertension, GERD complicated by esophageal stricture status post dilation in the past, history of SVT, anemia, eczema who presents to [facility] emergency department complaints of shortness of breath and cough thought to have concurrent viral illness causing profound respiratory distress.  Patient noted to be hypoxic at intake with exertion down to 85% on room air requiring supplemental oxygen with exertion.    Assessment & Plan:    Acute respiratory distress with acute hypoxia, POA Suspect viral pneumonia Chronic pulmonary sarcoidosis -Continues to have desaturations with minimal exertion over the last 24 hours  -Symptoms at rest slowly improving -Continue steroids, nebs, incentive and flutter, wean oxygen as tolerated -Procalcitonin negative, despite leukocytosis we will hold off on antibiotics likely viral or reactive airway disease at this point -Viral panel, COVID, flu currently negative -Imaging unremarkable for any overt findings of infection, continue to follow clinically, follow cultures taken at admission -D-dimer minimally elevated, CTA chest unremarkable for acute filling defects -No indication for pulmonology consult at this point, follows with Dr. Marchelle Gearing at [place], consider sideline if her clinical status does not improve in the next 24 to 48 hours  Lactic acidosis, secondary to above -Likely in setting of acute hypoxia, poor p.o. intake -Continue IV fluids as tolerated, increase p.o. intake appropriately  Hypertension, essential Continue home medications   DVT prophylaxis: Lovenox Code Status: Full Family Communication: At bedside  Status is: Inpatient  Dispo: The patient is from: Home              Anticipated d/c is  to: Home              Anticipated d/c date is: 48 to 72 hours              Patient currently not medically stable for discharge  Consultants:  None  Procedures:  None  Antimicrobials:  None indicated  Subjective: Continues to be profoundly symptomatic even with minimal exertion, denies nausea vomiting diarrhea constipation headache fevers chills or chest pain  Objective: Vitals:   09/23/21 2010 09/23/21 2223 09/24/21 0150 09/24/21 0545  BP:  (!) 178/111 (!) 168/109 (!) 189/112  Pulse:  77 73 72  Resp:  16 16 16   Temp:  98.8 F (37.1 C) 98.3 F (36.8 C) 98.6 F (37 C)  TempSrc:  Oral Oral Oral  SpO2: 98% 100% 100% 98%  Weight:      Height:        Intake/Output Summary (Last 24 hours) at 09/24/2021 0648 Last data filed at 09/24/2021 0600 Gross per 24 hour  Intake 940 ml  Output --  Net 940 ml    Filed Weights   09/22/21 1927  Weight: 86.6 kg    Examination:  General:  Pleasantly resting in bed, No acute distress. HEENT:  Normocephalic atraumatic.  Sclerae nonicteric, noninjected.  Extraocular movements intact bilaterally. Neck:  Without mass or deformity.  Trachea is midline. Lungs:  Clear to auscultate bilaterally without rhonchi, wheeze, or rales.  Tachypneic into the mid 20s at rest Heart:  Regular rate and rhythm.  Without murmurs, rubs, or gallops. Abdomen:  Soft, nontender, nondistended.  Without guarding or rebound. Extremities: Without cyanosis, clubbing, edema, or obvious deformity. Vascular:  Dorsalis pedis and posterior tibial pulses palpable bilaterally. Skin:  Warm and dry, no erythema,  no ulcerations.  Data Reviewed: I have personally reviewed following labs and imaging studies  CBC: Recent Labs  Lab 09/22/21 1654  WBC 14.9*  NEUTROABS 11.0*  HGB 12.8  HCT 38.1  MCV 85.4  PLT 456*    Basic Metabolic Panel: Recent Labs  Lab 09/22/21 1654  NA 136  K 3.7  CL 103  CO2 22  GLUCOSE 165*  BUN 18  CREATININE 0.70  CALCIUM 9.3     GFR: Estimated Creatinine Clearance: 82.3 mL/min (by C-G formula based on SCr of 0.7 mg/dL). Liver Function Tests: No results for input(s): AST, ALT, ALKPHOS, BILITOT, PROT, ALBUMIN in the last 168 hours. No results for input(s): LIPASE, AMYLASE in the last 168 hours. No results for input(s): AMMONIA in the last 168 hours. Coagulation Profile: Recent Labs  Lab 09/22/21 1933  INR 1.0    Cardiac Enzymes: No results for input(s): CKTOTAL, CKMB, CKMBINDEX, TROPONINI in the last 168 hours. BNP (last 3 results) No results for input(s): PROBNP in the last 8760 hours. HbA1C: No results for input(s): HGBA1C in the last 72 hours. CBG: No results for input(s): GLUCAP in the last 168 hours. Lipid Profile: No results for input(s): CHOL, HDL, LDLCALC, TRIG, CHOLHDL, LDLDIRECT in the last 72 hours. Thyroid Function Tests: No results for input(s): TSH, T4TOTAL, FREET4, T3FREE, THYROIDAB in the last 72 hours. Anemia Panel: No results for input(s): VITAMINB12, FOLATE, FERRITIN, TIBC, IRON, RETICCTPCT in the last 72 hours. Sepsis Labs: Recent Labs  Lab 09/22/21 1700 09/22/21 1933 09/22/21 2104  PROCALCITON <0.10  --   --   LATICACIDVEN  --  2.6* 1.8     Recent Results (from the past 240 hour(s))  Resp Panel by RT-PCR (Flu A&B, Covid) Nasopharyngeal Swab     Status: None   Collection Time: 09/22/21  5:43 PM   Specimen: Nasopharyngeal Swab; Nasopharyngeal(NP) swabs in vial transport medium  Result Value Ref Range Status   SARS Coronavirus 2 by RT PCR NEGATIVE NEGATIVE Final    Comment: (NOTE) SARS-CoV-2 target nucleic acids are NOT DETECTED.  The SARS-CoV-2 RNA is generally detectable in upper respiratory specimens during the acute phase of infection. The lowest concentration of SARS-CoV-2 viral copies this assay can detect is 138 copies/mL. A negative result does not preclude SARS-Cov-2 infection and should not be used as the sole basis for treatment or other patient management  decisions. A negative result may occur with  improper specimen collection/handling, submission of specimen other than nasopharyngeal swab, presence of viral mutation(s) within the areas targeted by this assay, and inadequate number of viral copies(<138 copies/mL). A negative result must be combined with clinical observations, patient history, and epidemiological information. The expected result is Negative.  Fact Sheet for Patients:  BloggerCourse.com  Fact Sheet for Healthcare Providers:  SeriousBroker.it  This test is no t yet approved or cleared by the [place] FDA and  has been authorized for detection and/or diagnosis of SARS-CoV-2 by FDA under an Emergency Use Authorization (EUA). This EUA will remain  in effect (meaning this test can be used) for the duration of the COVID-19 declaration under Section 564(b)(1) of the Act, 21 U.S.C.section 360bbb-3(b)(1), unless the authorization is terminated  or revoked sooner.       Influenza A by PCR NEGATIVE NEGATIVE Final   Influenza B by PCR NEGATIVE NEGATIVE Final    Comment: (NOTE) The Xpert Xpress SARS-CoV-2/FLU/RSV plus assay is intended as an aid in the diagnosis of influenza from Nasopharyngeal swab specimens and should not  be used as a sole basis for treatment. Nasal washings and aspirates are unacceptable for Xpert Xpress SARS-CoV-2/FLU/RSV testing.  Fact Sheet for Patients: BloggerCourse.com  Fact Sheet for Healthcare Providers: SeriousBroker.it  This test is not yet approved or cleared by the [place] FDA and has been authorized for detection and/or diagnosis of SARS-CoV-2 by FDA under an Emergency Use Authorization (EUA). This EUA will remain in effect (meaning this test can be used) for the duration of the COVID-19 declaration under Section 564(b)(1) of the Act, 21 U.S.C. section 360bbb-3(b)(1), unless the  authorization is terminated or revoked.  Performed at [facility], [address] [place], [place] [address]   Blood Culture (routine x 2)     Status: None (Preliminary result)   Collection Time: 09/22/21  7:33 PM   Specimen: BLOOD  Result Value Ref Range Status   Specimen Description   Final    BLOOD RIGHT ANTECUBITAL Performed at [facility], [address] [place], [place] [address]    Special Requests   Final    BOTTLES DRAWN AEROBIC AND ANAEROBIC Blood Culture adequate volume Performed at [facility], [address], [place], [place] [address]    Culture   Final    NO GROWTH < 12 HOURS Performed at [facility], [address] [place], [place] [address]    Report Status PENDING  Incomplete  Blood Culture (routine x 2)     Status: None (Preliminary result)   Collection Time: 09/22/21  7:33 PM   Specimen: BLOOD  Result Value Ref Range Status   Specimen Description   Final    BLOOD LEFT ANTECUBITAL Performed at [facility], [address] [place], [place] [address]    Special Requests   Final    BOTTLES DRAWN AEROBIC AND ANAEROBIC Blood Culture adequate volume Performed at [facility], [address] [place], [place] [address]    Culture   Final    NO GROWTH < 12 HOURS Performed at [facility], [address], [place], [place] [address]    Report Status PENDING  Incomplete  Respiratory (~20 pathogens) panel by PCR     Status: None   Collection Time: 09/23/21  3:48 AM   Specimen: Nasopharyngeal Swab; Respiratory  Result Value Ref Range Status   Adenovirus NOT DETECTED NOT DETECTED Final   Coronavirus 229E NOT DETECTED NOT DETECTED Final    Comment: (NOTE) The Coronavirus on the Respiratory Panel, DOES NOT test for the novel  Coronavirus (2019 nCoV)    Coronavirus HKU1 NOT DETECTED NOT DETECTED Final   Coronavirus NL63 NOT DETECTED NOT DETECTED Final    Coronavirus OC43 NOT DETECTED NOT DETECTED Final   Metapneumovirus NOT DETECTED NOT DETECTED Final   Rhinovirus / Enterovirus NOT DETECTED NOT DETECTED Final   Influenza A NOT DETECTED NOT DETECTED Final   Influenza B NOT DETECTED NOT DETECTED Final   Parainfluenza Virus 1 NOT DETECTED NOT DETECTED Final   Parainfluenza Virus 2 NOT DETECTED NOT DETECTED Final   Parainfluenza Virus 3 NOT DETECTED NOT DETECTED Final   Parainfluenza Virus 4 NOT DETECTED NOT DETECTED Final   Respiratory Syncytial Virus NOT DETECTED NOT DETECTED Final   Bordetella pertussis NOT DETECTED NOT DETECTED Final   Bordetella Parapertussis NOT DETECTED NOT DETECTED Final   Chlamydophila pneumoniae NOT DETECTED NOT DETECTED Final   Mycoplasma pneumoniae NOT DETECTED NOT DETECTED Final    Comment: Performed at [facility], [address] [place], [place] [address]          Radiology Studies: CT Angio Chest Pulmonary Embolism (PE) W or WO Contrast  Result Date: 09/23/2021 CLINICAL DATA:  Shortness of breath for 1 week, history of sarcoidosis EXAM: CT ANGIOGRAPHY CHEST WITH CONTRAST TECHNIQUE: Multidetector CT imaging of the chest was performed using the standard protocol during bolus administration of intravenous contrast. Multiplanar CT image reconstructions and MIPs were obtained to evaluate the vascular anatomy. CONTRAST:  88mL OMNIPAQUE IOHEXOL 350 MG/ML SOLN COMPARISON:  03/12/2020 FINDINGS: Cardiovascular: Thoracic aorta and its branches are well visualized. No aneurysmal dilatation or dissection is noted. Heart is at the upper limits of normal in size. Pulmonary artery shows a normal branching pattern. No discrete filling defect is identified to suggest pulmonary embolism. Mediastinum/Nodes: Thoracic inlet is within normal limits. No hilar or mediastinal adenopathy is noted. The esophagus as visualized is within normal limits. Lungs/Pleura: Lungs are well aerated bilaterally. Small patchy areas of scarring  are noted bilaterally consistent with the given clinical history. Mild bibasilar atelectatic changes are seen. No sizable effusion is noted. No parenchymal nodules are noted. Upper Abdomen: Visualized upper abdomen is unremarkable. Musculoskeletal: Mild degenerative changes of the thoracic spine are noted. No acute bony abnormality is noted. Review of the MIP images confirms the above findings. IMPRESSION: Stable patchy areas of scarring consistent with the given clinical history of sarcoidosis. No sizable adenopathy is noted. No evidence of pulmonary emboli. Electronically Signed   By: Alcide Clever M.D.   On: 09/23/2021 01:45   DG Chest Port 1 View  Result Date: 09/22/2021 CLINICAL DATA:  Productive cough. EXAM: PORTABLE CHEST 1 VIEW COMPARISON:  September 21, 2021 FINDINGS: Cardiomediastinal silhouette is normal. Mediastinal contours appear intact. Low lung volumes. Subtle peribronchial airspace opacities in bilateral lung bases. Osseous structures are without acute abnormality. Soft tissues are grossly normal. IMPRESSION: Subtle peribronchial airspace opacities in bilateral lung bases may represent atelectasis or atypical/viral pneumonia. Electronically Signed   By: Ted Mcalpine M.D.   On: 09/22/2021 17:51    Scheduled Meds:  albuterol  2.5 mg Nebulization Q6H   enoxaparin (LOVENOX) injection  40 mg Subcutaneous Q24H   hydrochlorothiazide  12.5 mg Oral Daily   losartan  50 mg Oral Daily   pantoprazole  40 mg Oral Daily   predniSONE  50 mg Oral Q breakfast   Continuous Infusions:   LOS: 0 days   Time spent:  Azucena Fallen, DO [facility]  If 7PM-7AM, please contact night-coverage [email or web address]  09/24/2021, 6:48 AM

## 2021-09-25 DIAGNOSIS — R0602 Shortness of breath: Secondary | ICD-10-CM | POA: Diagnosis not present

## 2021-09-25 LAB — BASIC METABOLIC PANEL
Anion gap: 8 (ref 5–15)
BUN: 21 mg/dL — ABNORMAL HIGH (ref 6–20)
CO2: 28 mmol/L (ref 22–32)
Calcium: 8.9 mg/dL (ref 8.9–10.3)
Chloride: 103 mmol/L (ref 98–111)
Creatinine, Ser: 0.66 mg/dL (ref 0.44–1.00)
GFR, Estimated: >60 mL/min (ref >60)
Glucose, Bld: 109 mg/dL — ABNORMAL HIGH (ref 70–99)
Potassium: 3.2 mmol/L — ABNORMAL LOW (ref 3.5–5.1)
Sodium: 139 mmol/L (ref 135–145)

## 2021-09-25 LAB — CBC
HCT: 34.4 % — ABNORMAL LOW (ref 36.0–46.0)
Hemoglobin: 11.4 g/dL — ABNORMAL LOW (ref 12.0–15.0)
MCH: 28.9 pg (ref 26.0–34.0)
MCHC: 33.1 g/dL (ref 30.0–36.0)
MCV: 87.3 fL (ref 80.0–100.0)
Platelets: 395 10*3/uL (ref 150–400)
RBC: 3.94 MIL/uL (ref 3.87–5.11)
RDW: 14.5 % (ref 11.5–15.5)
WBC: 22.5 10*3/uL — ABNORMAL HIGH (ref 4.0–10.5)
nRBC: 0.1 % (ref 0.0–0.2)

## 2021-09-25 MED ORDER — ALBUTEROL SULFATE (2.5 MG/3ML) 0.083% IN NEBU
2.5000 mg | INHALATION_SOLUTION | Freq: Four times a day (QID) | RESPIRATORY_TRACT | Status: DC
Start: 2021-09-25 — End: 2021-09-25
  Administered 2021-09-25: 2.5 mg via RESPIRATORY_TRACT
  Filled 2021-09-25: qty 3

## 2021-09-25 MED ORDER — POTASSIUM CHLORIDE CRYS ER 20 MEQ PO TBCR: 40.0000 meq | EXTENDED_RELEASE_TABLET | Freq: Once | ORAL | Status: DC

## 2021-09-25 MED ORDER — ALBUTEROL SULFATE (2.5 MG/3ML) 0.083% IN NEBU: 2.5000 mg | INHALATION_SOLUTION | RESPIRATORY_TRACT | 0 refills | Status: DC | PRN

## 2021-09-25 NOTE — Plan of Care (Signed)

## 2021-09-25 NOTE — Progress Notes (Signed)
Patient ambulated in hallway on room air and O2 saturation stayed at 100% the entire walk.

## 2021-09-25 NOTE — Discharge Summary (Signed)
Physician Discharge Summary  Ruthe Roemer ZOX:[id number] DOB: 10-30-1971 DOA: 09/22/2021  PCP: Ileana Ladd, MD  Admit date: 09/22/2021 Discharge date: 09/25/2021  Admitted From: Home Disposition: Home  Recommendations for Outpatient Follow-up:  Follow up with PCP in 1 week Please obtain BMP/CBC in one week Please follow up on the following pending results: None  Home Health: None Equipment/Devices: None  Discharge Condition: Stable CODE STATUS: Full code Diet recommendation: Regular   Brief/Interim Summary:  Admission HPI written by Shauna Hugh, MD   HPI:     50 year old female with past medical history of pulmonary sarcoidosis, gastroesophageal reflux disease, hypertension, GERD complicated by esophageal stricture status post dilation in the past, history of SVT, anemia, eczema who presents to [facility] emergency department complaints of shortness of breath and cough.   Patient explains that approximately 1 week ago she began to experience shortness of breath.  The shortness of breath is associated with cough that she states is productive of green sputum.  As the days progressed patient's symptoms of shortness of breath became particularly severe, worse with exertion and improved with rest.  This is associated with progressively worsening generalized weakness.  Patient initially had a telemedicine eval with her PCP on Thursday and was put on daily prednisone.     Patient symptoms continued to worsen until she eventually presented to see her primary care provider on 11/5.   during that visit patient was placed on oral Doxycycline therapy but despite this her symptoms continued to worsen and she eventually presented to [facility] emergency department today for evaluation.   Of note, patient denies any associated fever, sick contacts, recent travel or contact with confirmed COVID-19 infection.   Upon evaluation in the emergency department  patient was initially in respiratory distress and was initially placed on supplemental oxygen upon arrival.  Patient was noted to be in respiratory distress and wheezing and therefore was administered intravenous Solu-Medrol as well as several rounds of nebulized bronchodilators.  Chest x-ray revealed peribronchial opacities in the bilateral lung bases and patient was administered doses of intravenous cefepime and azithromycin as well.  Patient was noted to have a mild lactic acidosis of 2.6.  ER provider was concerned about possible sarcoidosis flare with atypical pneumonia.  The hospitalist group was then called to assess the patient for admission to the hospital.   Hospital course:  Acute respiratory failure with hypoxia Secondary to below. CTA chest without evidence for PE. Patient weaned to room air and tolerated ambulation without development of hypoxia. Resolved.  Possible viral pneumonia Unconfirmed. Negative influenza, COVID and respiratory virus panel.  Pulmonary sarcoidosis Low concern for flare but patient was getting better with steroid treatment while inpatient. Continue previously prescribed taper on discharge. Outpatient follow-up with PCP.  Lactic acidosis Secondary to above. Given IV fluids.  Discharge Diagnoses:  Principal Problem:   Shortness of breath Active Problems:   GERD with stricture   Essential hypertension   Lactic acidosis   Pulmonary sarcoidosis (HCC)   Acute respiratory failure with hypoxia [facility])    Discharge Instructions   Allergies as of 09/25/2021       Reactions   Azithromycin    Other reaction(s): rash/ palpitations   Doxycycline    Per pt causes N/V/D        Medication List     STOP taking these medications    amoxicillin 500 MG capsule Commonly known as: AMOXIL   doxycycline 100 MG capsule Commonly known  as: VIBRAMYCIN   ibuprofen 800 MG tablet Commonly known as: ADVIL   methocarbamol 500 MG tablet Commonly known as:  ROBAXIN   potassium chloride SA 20 MEQ tablet Commonly known as: KLOR-CON       TAKE these medications    albuterol 108 (90 Base) MCG/ACT inhaler Commonly known as: VENTOLIN HFA Inhale 1-2 puffs into the lungs every 4 (four) hours as needed for wheezing. What changed: Another medication with the same name was added. Make sure you understand how and when to take each.   albuterol (2.5 MG/3ML) 0.083% nebulizer solution Commonly known as: PROVENTIL Take 3 mLs (2.5 mg total) by nebulization every 4 (four) hours as needed for wheezing or shortness of breath. What changed: You were already taking a medication with the same name, and this prescription was added. Make sure you understand how and when to take each.   losartan-hydrochlorothiazide 50-12.5 MG tablet Commonly known as: HYZAAR Take 1 tablet by mouth daily.   pantoprazole 20 MG tablet Commonly known as: PROTONIX Take 1 tablet (20 mg total) by mouth daily.   predniSONE 10 MG tablet Commonly known as: DELTASONE Take 10 mg by mouth See admin instructions. 12 day taper , Qty of 42 tablets               Durable Medical Equipment  (From admission, onward)           Start     Ordered   09/25/21 1206  For home use only DME Nebulizer machine  Once       Question Answer Comment  Patient needs a nebulizer to treat with the following condition Pulmonary sarcoidosis (HCC)   Length of Need Lifetime      09/25/21 1205            Follow-up Information     Ileana Ladd, MD. Schedule an appointment as soon as possible for a visit in 1 week(s).   Specialty: Family Medicine Why: For hospital follow-up Contact information: [address] [place] [address] [phone]                Allergies  Allergen Reactions   Azithromycin     Other reaction(s): rash/ palpitations   Doxycycline     Per pt causes N/V/D    Consultations: None   Procedures/Studies: CT Angio Chest Pulmonary Embolism  (PE) W or WO Contrast  Result Date: 09/23/2021 CLINICAL DATA:  Shortness of breath for 1 week, history of sarcoidosis EXAM: CT ANGIOGRAPHY CHEST WITH CONTRAST TECHNIQUE: Multidetector CT imaging of the chest was performed using the standard protocol during bolus administration of intravenous contrast. Multiplanar CT image reconstructions and MIPs were obtained to evaluate the vascular anatomy. CONTRAST:  75mL OMNIPAQUE IOHEXOL 350 MG/ML SOLN COMPARISON:  03/12/2020 FINDINGS: Cardiovascular: Thoracic aorta and its branches are well visualized. No aneurysmal dilatation or dissection is noted. Heart is at the upper limits of normal in size. Pulmonary artery shows a normal branching pattern. No discrete filling defect is identified to suggest pulmonary embolism. Mediastinum/Nodes: Thoracic inlet is within normal limits. No hilar or mediastinal adenopathy is noted. The esophagus as visualized is within normal limits. Lungs/Pleura: Lungs are well aerated bilaterally. Small patchy areas of scarring are noted bilaterally consistent with the given clinical history. Mild bibasilar atelectatic changes are seen. No sizable effusion is noted. No parenchymal nodules are noted. Upper Abdomen: Visualized upper abdomen is unremarkable. Musculoskeletal: Mild degenerative changes of the thoracic spine are noted. No acute bony abnormality  is noted. Review of the MIP images confirms the above findings. IMPRESSION: Stable patchy areas of scarring consistent with the given clinical history of sarcoidosis. No sizable adenopathy is noted. No evidence of pulmonary emboli. Electronically Signed   By: Alcide Clever M.D.   On: 09/23/2021 01:45   DG Chest Port 1 View  Result Date: 09/22/2021 CLINICAL DATA:  Productive cough. EXAM: PORTABLE CHEST 1 VIEW COMPARISON:  September 21, 2021 FINDINGS: Cardiomediastinal silhouette is normal. Mediastinal contours appear intact. Low lung volumes. Subtle peribronchial airspace opacities in bilateral lung  bases. Osseous structures are without acute abnormality. Soft tissues are grossly normal. IMPRESSION: Subtle peribronchial airspace opacities in bilateral lung bases may represent atelectasis or atypical/viral pneumonia. Electronically Signed   By: Ted Mcalpine M.D.   On: 09/22/2021 17:51      Subjective: No dyspnea or chest pain today.  Discharge Exam: Vitals:   09/25/21 0529 09/25/21 0936  BP: (!) 145/93   Pulse: 84   Resp: 17   Temp: 98.1 F (36.7 C)   SpO2: 100% 100%   Vitals:   09/24/21 1430 09/24/21 2051 09/25/21 0529 09/25/21 0936  BP:  (!) 144/89 (!) 145/93   Pulse:  83 84   Resp:  18 17   Temp:  98.4 F (36.9 C) 98.1 F (36.7 C)   TempSrc:      SpO2: 93% 100% 100% 100%  Weight:      Height:        General: Pt is alert, awake, not in acute distress Cardiovascular: RRR, S1/S2 +, no rubs, no gallops Respiratory: CTA bilaterally, no wheezing, no rhonchi Abdominal: Soft, NT, ND, bowel sounds + Extremities: no edema, no cyanosis    The results of significant diagnostics from this hospitalization (including imaging, microbiology, ancillary and laboratory) are listed below for reference.     Microbiology: Recent Results (from the past 240 hour(s))  Resp Panel by RT-PCR (Flu A&B, Covid) Nasopharyngeal Swab     Status: None   Collection Time: 09/22/21  5:43 PM   Specimen: Nasopharyngeal Swab; Nasopharyngeal(NP) swabs in vial transport medium  Result Value Ref Range Status   SARS Coronavirus 2 by RT PCR NEGATIVE NEGATIVE Final    Comment: (NOTE) SARS-CoV-2 target nucleic acids are NOT DETECTED.  The SARS-CoV-2 RNA is generally detectable in upper respiratory specimens during the acute phase of infection. The lowest concentration of SARS-CoV-2 viral copies this assay can detect is 138 copies/mL. A negative result does not preclude SARS-Cov-2 infection and should not be used as the sole basis for treatment or other patient management decisions. A negative  result may occur with  improper specimen collection/handling, submission of specimen other than nasopharyngeal swab, presence of viral mutation(s) within the areas targeted by this assay, and inadequate number of viral copies(<138 copies/mL). A negative result must be combined with clinical observations, patient history, and epidemiological information. The expected result is Negative.  Fact Sheet for Patients:  BloggerCourse.com  Fact Sheet for Healthcare Providers:  SeriousBroker.it  This test is no t yet approved or cleared by the [place] FDA and  has been authorized for detection and/or diagnosis of SARS-CoV-2 by FDA under an Emergency Use Authorization (EUA). This EUA will remain  in effect (meaning this test can be used) for the duration of the COVID-19 declaration under Section 564(b)(1) of the Act, 21 U.S.C.section 360bbb-3(b)(1), unless the authorization is terminated  or revoked sooner.       Influenza A by PCR NEGATIVE NEGATIVE Final  Influenza B by PCR NEGATIVE NEGATIVE Final    Comment: (NOTE) The Xpert Xpress SARS-CoV-2/FLU/RSV plus assay is intended as an aid in the diagnosis of influenza from Nasopharyngeal swab specimens and should not be used as a sole basis for treatment. Nasal washings and aspirates are unacceptable for Xpert Xpress SARS-CoV-2/FLU/RSV testing.  Fact Sheet for Patients: BloggerCourse.com  Fact Sheet for Healthcare Providers: SeriousBroker.it  This test is not yet approved or cleared by the [place] FDA and has been authorized for detection and/or diagnosis of SARS-CoV-2 by FDA under an Emergency Use Authorization (EUA). This EUA will remain in effect (meaning this test can be used) for the duration of the COVID-19 declaration under Section 564(b)(1) of the Act, 21 U.S.C. section 360bbb-3(b)(1), unless the authorization is  terminated or revoked.  Performed at [facility], [address], [place], [place] [address]   Blood Culture (routine x 2)     Status: None (Preliminary result)   Collection Time: 09/22/21  7:33 PM   Specimen: BLOOD  Result Value Ref Range Status   Specimen Description   Final    BLOOD RIGHT ANTECUBITAL Performed at [facility], [address] [place], [place] [address]    Special Requests   Final    BOTTLES DRAWN AEROBIC AND ANAEROBIC Blood Culture adequate volume Performed at [facility], [address], [place], [place] [address]    Culture   Final    NO GROWTH 3 DAYS Performed at [facility], [address], [place], [place] [address]    Report Status PENDING  Incomplete  Blood Culture (routine x 2)     Status: None (Preliminary result)   Collection Time: 09/22/21  7:33 PM   Specimen: BLOOD  Result Value Ref Range Status   Specimen Description   Final    BLOOD LEFT ANTECUBITAL Performed at [facility], [address], [place], [place] [address]    Special Requests   Final    BOTTLES DRAWN AEROBIC AND ANAEROBIC Blood Culture adequate volume Performed at [facility], [address] [place], [place] [address]    Culture   Final    NO GROWTH 3 DAYS Performed at [facility], [address] [place], [place] [address]    Report Status PENDING  Incomplete  Respiratory (~20 pathogens) panel by PCR     Status: None   Collection Time: 09/23/21  3:48 AM   Specimen: Nasopharyngeal Swab; Respiratory  Result Value Ref Range Status   Adenovirus NOT DETECTED NOT DETECTED Final   Coronavirus 229E NOT DETECTED NOT DETECTED Final    Comment: (NOTE) The Coronavirus on the Respiratory Panel, DOES NOT test for the novel  Coronavirus (2019 nCoV)    Coronavirus HKU1 NOT DETECTED NOT DETECTED Final   Coronavirus NL63 NOT DETECTED NOT DETECTED Final   Coronavirus OC43 NOT  DETECTED NOT DETECTED Final   Metapneumovirus NOT DETECTED NOT DETECTED Final   Rhinovirus / Enterovirus NOT DETECTED NOT DETECTED Final   Influenza A NOT DETECTED NOT DETECTED Final   Influenza B NOT DETECTED NOT DETECTED Final   Parainfluenza Virus 1 NOT DETECTED NOT DETECTED Final   Parainfluenza Virus 2 NOT DETECTED NOT DETECTED Final   Parainfluenza Virus 3 NOT DETECTED NOT DETECTED Final   Parainfluenza Virus 4 NOT DETECTED NOT DETECTED Final   Respiratory Syncytial Virus NOT DETECTED NOT DETECTED Final   Bordetella pertussis NOT DETECTED NOT DETECTED Final  Bordetella Parapertussis NOT DETECTED NOT DETECTED Final   Chlamydophila pneumoniae NOT DETECTED NOT DETECTED Final   Mycoplasma pneumoniae NOT DETECTED NOT DETECTED Final    Comment: Performed at [facility], [address], [place], [place] [address]     Labs: BNP (last 3 results) Recent Labs    09/22/21 1657  BNP 45.0   Basic Metabolic Panel: Recent Labs  Lab 09/22/21 1654 09/25/21 0431  NA 136 139  K 3.7 3.2*  CL 103 103  CO2 22 28  GLUCOSE 165* 109*  BUN 18 21*  CREATININE 0.70 0.66  CALCIUM 9.3 8.9   Liver Function Tests: No results for input(s): AST, ALT, ALKPHOS, BILITOT, PROT, ALBUMIN in the last 168 hours. No results for input(s): LIPASE, AMYLASE in the last 168 hours. No results for input(s): AMMONIA in the last 168 hours. CBC: Recent Labs  Lab 09/22/21 1654 09/25/21 0431  WBC 14.9* 22.5*  NEUTROABS 11.0*  --   HGB 12.8 11.4*  HCT 38.1 34.4*  MCV 85.4 87.3  PLT 456* 395   Cardiac Enzymes: No results for input(s): CKTOTAL, CKMB, CKMBINDEX, TROPONINI in the last 168 hours. BNP: Invalid input(s): POCBNP CBG: Recent Labs  Lab 09/24/21 0823  GLUCAP 89   D-Dimer Recent Labs    09/22/21 1700  DDIMER 0.57*   Hgb A1c No results for input(s): HGBA1C in the last 72 hours. Lipid Profile No results for input(s): CHOL, HDL, LDLCALC, TRIG, CHOLHDL, LDLDIRECT in the last 72  hours. Thyroid function studies No results for input(s): TSH, T4TOTAL, T3FREE, THYROIDAB in the last 72 hours.  Invalid input(s): FREET3 Anemia work up No results for input(s): VITAMINB12, FOLATE, FERRITIN, TIBC, IRON, RETICCTPCT in the last 72 hours. Urinalysis    Component Value Date/Time   COLORURINE STRAW (A) 07/15/2019 1935   APPEARANCEUR CLEAR 07/15/2019 1935   LABSPEC 1.006 07/15/2019 1935   PHURINE 7.0 07/15/2019 1935   GLUCOSEU NEGATIVE 07/15/2019 1935   HGBUR NEGATIVE 07/15/2019 1935   BILIRUBINUR NEGATIVE 07/15/2019 1935   KETONESUR 5 (A) 07/15/2019 1935   PROTEINUR NEGATIVE 07/15/2019 1935   UROBILINOGEN 0.2 05/20/2015 1700   NITRITE NEGATIVE 07/15/2019 1935   LEUKOCYTESUR NEGATIVE 07/15/2019 1935   Sepsis Labs Invalid input(s): PROCALCITONIN,  WBC,  LACTICIDVEN Microbiology Recent Results (from the past 240 hour(s))  Resp Panel by RT-PCR (Flu A&B, Covid) Nasopharyngeal Swab     Status: None   Collection Time: 09/22/21  5:43 PM   Specimen: Nasopharyngeal Swab; Nasopharyngeal(NP) swabs in vial transport medium  Result Value Ref Range Status   SARS Coronavirus 2 by RT PCR NEGATIVE NEGATIVE Final    Comment: (NOTE) SARS-CoV-2 target nucleic acids are NOT DETECTED.  The SARS-CoV-2 RNA is generally detectable in upper respiratory specimens during the acute phase of infection. The lowest concentration of SARS-CoV-2 viral copies this assay can detect is 138 copies/mL. A negative result does not preclude SARS-Cov-2 infection and should not be used as the sole basis for treatment or other patient management decisions. A negative result may occur with  improper specimen collection/handling, submission of specimen other than nasopharyngeal swab, presence of viral mutation(s) within the areas targeted by this assay, and inadequate number of viral copies(<138 copies/mL). A negative result must be combined with clinical observations, patient history, and  epidemiological information. The expected result is Negative.  Fact Sheet for Patients:  BloggerCourse.com  Fact Sheet for Healthcare Providers:  SeriousBroker.it  This test is no t yet approved or cleared by the [place] FDA  and  has been authorized for detection and/or diagnosis of SARS-CoV-2 by FDA under an Emergency Use Authorization (EUA). This EUA will remain  in effect (meaning this test can be used) for the duration of the COVID-19 declaration under Section 564(b)(1) of the Act, 21 U.S.C.section 360bbb-3(b)(1), unless the authorization is terminated  or revoked sooner.       Influenza A by PCR NEGATIVE NEGATIVE Final   Influenza B by PCR NEGATIVE NEGATIVE Final    Comment: (NOTE) The Xpert Xpress SARS-CoV-2/FLU/RSV plus assay is intended as an aid in the diagnosis of influenza from Nasopharyngeal swab specimens and should not be used as a sole basis for treatment. Nasal washings and aspirates are unacceptable for Xpert Xpress SARS-CoV-2/FLU/RSV testing.  Fact Sheet for Patients: BloggerCourse.com  Fact Sheet for Healthcare Providers: SeriousBroker.it  This test is not yet approved or cleared by the [place] FDA and has been authorized for detection and/or diagnosis of SARS-CoV-2 by FDA under an Emergency Use Authorization (EUA). This EUA will remain in effect (meaning this test can be used) for the duration of the COVID-19 declaration under Section 564(b)(1) of the Act, 21 U.S.C. section 360bbb-3(b)(1), unless the authorization is terminated or revoked.  Performed at [facility], [address] [place], [place] [address]   Blood Culture (routine x 2)     Status: None (Preliminary result)   Collection Time: 09/22/21  7:33 PM   Specimen: BLOOD  Result Value Ref Range Status   Specimen Description   Final    BLOOD RIGHT  ANTECUBITAL Performed at [facility], [address], [place], [place] [address]    Special Requests   Final    BOTTLES DRAWN AEROBIC AND ANAEROBIC Blood Culture adequate volume Performed at [facility], [address], [place], [place] [address]    Culture   Final    NO GROWTH 3 DAYS Performed at [facility], [address], [place], [place] [address]    Report Status PENDING  Incomplete  Blood Culture (routine x 2)     Status: None (Preliminary result)   Collection Time: 09/22/21  7:33 PM   Specimen: BLOOD  Result Value Ref Range Status   Specimen Description   Final    BLOOD LEFT ANTECUBITAL Performed at [facility], [address] [place], [place] [address]    Special Requests   Final    BOTTLES DRAWN AEROBIC AND ANAEROBIC Blood Culture adequate volume Performed at [facility], [address] [place], [place] [address]    Culture   Final    NO GROWTH 3 DAYS Performed at [facility], [address], [place], [place] [address]    Report Status PENDING  Incomplete  Respiratory (~20 pathogens) panel by PCR     Status: None   Collection Time: 09/23/21  3:48 AM   Specimen: Nasopharyngeal Swab; Respiratory  Result Value Ref Range Status   Adenovirus NOT DETECTED NOT DETECTED Final   Coronavirus 229E NOT DETECTED NOT DETECTED Final    Comment: (NOTE) The Coronavirus on the Respiratory Panel, DOES NOT test for the novel  Coronavirus (2019 nCoV)    Coronavirus HKU1 NOT DETECTED NOT DETECTED Final   Coronavirus NL63 NOT DETECTED NOT DETECTED Final   Coronavirus OC43 NOT DETECTED NOT DETECTED Final   Metapneumovirus NOT DETECTED NOT DETECTED Final   Rhinovirus / Enterovirus NOT DETECTED NOT DETECTED Final   Influenza A NOT DETECTED NOT DETECTED  Final   Influenza B NOT DETECTED NOT DETECTED Final   Parainfluenza Virus 1 NOT DETECTED NOT DETECTED Final   Parainfluenza Virus 2 NOT  DETECTED NOT DETECTED Final   Parainfluenza Virus 3 NOT DETECTED NOT DETECTED Final   Parainfluenza Virus 4 NOT DETECTED NOT DETECTED Final   Respiratory Syncytial Virus NOT DETECTED NOT DETECTED Final   Bordetella pertussis NOT DETECTED NOT DETECTED Final   Bordetella Parapertussis NOT DETECTED NOT DETECTED Final   Chlamydophila pneumoniae NOT DETECTED NOT DETECTED Final   Mycoplasma pneumoniae NOT DETECTED NOT DETECTED Final    Comment: Performed at [facility], [address], [place], [place] [address]     Time coordinating discharge: 35 minutes  SIGNED:   Jacquelin Hawking, MD [facility] 09/25/2021, 12:34 PM

## 2021-09-25 NOTE — Discharge Instructions (Signed)
Lauren Russo,  You were in the hospital with trouble breathing and low oxygen levels. This may have been related to a viral infection and/or your sarcoidosis. You have improved with steroids and I recommend you continue steroid therapy per your previously prescribed taper. Please follow-up with your PCP.

## 2021-09-27 LAB — CULTURE, BLOOD (ROUTINE X 2)
Culture: NO GROWTH
Culture: NO GROWTH
Special Requests: ADEQUATE
Special Requests: ADEQUATE

## 2021-12-12 ENCOUNTER — Other Ambulatory Visit: Payer: Self-pay | Admitting: Family Medicine

## 2021-12-12 DIAGNOSIS — Z1231 Encounter for screening mammogram for malignant neoplasm of breast: Secondary | ICD-10-CM

## 2021-12-27 ENCOUNTER — Ambulatory Visit: Payer: Self-pay

## 2022-01-14 ENCOUNTER — Ambulatory Visit (HOSPITAL_BASED_OUTPATIENT_CLINIC_OR_DEPARTMENT_OTHER)
Admission: RE | Admit: 2022-01-14 | Discharge: 2022-01-14 | Disposition: A | Discharge status: Home / Self Care | Payer: PRIVATE HEALTH INSURANCE | Source: Ambulatory Visit | Attending: Family Medicine | Admitting: Family Medicine

## 2022-01-14 ENCOUNTER — Encounter (HOSPITAL_BASED_OUTPATIENT_CLINIC_OR_DEPARTMENT_OTHER): Payer: Self-pay | Admitting: Radiology

## 2022-01-14 ENCOUNTER — Other Ambulatory Visit: Payer: Self-pay

## 2022-01-14 DIAGNOSIS — Z1231 Encounter for screening mammogram for malignant neoplasm of breast: Secondary | ICD-10-CM | POA: Insufficient documentation

## 2022-03-19 ENCOUNTER — Other Ambulatory Visit: Payer: Self-pay | Admitting: Family Medicine

## 2022-03-19 DIAGNOSIS — N644 Mastodynia: Secondary | ICD-10-CM

## 2022-03-31 ENCOUNTER — Ambulatory Visit
Admission: RE | Admit: 2022-03-31 | Discharge: 2022-03-31 | Disposition: A | Discharge status: Home / Self Care | Payer: No Typology Code available for payment source | Source: Ambulatory Visit | Attending: Family Medicine | Admitting: Family Medicine

## 2022-03-31 ENCOUNTER — Ambulatory Visit: Admission: RE | Admit: 2022-03-31 | Payer: PRIVATE HEALTH INSURANCE | Source: Ambulatory Visit

## 2022-03-31 DIAGNOSIS — N644 Mastodynia: Secondary | ICD-10-CM

## 2023-02-09 ENCOUNTER — Other Ambulatory Visit: Payer: Self-pay | Admitting: Family Medicine

## 2023-02-09 DIAGNOSIS — Z1231 Encounter for screening mammogram for malignant neoplasm of breast: Secondary | ICD-10-CM

## 2023-05-01 ENCOUNTER — Ambulatory Visit
Admission: RE | Admit: 2023-05-01 | Discharge: 2023-05-01 | Disposition: A | Discharge status: Home / Self Care | Payer: Commercial Managed Care - HMO | Source: Ambulatory Visit | Attending: Family Medicine

## 2023-05-01 DIAGNOSIS — Z1231 Encounter for screening mammogram for malignant neoplasm of breast: Secondary | ICD-10-CM

## 2023-06-16 ENCOUNTER — Emergency Department (HOSPITAL_COMMUNITY): Payer: Commercial Managed Care - HMO

## 2023-06-16 ENCOUNTER — Inpatient Hospital Stay (HOSPITAL_COMMUNITY)
Admission: EM | Admit: 2023-06-16 | Discharge: 2023-06-23 | DRG: 872 | Disposition: A | Discharge status: Home / Self Care | Payer: Commercial Managed Care - HMO | Attending: Internal Medicine | Admitting: Internal Medicine

## 2023-06-16 ENCOUNTER — Other Ambulatory Visit: Payer: Self-pay

## 2023-06-16 ENCOUNTER — Encounter (HOSPITAL_COMMUNITY): Payer: Self-pay

## 2023-06-16 DIAGNOSIS — Z8249 Family history of ischemic heart disease and other diseases of the circulatory system: Secondary | ICD-10-CM | POA: Diagnosis not present

## 2023-06-16 DIAGNOSIS — Z881 Allergy status to other antibiotic agents status: Secondary | ICD-10-CM | POA: Diagnosis not present

## 2023-06-16 DIAGNOSIS — Z87891 Personal history of nicotine dependence: Secondary | ICD-10-CM

## 2023-06-16 DIAGNOSIS — E668 Other obesity: Secondary | ICD-10-CM | POA: Insufficient documentation

## 2023-06-16 DIAGNOSIS — I5031 Acute diastolic (congestive) heart failure: Secondary | ICD-10-CM | POA: Diagnosis not present

## 2023-06-16 DIAGNOSIS — K219 Gastro-esophageal reflux disease without esophagitis: Secondary | ICD-10-CM | POA: Diagnosis present

## 2023-06-16 DIAGNOSIS — Z9851 Tubal ligation status: Secondary | ICD-10-CM | POA: Diagnosis not present

## 2023-06-16 DIAGNOSIS — D860 Sarcoidosis of lung: Secondary | ICD-10-CM | POA: Diagnosis present

## 2023-06-16 DIAGNOSIS — E872 Acidosis, unspecified: Secondary | ICD-10-CM | POA: Diagnosis present

## 2023-06-16 DIAGNOSIS — R042 Hemoptysis: Secondary | ICD-10-CM | POA: Diagnosis present

## 2023-06-16 DIAGNOSIS — J189 Pneumonia, unspecified organism: Principal | ICD-10-CM

## 2023-06-16 DIAGNOSIS — Z6838 Body mass index (BMI) 38.0-38.9, adult: Secondary | ICD-10-CM | POA: Diagnosis not present

## 2023-06-16 DIAGNOSIS — J329 Chronic sinusitis, unspecified: Secondary | ICD-10-CM | POA: Insufficient documentation

## 2023-06-16 DIAGNOSIS — J918 Pleural effusion in other conditions classified elsewhere: Secondary | ICD-10-CM | POA: Diagnosis present

## 2023-06-16 DIAGNOSIS — G4700 Insomnia, unspecified: Secondary | ICD-10-CM | POA: Diagnosis present

## 2023-06-16 DIAGNOSIS — I10 Essential (primary) hypertension: Secondary | ICD-10-CM | POA: Diagnosis present

## 2023-06-16 DIAGNOSIS — Z1152 Encounter for screening for COVID-19: Secondary | ICD-10-CM

## 2023-06-16 DIAGNOSIS — F419 Anxiety disorder, unspecified: Secondary | ICD-10-CM | POA: Diagnosis present

## 2023-06-16 DIAGNOSIS — A419 Sepsis, unspecified organism: Principal | ICD-10-CM | POA: Diagnosis present

## 2023-06-16 DIAGNOSIS — E876 Hypokalemia: Secondary | ICD-10-CM | POA: Diagnosis present

## 2023-06-16 DIAGNOSIS — E669 Obesity, unspecified: Secondary | ICD-10-CM | POA: Diagnosis present

## 2023-06-16 DIAGNOSIS — Z79899 Other long term (current) drug therapy: Secondary | ICD-10-CM

## 2023-06-16 LAB — COMPREHENSIVE METABOLIC PANEL
ALT: 40 U/L (ref 0–44)
AST: 39 U/L (ref 15–41)
Albumin: 3.4 g/dL — ABNORMAL LOW (ref 3.5–5.0)
Alkaline Phosphatase: 85 U/L (ref 38–126)
Anion gap: 12 (ref 5–15)
BUN: 9 mg/dL (ref 6–20)
CO2: 25 mmol/L (ref 22–32)
Calcium: 8.5 mg/dL — ABNORMAL LOW (ref 8.9–10.3)
Chloride: 95 mmol/L — ABNORMAL LOW (ref 98–111)
Creatinine, Ser: 0.89 mg/dL (ref 0.44–1.00)
GFR, Estimated: >60 mL/min (ref >60)
Glucose, Bld: 167 mg/dL — ABNORMAL HIGH (ref 70–99)
Potassium: 2.4 mmol/L — CL (ref 3.5–5.1)
Sodium: 132 mmol/L — ABNORMAL LOW (ref 135–145)
Total Bilirubin: 0.6 mg/dL (ref 0.3–1.2)
Total Protein: 7.5 g/dL (ref 6.5–8.1)

## 2023-06-16 LAB — I-STAT CG4 LACTIC ACID, ED
Lactic Acid, Venous: 2.1 mmol/L (ref 0.5–1.9)
Lactic Acid, Venous: 3.3 mmol/L (ref 0.5–1.9)

## 2023-06-16 LAB — PROTIME-INR
INR: 1.2 (ref 0.8–1.2)
Prothrombin Time: 15.8 seconds — ABNORMAL HIGH (ref 11.4–15.2)

## 2023-06-16 LAB — CBC WITH DIFFERENTIAL/PLATELET
Abs Immature Granulocytes: 0.11 10*3/uL — ABNORMAL HIGH (ref 0.00–0.07)
Basophils Absolute: 0 10*3/uL (ref 0.0–0.1)
Basophils Relative: 0 %
Eosinophils Absolute: 0 10*3/uL (ref 0.0–0.5)
Eosinophils Relative: 0 %
HCT: 36.4 % (ref 36.0–46.0)
Hemoglobin: 12.1 g/dL (ref 12.0–15.0)
Immature Granulocytes: 1 %
Lymphocytes Relative: 10 %
Lymphs Abs: 1.4 10*3/uL (ref 0.7–4.0)
MCH: 29 pg (ref 26.0–34.0)
MCHC: 33.2 g/dL (ref 30.0–36.0)
MCV: 87.3 fL (ref 80.0–100.0)
Monocytes Absolute: 0.8 10*3/uL (ref 0.1–1.0)
Monocytes Relative: 5 %
Neutro Abs: 12.7 10*3/uL — ABNORMAL HIGH (ref 1.7–7.7)
Neutrophils Relative %: 84 %
Platelets: 260 10*3/uL (ref 150–400)
RBC: 4.17 MIL/uL (ref 3.87–5.11)
RDW: 14.6 % (ref 11.5–15.5)
WBC: 15 10*3/uL — ABNORMAL HIGH (ref 4.0–10.5)
nRBC: 0 % (ref 0.0–0.2)

## 2023-06-16 LAB — URINALYSIS, W/ REFLEX TO CULTURE (INFECTION SUSPECTED)
Bacteria, UA: NONE SEEN
Bilirubin Urine: NEGATIVE
Glucose, UA: NEGATIVE mg/dL
Ketones, ur: 5 mg/dL — AB
Leukocytes,Ua: NEGATIVE
Nitrite: NEGATIVE
Protein, ur: NEGATIVE mg/dL
Specific Gravity, Urine: 1.017 (ref 1.005–1.030)
pH: 6 (ref 5.0–8.0)

## 2023-06-16 LAB — HCG, SERUM, QUALITATIVE: Preg, Serum: NEGATIVE

## 2023-06-16 LAB — RESP PANEL BY RT-PCR (RSV, FLU A&B, COVID)  RVPGX2
Influenza A by PCR: NEGATIVE
Influenza B by PCR: NEGATIVE
Resp Syncytial Virus by PCR: NEGATIVE
SARS Coronavirus 2 by RT PCR: NEGATIVE

## 2023-06-16 LAB — STREP PNEUMONIAE URINARY ANTIGEN: Strep Pneumo Urinary Antigen: NEGATIVE

## 2023-06-16 LAB — LACTIC ACID, PLASMA: Lactic Acid, Venous: 1.5 mmol/L (ref 0.5–1.9)

## 2023-06-16 LAB — APTT: aPTT: 34 seconds (ref 24–36)

## 2023-06-16 MED ORDER — ENOXAPARIN SODIUM 40 MG/0.4ML IJ SOSY
40.0000 mg | PREFILLED_SYRINGE | INTRAMUSCULAR | Status: DC
  Administered 2023-06-16 – 2023-06-22 (×7): 40 mg via SUBCUTANEOUS
  Filled 2023-06-16 (×7): qty 0.4

## 2023-06-16 MED ORDER — SODIUM CHLORIDE 0.9 % IV BOLUS (SEPSIS)
1000.0000 mL | Freq: Once | INTRAVENOUS | Status: AC
  Administered 2023-06-16: 1000 mL via INTRAVENOUS

## 2023-06-16 MED ORDER — MAGNESIUM SULFATE 2 GM/50ML IV SOLN
2.0000 g | Freq: Once | INTRAVENOUS | Status: AC
  Administered 2023-06-16: 2 g via INTRAVENOUS
  Filled 2023-06-16: qty 50

## 2023-06-16 MED ORDER — POTASSIUM CHLORIDE CRYS ER 20 MEQ PO TBCR
40.0000 meq | EXTENDED_RELEASE_TABLET | Freq: Once | ORAL | Status: AC
  Administered 2023-06-16: 40 meq via ORAL
  Filled 2023-06-16: qty 2

## 2023-06-16 MED ORDER — ONDANSETRON 4 MG PO TBDP
4.0000 mg | ORAL_TABLET | Freq: Once | ORAL | Status: AC
Start: 2023-06-16 — End: 2023-06-16
  Administered 2023-06-16: 4 mg via ORAL
  Filled 2023-06-16: qty 1

## 2023-06-16 MED ORDER — POTASSIUM CHLORIDE CRYS ER 20 MEQ PO TBCR
20.0000 meq | EXTENDED_RELEASE_TABLET | Freq: Once | ORAL | Status: AC
  Administered 2023-06-16: 20 meq via ORAL
  Filled 2023-06-16: qty 1

## 2023-06-16 MED ORDER — LACTATED RINGERS IV SOLN: INTRAVENOUS | Status: DC

## 2023-06-16 MED ORDER — ACETAMINOPHEN 325 MG PO TABS
650.0000 mg | ORAL_TABLET | Freq: Four times a day (QID) | ORAL | Status: DC | PRN
  Administered 2023-06-16 – 2023-06-21 (×9): 650 mg via ORAL
  Filled 2023-06-16 (×9): qty 2

## 2023-06-16 MED ORDER — PANTOPRAZOLE SODIUM 40 MG PO TBEC
40.0000 mg | DELAYED_RELEASE_TABLET | Freq: Every day | ORAL | Status: DC
  Administered 2023-06-17 – 2023-06-23 (×7): 40 mg via ORAL
  Filled 2023-06-16 (×7): qty 1

## 2023-06-16 MED ORDER — ACETAMINOPHEN 650 MG RE SUPP: 650.0000 mg | Freq: Four times a day (QID) | RECTAL | Status: DC | PRN

## 2023-06-16 MED ORDER — POTASSIUM CHLORIDE 10 MEQ/100ML IV SOLN
10.0000 meq | INTRAVENOUS | Status: AC
  Administered 2023-06-16 (×3): 10 meq via INTRAVENOUS
  Filled 2023-06-16 (×3): qty 100

## 2023-06-16 MED ORDER — ALBUTEROL SULFATE (2.5 MG/3ML) 0.083% IN NEBU
5.0000 mg | INHALATION_SOLUTION | Freq: Once | RESPIRATORY_TRACT | Status: AC
  Administered 2023-06-16: 5 mg via RESPIRATORY_TRACT
  Filled 2023-06-16: qty 6

## 2023-06-16 MED ORDER — IPRATROPIUM BROMIDE 0.02 % IN SOLN
0.5000 mg | Freq: Once | RESPIRATORY_TRACT | Status: AC
  Administered 2023-06-16: 0.5 mg via RESPIRATORY_TRACT
  Filled 2023-06-16: qty 2.5

## 2023-06-16 MED ORDER — SODIUM CHLORIDE 0.9 % IV SOLN
2.0000 g | INTRAVENOUS | Status: AC
  Administered 2023-06-16 – 2023-06-20 (×5): 2 g via INTRAVENOUS
  Filled 2023-06-16 (×5): qty 20

## 2023-06-16 MED ORDER — HYDROCHLOROTHIAZIDE 12.5 MG PO TABS
12.5000 mg | ORAL_TABLET | Freq: Every day | ORAL | Status: DC
  Administered 2023-06-17 – 2023-06-23 (×7): 12.5 mg via ORAL
  Filled 2023-06-16 (×7): qty 1

## 2023-06-16 MED ORDER — METHYLPREDNISOLONE SODIUM SUCC 125 MG IJ SOLR
125.0000 mg | Freq: Once | INTRAMUSCULAR | Status: AC
  Administered 2023-06-16: 125 mg via INTRAVENOUS
  Filled 2023-06-16: qty 2

## 2023-06-16 MED ORDER — IOHEXOL 350 MG/ML SOLN
80.0000 mL | Freq: Once | INTRAVENOUS | Status: AC | PRN
  Administered 2023-06-16: 75 mL via INTRAVENOUS

## 2023-06-16 MED ORDER — SODIUM CHLORIDE 0.9 % IV SOLN
500.0000 mg | INTRAVENOUS | Status: DC
  Administered 2023-06-17 – 2023-06-19 (×3): 500 mg via INTRAVENOUS
  Filled 2023-06-16 (×3): qty 5

## 2023-06-16 MED ORDER — ONDANSETRON HCL 4 MG PO TABS: 4.0000 mg | ORAL_TABLET | Freq: Four times a day (QID) | ORAL | Status: DC | PRN

## 2023-06-16 MED ORDER — ACETAMINOPHEN 500 MG PO TABS
1000.0000 mg | ORAL_TABLET | Freq: Once | ORAL | Status: AC
  Administered 2023-06-16: 1000 mg via ORAL
  Filled 2023-06-16: qty 2

## 2023-06-16 MED ORDER — LACTATED RINGERS IV BOLUS (SEPSIS)
1000.0000 mL | Freq: Once | INTRAVENOUS | Status: AC
  Administered 2023-06-16: 1000 mL via INTRAVENOUS

## 2023-06-16 MED ORDER — ONDANSETRON HCL 4 MG/2ML IJ SOLN
4.0000 mg | Freq: Four times a day (QID) | INTRAMUSCULAR | Status: DC | PRN
  Administered 2023-06-17: 4 mg via INTRAVENOUS
  Filled 2023-06-16: qty 2

## 2023-06-16 MED ORDER — LOSARTAN POTASSIUM-HCTZ 50-12.5 MG PO TABS: 1.0000 | ORAL_TABLET | Freq: Every day | ORAL | Status: DC

## 2023-06-16 MED ORDER — POTASSIUM CHLORIDE IN NACL 20-0.9 MEQ/L-% IV SOLN
INTRAVENOUS | Status: AC
  Filled 2023-06-16 (×4): qty 1000

## 2023-06-16 MED ORDER — SODIUM CHLORIDE 0.9 % IV SOLN
500.0000 mg | Freq: Once | INTRAVENOUS | Status: AC
  Administered 2023-06-16: 500 mg via INTRAVENOUS
  Filled 2023-06-16: qty 5

## 2023-06-16 MED ORDER — IPRATROPIUM-ALBUTEROL 0.5-2.5 (3) MG/3ML IN SOLN
3.0000 mL | Freq: Four times a day (QID) | RESPIRATORY_TRACT | Status: DC
  Administered 2023-06-16 – 2023-06-17 (×6): 3 mL via RESPIRATORY_TRACT
  Filled 2023-06-16 (×6): qty 3

## 2023-06-16 MED ORDER — LOSARTAN POTASSIUM 50 MG PO TABS
50.0000 mg | ORAL_TABLET | Freq: Every day | ORAL | Status: DC
  Administered 2023-06-16 – 2023-06-23 (×8): 50 mg via ORAL
  Filled 2023-06-16 (×8): qty 1

## 2023-06-16 NOTE — Sepsis Progress Note (Signed)
Elink monitoring for the code sepsis protocol.  

## 2023-06-16 NOTE — ED Triage Notes (Signed)
BIB [facility] from home c/o SOB since Sat with cough, productive green sputum, blood tinged. Desaturation upon ambulation Hx Sarcoidosis

## 2023-06-16 NOTE — Sepsis Progress Note (Signed)
Notified provider and bedside nurse of need to order and draw repeat lactic acid #3.  

## 2023-06-16 NOTE — ED Notes (Signed)
Gave pt a urine cup and explained to her what we needed, pt states " I really haven't coughed up anything since they gave me steroids"

## 2023-06-16 NOTE — H&P (Signed)
History and Physical    Patient: Lauren Russo DOB: March 16, 1971 DOA: 06/16/2023 DOS: the patient was seen and examined on 06/16/2023 PCP: Ileana Ladd, MD (Inactive)  Patient coming from: Home  Chief Complaint:  Chief Complaint  Patient presents with   Shortness of Breath   HPI: Lauren Russo is a 52 y.o. female with medical history significant of  Hypertension, ovarian cyst, pulmonary and sarcoidosis, SVT, tubal pregnancy, GERD who presented to the emergency department with complaints of progressively worse dyspnea since Saturday associated with productive cough of greenish sputum and with mild hemoptysis.  Per triage nursing staff, she desaturated earlier today upon ambulation.  She has also been having fatigue, malaise, myalgia send mild arthralgias.  No sick contacts or travel history.  She denied fever, chills, rhinorrhea. No chest pain, palpitations, diaphoresis, PND, orthopnea or pitting edema of the lower extremities.  No abdominal pain, nausea, emesis, diarrhea, constipation, melena or hematochezia.  No flank pain, dysuria, frequency or hematuria.  No polyuria, polydipsia, polyphagia or blurred vision.   Lab work: Her urine analysis was colorless with small hemoglobin and ketonuria 5 mg/dL.  CBC showed a white count of 15.0, hemoglobin 12.1 g/dL platelets 324.  PT 15.8, INR 1.2 and PTT 34.   Lactic acid is 2.1 then 3.3 mmol/L, but her second measurement was after the patient received the continuous albuterol nebulizer. Coronavirus, influenza and RSV PCR was negative.  CMP showed a sodium 132, potassium 2.4, chloride 95 and CO2 25 mmol/L with a normal anion gap.  Glucose on the 67 mg/dL and albumin 3.4 g/dL.  The rest of the CMP measurements were normal after calcium correction.  Imaging: Portable 1 view chest radiograph showing focal opacity right lung base.  Underinflation.  CTA chest negative for PE.  Right middle lobe pneumonia.  Borderline enlarged  subcarinal node.  ED course: Initial vital signs were temperature 100.2 F, pulse 102, respiration 12, BP 140/89 mmHg O2 sat 100% on room air.  The patient received a continuous 5 mg of albutero and 0.5 mg of ipratropium l neb, KCl 20 mill colons p.o. x 1, KCl 10 mg IVPB x 3, methylprednisolone 125 mg IVP, LR 1000 mL bolus and magnesium sulfate 2 g IVPB.  Review of Systems: As mentioned in the history of present illness. All other systems reviewed and are negative. Past Medical History:  Diagnosis Date   Hypertension    Ovarian cyst    Pulmonary nodule    Sarcoid    SVT (supraventricular tachycardia)    Tubal pregnancy    Past Surgical History:  Procedure Laterality Date   LAPAROSCOPIC SALPINGOOPHERECTOMY     unsure of which side.    TUBAL LIGATION     Social History:  reports that she is a non-smoker but has been exposed to tobacco smoke. She has never used smokeless tobacco. She reports current alcohol use of about 2.0 standard drinks of alcohol per week. She reports that she does not use drugs.  Allergies  Allergen Reactions   Azithromycin     Other reaction(s): rash/ palpitations   Doxycycline     Per pt causes N/V/D    Family History  Problem Relation Age of Onset   Heart disease Mother    Heart disease Father    Breast cancer Maternal Aunt     Prior to Admission medications   Medication Sig Start Date End Date Taking? Authorizing Provider  albuterol (PROVENTIL) (2.5 MG/3ML) 0.083% nebulizer solution Take 3 mLs (2.5  mg total) by nebulization every 4 (four) hours as needed for wheezing or shortness of breath. 09/25/21   Narda Bonds, MD  albuterol (VENTOLIN HFA) 108 (90 Base) MCG/ACT inhaler Inhale 1-2 puffs into the lungs every 4 (four) hours as needed for wheezing. 09/21/21   [provider]  losartan-hydrochlorothiazide (HYZAAR) 50-12.5 MG tablet Take 1 tablet by mouth daily. 07/11/21   [provider]  pantoprazole (PROTONIX) 20 MG tablet Take 1  tablet (20 mg total) by mouth daily. Patient not taking: No sig reported 07/15/19   Caccavale, Sophia, PA-C  predniSONE (DELTASONE) 10 MG tablet Take 10 mg by mouth See admin instructions. 12 day taper , Qty of 42 tablets 09/21/21   [provider]    Physical Exam: Vitals:   06/16/23 1100 06/16/23 1404 06/16/23 1419 06/16/23 1500  BP: (!) 149/89 136/88  119/82  Pulse:  99  81  Resp: (!) 21     Temp:   98.6 F (37 C)   TempSrc:   Oral   SpO2:  96%  95%   Physical Exam Vitals and nursing note reviewed.  Constitutional:      General: She is awake. She is not in acute distress.    Appearance: She is obese. She is not ill-appearing.  HENT:     Head: Normocephalic.     Nose: No rhinorrhea.     Mouth/Throat:     Mouth: Mucous membranes are moist.  Eyes:     General: No scleral icterus.    Pupils: Pupils are equal, round, and reactive to light.  Neck:     Vascular: No JVD.  Cardiovascular:     Rate and Rhythm: Normal rate and regular rhythm.     Heart sounds: S1 normal and S2 normal.  Pulmonary:     Effort: No tachypnea or accessory muscle usage.     Breath sounds: Examination of the right-lower field reveals decreased breath sounds, wheezing and rales. Decreased breath sounds, wheezing, rhonchi and rales present.  Abdominal:     General: Bowel sounds are normal. There is no distension.     Palpations: Abdomen is soft.     Tenderness: There is no abdominal tenderness. There is no guarding.  Musculoskeletal:     Cervical back: Neck supple.     Right lower leg: No edema.     Left lower leg: No edema.  Skin:    General: Skin is warm and dry.  Neurological:     General: No focal deficit present.     Mental Status: She is alert and oriented to person, place, and time.  Psychiatric:        Mood and Affect: Mood normal.        Behavior: Behavior normal. Behavior is cooperative.     Data Reviewed:  Results are pending, will review when available.  Assessment and  Plan: Principal Problem:   CAP (community acquired pneumonia) Superimposed on:   Pulmonary sarcoidosis (HCC) Admit to PCU/inpatient. Continue supplemental oxygen. Scheduled and as needed bronchodilators. Continue ceftriaxone 1 g IVPB daily. Continue azithromycin 500 mg IVPB daily. Check strep pneumoniae urinary antigen. Check sputum Gram stain, culture and sensitivity. Follow-up blood culture and sensitivity. Follow-up CBC and chemistry in the morning. Consider pulmonary evaluation if no improvement.  Active Problems:   Lactic acidosis Patient not hypoxic or hypotensive. I suspect this may be related to albuterol neb. Will continue IV hydration and monitor lactic acid.    GERD with stricture Continue daily pantoprazole.  Essential hypertension Continue losartan 50 mg p.o. daily in AM. Continue hydrochlorothiazide 12.5 mg p.o. in the morning.    Class 2 obesity Current BMI 38.61 kg/m. Lifestyle modifications. Follow-up closely with PCP.    Advance Care Planning:   Code Status: Full Code   Consults:   Family Communication:   Severity of Illness: The appropriate patient status for this patient is INPATIENT. Inpatient status is judged to be reasonable and necessary in order to provide the required intensity of service to ensure the patient's safety. The patient's presenting symptoms, physical exam findings, and initial radiographic and laboratory data in the context of their chronic comorbidities is felt to place them at high risk for further clinical deterioration. Furthermore, it is not anticipated that the patient will be medically stable for discharge from the hospital within 2 midnights of admission.   * I certify that at the point of admission it is my clinical judgment that the patient will require inpatient hospital care spanning beyond 2 midnights from the point of admission due to high intensity of service, high risk for further deterioration and high frequency of  surveillance required.*  Author: Bobette Mo, MD 06/16/2023 3:49 PM  For on call review [email or web address].   This document was prepared using Dragon voice recognition software and may contain some unintended transcription errors.

## 2023-06-16 NOTE — ED Provider Notes (Signed)
[facility]PART[facility] AT [facility] Provider Note   CSN: [id number] Arrival date & time: 06/16/23  1030     History  Chief Complaint  Patient presents with   Shortness of Breath    Lauren Russo is a 52 y.o. female.  Patient is a 52 year old female with a history of hypertension, sarcoidosis, SVT who is presenting today with a 4-day history of worsening URI symptoms.  She reports it started with nasal congestion and a cough that is gradually worsened in the last 36 hours to where now she is running fever and having green phlegm and feeling short of breath with persistent cough.  Since last night she also has started coughing up blood every time she coughs.  She denies a history of ever coughing up blood before denies history of blood clots and does not take any blood thinners.  She has not tried any breathing treatments at home and reports this morning she has also been nauseated and had some vomiting.  She feels short of breath at rest but denies any swelling in her lower extremities.  No nosebleeds.  The history is provided by the patient.  Shortness of Breath      Home Medications Prior to Admission medications   Medication Sig Start Date End Date Taking? Authorizing Provider  albuterol (PROVENTIL) (2.5 MG/3ML) 0.083% nebulizer solution Take 3 mLs (2.5 mg total) by nebulization every 4 (four) hours as needed for wheezing or shortness of breath. 09/25/21   Narda Bonds, MD  albuterol (VENTOLIN HFA) 108 (90 Base) MCG/ACT inhaler Inhale 1-2 puffs into the lungs every 4 (four) hours as needed for wheezing. 09/21/21   [provider]  losartan-hydrochlorothiazide (HYZAAR) 50-12.5 MG tablet Take 1 tablet by mouth daily. 07/11/21   [provider]  pantoprazole (PROTONIX) 20 MG tablet Take 1 tablet (20 mg total) by mouth daily. Patient not taking: No sig reported 07/15/19   Caccavale, Sophia, PA-C  predniSONE (DELTASONE) 10 MG tablet Take 10  mg by mouth See admin instructions. 12 day taper , Qty of 42 tablets 09/21/21   [provider]      Allergies    Azithromycin and Doxycycline    Review of Systems   Review of Systems  Respiratory:  Positive for shortness of breath.     Physical Exam Updated Vital Signs BP (!) 149/89   Pulse (!) 102   Temp 100.2 F (37.9 C) (Oral)   Resp (!) 21   SpO2 100%  Physical Exam Vitals and nursing note reviewed.  Constitutional:      General: She is not in acute distress.    Appearance: She is well-developed. She is ill-appearing.  HENT:     Head: Normocephalic and atraumatic.     Nose:     Comments: No nosebleed    Mouth/Throat:     Mouth: Mucous membranes are dry.  Eyes:     Pupils: Pupils are equal, round, and reactive to light.  Cardiovascular:     Rate and Rhythm: Regular rhythm. Tachycardia present.     Heart sounds: Normal heart sounds. No murmur heard.    No friction rub.  Pulmonary:     Effort: Pulmonary effort is normal. Tachypnea present.     Breath sounds: Decreased breath sounds present. No wheezing or rales.  Abdominal:     General: Bowel sounds are normal. There is no distension.     Palpations: Abdomen is soft.     Tenderness: There is  no abdominal tenderness. There is no guarding or rebound.  Musculoskeletal:        General: No tenderness. Normal range of motion.     Right lower leg: No edema.     Left lower leg: No edema.     Comments: No edema  Skin:    General: Skin is warm and dry.     Findings: No rash.  Neurological:     Mental Status: She is alert and oriented to person, place, and time. Mental status is at baseline.     Cranial Nerves: No cranial nerve deficit.  Psychiatric:        Behavior: Behavior normal.     ED Results / Procedures / Treatments   Labs (all labs ordered are listed, but only abnormal results are displayed) Labs Reviewed  COMPREHENSIVE METABOLIC PANEL - Abnormal; Notable for the following components:       Result Value   Sodium 132 (*)    Potassium 2.4 (*)    Chloride 95 (*)    Glucose, Bld 167 (*)    Calcium 8.5 (*)    Albumin 3.4 (*)    All other components within normal limits  CBC WITH DIFFERENTIAL/PLATELET - Abnormal; Notable for the following components:   WBC 15.0 (*)    Neutro Abs 12.7 (*)    Abs Immature Granulocytes 0.11 (*)    All other components within normal limits  PROTIME-INR - Abnormal; Notable for the following components:   Prothrombin Time 15.8 (*)    All other components within normal limits  I-STAT CG4 LACTIC ACID, ED - Abnormal; Notable for the following components:   Lactic Acid, Venous 2.1 (*)    All other components within normal limits  I-STAT CG4 LACTIC ACID, ED - Abnormal; Notable for the following components:   Lactic Acid, Venous 3.3 (*)    All other components within normal limits  RESP PANEL BY RT-PCR (RSV, FLU A&B, COVID)  RVPGX2  CULTURE, BLOOD (ROUTINE X 2)  CULTURE, BLOOD (ROUTINE X 2)  APTT  HCG, SERUM, QUALITATIVE  URINALYSIS, W/ REFLEX TO CULTURE (INFECTION SUSPECTED)    EKG EKG Interpretation Date/Time:  Tuesday June 16 2023 10:37:58 EDT Ventricular Rate:  100 PR Interval:  148 QRS Duration:  94 QT Interval:  355 QTC Calculation: 458 R Axis:   62  Text Interpretation: Sinus tachycardia Consider right atrial enlargement Nonspecific repol abnormality, diffuse leads No significant change since last tracing Confirmed by Gwyneth Sprout ([id number]) on 06/16/2023 10:41:12 AM  Radiology CT Angio Chest PE W and/or Wo Contrast  Result Date: 06/16/2023 CLINICAL DATA:  Pulmonary embolism (PE) suspected, high prob EXAM: CT ANGIOGRAPHY CHEST WITH CONTRAST TECHNIQUE: Multidetector CT imaging of the chest was performed using the standard protocol during bolus administration of intravenous contrast. Multiplanar CT image reconstructions and MIPs were obtained to evaluate the vascular anatomy. RADIATION DOSE REDUCTION: This exam was performed according  to the departmental dose-optimization program which includes automated exposure control, adjustment of the mA and/or kV according to patient size and/or use of iterative reconstruction technique. CONTRAST:  75mL OMNIPAQUE IOHEXOL 350 MG/ML SOLN COMPARISON:  09/23/2021 FINDINGS: Cardiovascular: SVC patent. Heart size normal. No pericardial effusion. RV is nondilated. Satisfactory opacification of pulmonary arteries noted, and there is no evidence of pulmonary emboli. Adequate contrast opacification of the thoracic aorta with no evidence of dissection, aneurysm, or stenosis. There is bovine variant brachiocephalic arch anatomy without proximal stenosis. Mediastinum/Nodes: Borderline enlarged 1 cm subcarinal node. Subcentimeter right paratracheal and hilar  lymph nodes. Lungs/Pleura: Airspace consolidation in the right middle lobe with air bronchograms, new since previous. Linear scarring or atelectasis laterally and posteriorly in both lower lobes as before. No pneumothorax or effusion. Upper Abdomen: No acute findings. Musculoskeletal: No chest wall abnormality. No acute or significant osseous findings. Review of the MIP images confirms the above findings. IMPRESSION: 1. Negative for acute PE or thoracic aortic dissection. 2. Right middle lobe pneumonia. 3. Borderline enlarged subcarinal node. Electronically Signed   By: Corlis Leak M.D.   On: 06/16/2023 13:00   DG Chest Port 1 View  Result Date: 06/16/2023 CLINICAL DATA:  Sepsis EXAM: PORTABLE CHEST 1 VIEW COMPARISON:  X-ray 09/22/2021 FINDINGS: Underinflation. No pneumothorax or effusion. No edema. Focal opacity right lung base. Subtle infiltrates possible. Normal cardiopericardial silhouette. Overlapping cardiac leads. IMPRESSION: Focal opacity right lung base.  Underinflation.  Recommend follow-up Electronically Signed   By: Karen Kays M.D.   On: 06/16/2023 11:49    Procedures Procedures    Medications Ordered in ED Medications  lactated ringers  infusion ( Intravenous New Bag/Given 06/16/23 1112)  cefTRIAXone (ROCEPHIN) 2 g in sodium chloride 0.9 % 100 mL IVPB (0 g Intravenous Stopped 06/16/23 1303)  potassium chloride 10 mEq in 100 mL IVPB (10 mEq Intravenous New Bag/Given 06/16/23 1246)  lactated ringers bolus 1,000 mL (0 mLs Intravenous Stopped 06/16/23 1305)  ondansetron (ZOFRAN-ODT) disintegrating tablet 4 mg (4 mg Oral Given 06/16/23 1112)  methylPREDNISolone sodium succinate (SOLU-MEDROL) 125 mg/2 mL injection 125 mg (125 mg Intravenous Given 06/16/23 1112)  albuterol (PROVENTIL) (2.5 MG/3ML) 0.083% nebulizer solution 5 mg (5 mg Nebulization Given 06/16/23 1127)  ipratropium (ATROVENT) nebulizer solution 0.5 mg (0.5 mg Nebulization Given 06/16/23 1127)  acetaminophen (TYLENOL) tablet 1,000 mg (1,000 mg Oral Given 06/16/23 1111)  potassium chloride SA (KLOR-CON M) CR tablet 20 mEq (20 mEq Oral Given 06/16/23 1200)  iohexol (OMNIPAQUE) 350 MG/ML injection 80 mL (75 mLs Intravenous Contrast Given 06/16/23 1234)    ED Course/ Medical Decision Making/ A&P                                 Medical Decision Making Amount and/or Complexity of Data Reviewed Labs: ordered. Decision-making details documented in ED Course. Radiology: ordered and independent interpretation performed. Decision-making details documented in ED Course. ECG/medicine tests: ordered and independent interpretation performed. Decision-making details documented in ED Course.  Risk OTC drugs. Prescription drug management.   Pt with multiple medical problems and comorbidities and presenting today with a complaint that caries a high risk for morbidity and mortality.  Here today with symptoms concerning for sepsis versus PE versus exacerbation of her sarcoidosis versus a combination.  Low suspicion for heart failure, pericarditis, pericardial effusion.  Patient with fever, tachypnea, persistent cough and tachycardia.  Sepsis order set initiated.  Patient given albuterol,  Atrovent, Solu-Medrol, Rocephin and IV fluids.  1:19 PM I independently interpreted and reviewed  EKG and labs.  Lactic acid mildly elevated at 2.1, viral panel is negative, CBC with leukocytosis of 15, CMP with hypokalemia today of 2.4 and preserved creatinine.  EKG without acute changes.  I have independently visualized and interpreted pt's images today.  Chest x-ray with concern for pneumonia.  CTA was done to evaluate for PE due to the patient's hemoptysis.  CTA is negative for PE but does show a right middle lobe pneumonia.  On reevaluation patient sats are still maintained on room air but  repeat lactate is higher now at 3.34.  Patient had been given IV Rocephin and after speaking with the patient she reports she does not have an azithromycin allergy and was given IV azithromycin as well.  Will admit the patient for further care.  Findings discussed with the patient and her family member and they are comfortable with this plan.  Continue to replace potassium IV and orally.  CRITICAL CARE Performed by: Shawnta Schlegel Total critical care time: 30 minutes Critical care time was exclusive of separately billable procedures and treating other patients. Critical care was necessary to treat or prevent imminent or life-threatening deterioration. Critical care was time spent personally by me on the following activities: development of treatment plan with patient and/or surrogate as well as nursing, discussions with consultants, evaluation of patient's response to treatment, examination of patient, obtaining history from patient or surrogate, ordering and performing treatments and interventions, ordering and review of laboratory studies, ordering and review of radiographic studies, pulse oximetry and re-evaluation of patient's condition.           Final Clinical Impression(s) / ED Diagnoses Final diagnoses:  Community acquired pneumonia of right middle lobe of lung  Sepsis without acute organ  dysfunction, due to unspecified organism (HCC)  Hypokalemia    Rx / DC Orders ED Discharge Orders     None         Gwyneth Sprout, MD 06/16/23 1320

## 2023-06-17 DIAGNOSIS — A419 Sepsis, unspecified organism: Secondary | ICD-10-CM

## 2023-06-17 DIAGNOSIS — E876 Hypokalemia: Secondary | ICD-10-CM

## 2023-06-17 DIAGNOSIS — J189 Pneumonia, unspecified organism: Secondary | ICD-10-CM | POA: Diagnosis not present

## 2023-06-17 MED ORDER — SODIUM CHLORIDE 0.9% FLUSH: 10.0000 mL | INTRAVENOUS | Status: DC | PRN

## 2023-06-17 MED ORDER — POLYETHYLENE GLYCOL 3350 17 G PO PACK
17.0000 g | PACK | Freq: Every day | ORAL | Status: DC | PRN
  Administered 2023-06-17: 17 g via ORAL
  Filled 2023-06-17: qty 1

## 2023-06-17 MED ORDER — PREDNISONE 20 MG PO TABS
40.0000 mg | ORAL_TABLET | Freq: Every day | ORAL | Status: DC
  Administered 2023-06-17 – 2023-06-20 (×4): 40 mg via ORAL
  Filled 2023-06-17 (×4): qty 2

## 2023-06-17 MED ORDER — SODIUM CHLORIDE 0.9% FLUSH
10.0000 mL | Freq: Two times a day (BID) | INTRAVENOUS | Status: DC
  Administered 2023-06-17 – 2023-06-21 (×3): 10 mL

## 2023-06-17 NOTE — TOC CM/SW Note (Signed)
Transition of Care [facility]) - Inpatient Brief Assessment   Patient Details  Name: Lauren Russo MRN: [id number] Date of Birth: Feb 27, 1971  [facility]) CM/SW Contact:    Howell Rucks, RN Phone Number: 06/17/2023, 10:43 AM   Clinical Narrative: Met with pt at bedside to introduce role of TOC/NCM and review for dc planning. Pt confirmed she has a PCP and pharmacy in place, no home care services or home DME, confirmed family will provide transportation at discharge. TOC Brief Assessment completed. No TOC needs identified.     Transition of Care Asessment: Insurance and Status: Insurance coverage has been reviewed Patient has primary care physician: Yes Home environment has been reviewed: private residence alone with family support Prior level of function:: Independent Prior/Current Home Services: No current home services Social Determinants of Health Reivew: SDOH reviewed no interventions necessary Readmission risk has been reviewed: Yes Transition of care needs: no transition of care needs at this time

## 2023-06-17 NOTE — Plan of Care (Signed)
°  Problem: Activity: °Goal: Ability to tolerate increased activity will improve °Outcome: Progressing °  °Problem: Respiratory: °Goal: Ability to maintain adequate ventilation will improve °Outcome: Progressing °Goal: Ability to maintain a clear airway will improve °Outcome: Progressing °  °

## 2023-06-17 NOTE — Progress Notes (Signed)
[facility]                                                                              Lauren Russo, is a 52 y.o. female, DOB - February 03, 1971, ZOX:[id number] Admit date - 06/16/2023    Outpatient Primary MD for the patient is [name], [name], MD (Inactive)  LOS - 1  days  Chief Complaint  Patient presents with   Shortness of Breath       Brief summary   Patient is a 52 year old female with hypertension, ovarian cyst, pulmonary sarcoidosis, SVT, GERD presented to ED with progressively worse dyspnea for last 3 days.  She also reported productive cough of greenish sputum and mild hemoptysis.  She also desaturated on ambulation.  Reported fatigue, malaise, myalgias otherwise no sick contacts or travel history, no fever chills or rhinorrhea.  Lactic acid 2.1 then 3.3.  Temp of 100.2 F, pulse 102, respiratory rate 12, Chest x-ray showed focal opacity right lung base.  CTA negative for PE, showed right middle lobe pneumonia.  Patient was admitted for further workup.  Assessment & Plan    Principal Problem: Sepsis with CAP (community acquired pneumonia) In the setting of pulmonary sarcoidosis -Patient met sepsis criteria on admission with leukocytosis lactic acidosis, febrile, tachycardia, right middle lobe pneumonia  -CTA chest showed no pulmonary embolism however  right middle lobe pneumonia -Per patient, had nausea and vomiting prior to admission, possible aspiration of the gastric contents -Continue IV antibiotics, overall improving -Received Solu-Medrol 125 mg IV on 7/30.  Will place on p.o. prednisone 40 mg daily to prevent sarcoidosis flare   Active Problems:   Lactic acidosis -Resolved    Pulmonary sarcoidosis (HCC) -Patient used to follow [place] pulmonology, recommended to follow-up outpatient -Currently on p.o. prednisone     GERD with stricture -Continue Protonix     Essential hypertension -BP stable, continue losartan/HCTZ    Class 2  obesity Estimated body mass index is 38.61 kg/m as calculated from the following:   Height as of this encounter: 4\' 11"  (1.499 m).   Weight as of this encounter: 86.7 kg.  Code Status: Full code DVT Prophylaxis:  enoxaparin (LOVENOX) injection 40 mg Start: 06/16/23 2200   Level of Care: Level of care: Progressive Family Communication: Updated patient Disposition Plan:      Remains inpatient appropriate: Hopefully DC home tomorrow if continues to improve   Procedures:  None  Consultants:   None  Antimicrobials:   Anti-infectives (From admission, onward)    Start     Dose/Rate Route Frequency Ordered Stop   06/17/23 1000  azithromycin (ZITHROMAX) 500 mg in sodium chloride 0.9 % 250 mL IVPB        500 mg 250 mL/hr over 60 Minutes Intravenous Every 24 hours 06/16/23 1730     06/16/23 1345  azithromycin (ZITHROMAX) 500 mg in sodium chloride 0.9 % 250 mL IVPB        500 mg 250 mL/hr over 60 Minutes Intravenous  Once 06/16/23 1330 06/16/23 1647   06/16/23 1100  cefTRIAXone (ROCEPHIN) 2 g in sodium chloride 0.9 % 100 mL IVPB  2 g 200 mL/hr over 30 Minutes Intravenous Every 24 hours 06/16/23 1049 06/21/23 1059          Medications  enoxaparin (LOVENOX) injection  40 mg Subcutaneous Q24H   losartan  50 mg Oral Daily   And   hydrochlorothiazide  12.5 mg Oral Daily   ipratropium-albuterol  3 mL Nebulization QID   pantoprazole  40 mg Oral Daily   predniSONE  40 mg Oral Q breakfast   sodium chloride flush  10-40 mL Intracatheter Q12H      Subjective:   Lauren Russo was seen and examined today.  Still having cough but no significant hemoptysis, clear sputum.  No worsening of shortness of breath today.  Patient denies dizziness, chest pain,  abdominal pain, N/V/D/C, new weakness, numbess, tingling. No acute events overnight.    Objective:   Vitals:   06/17/23 0148 06/17/23 0230 06/17/23 0634 06/17/23 0723  BP: 134/86  (!) 139/94   Pulse: 67  60   Resp: 18  18    Temp: 97.8 F (36.6 C)  98.3 F (36.8 C)   TempSrc: Oral  Oral   SpO2: (!) 83% 98% 98% 100%  Weight:      Height:        Intake/Output Summary (Last 24 hours) at 06/17/2023 1316 Last data filed at 06/17/2023 0950 Gross per 24 hour  Intake 2753.16 ml  Output 500 ml  Net 2253.16 ml     Wt Readings from Last 3 Encounters:  06/16/23 86.7 kg  09/22/21 86.6 kg  01/30/20 81.4 kg     Exam General: Alert and oriented x 3, NAD Cardiovascular: S1 S2 auscultated,  RRR Respiratory: Fairly CTAB, no wheezing Gastrointestinal: Soft, nontender, nondistended, + bowel sounds Ext: no pedal edema bilaterally Neuro: Strength 5/5 upper and lower extremities bilaterally Psych: Normal affect     Data Reviewed:  I have personally reviewed following labs    CBC Lab Results  Component Value Date   WBC 17.1 (H) 06/17/2023   RBC 3.78 (L) 06/17/2023   HGB 11.1 (L) 06/17/2023   HCT 33.1 (L) 06/17/2023   MCV 87.6 06/17/2023   MCH 29.4 06/17/2023   PLT 224 06/17/2023   MCHC 33.5 06/17/2023   RDW 14.5 06/17/2023   LYMPHSABS 1.4 06/16/2023   MONOABS 0.8 06/16/2023   EOSABS 0.0 06/16/2023   BASOSABS 0.0 06/16/2023     Last metabolic panel Lab Results  Component Value Date   NA 137 06/17/2023   K 3.4 (L) 06/17/2023   CL 104 06/17/2023   CO2 24 06/17/2023   BUN 10 06/17/2023   CREATININE 0.69 06/17/2023   GLUCOSE 167 (H) 06/17/2023   GFRNONAA >60 06/17/2023   GFRAA >60 07/15/2019   CALCIUM 7.9 (L) 06/17/2023   PROT 6.8 06/17/2023   ALBUMIN 2.9 (L) 06/17/2023   BILITOT 0.6 06/17/2023   ALKPHOS 76 06/17/2023   AST 20 06/17/2023   ALT 32 06/17/2023   ANIONGAP 9 06/17/2023    CBG (last 3)  No results for input(s): "GLUCAP" in the last 72 hours.    Coagulation Profile: Recent Labs  Lab 06/16/23 1040  INR 1.2     Radiology Studies: I have personally reviewed the imaging studies  CT Angio Chest PE W and/or Wo Contrast  Result Date: 06/16/2023 CLINICAL DATA:  Pulmonary  embolism (PE) suspected, high prob EXAM: CT ANGIOGRAPHY CHEST WITH CONTRAST TECHNIQUE: Multidetector CT imaging of the chest was performed using the standard protocol during bolus administration of intravenous contrast.  Multiplanar CT image reconstructions and MIPs were obtained to evaluate the vascular anatomy. RADIATION DOSE REDUCTION: This exam was performed according to the departmental dose-optimization program which includes automated exposure control, adjustment of the mA and/or kV according to patient size and/or use of iterative reconstruction technique. CONTRAST:  75mL OMNIPAQUE IOHEXOL 350 MG/ML SOLN COMPARISON:  09/23/2021 FINDINGS: Cardiovascular: SVC patent. Heart size normal. No pericardial effusion. RV is nondilated. Satisfactory opacification of pulmonary arteries noted, and there is no evidence of pulmonary emboli. Adequate contrast opacification of the thoracic aorta with no evidence of dissection, aneurysm, or stenosis. There is bovine variant brachiocephalic arch anatomy without proximal stenosis. Mediastinum/Nodes: Borderline enlarged 1 cm subcarinal node. Subcentimeter right paratracheal and hilar lymph nodes. Lungs/Pleura: Airspace consolidation in the right middle lobe with air bronchograms, new since previous. Linear scarring or atelectasis laterally and posteriorly in both lower lobes as before. No pneumothorax or effusion. Upper Abdomen: No acute findings. Musculoskeletal: No chest wall abnormality. No acute or significant osseous findings. Review of the MIP images confirms the above findings. IMPRESSION: 1. Negative for acute PE or thoracic aortic dissection. 2. Right middle lobe pneumonia. 3. Borderline enlarged subcarinal node. Electronically Signed   By: Corlis Leak M.D.   On: 06/16/2023 13:00   DG Chest Port 1 View  Result Date: 06/16/2023 CLINICAL DATA:  Sepsis EXAM: PORTABLE CHEST 1 VIEW COMPARISON:  X-ray 09/22/2021 FINDINGS: Underinflation. No pneumothorax or effusion. No  edema. Focal opacity right lung base. Subtle infiltrates possible. Normal cardiopericardial silhouette. Overlapping cardiac leads. IMPRESSION: Focal opacity right lung base.  Underinflation.  Recommend follow-up Electronically Signed   By: Karen Kays M.D.   On: 06/16/2023 11:49       Lauren Russo M.D. [facility] 06/17/2023, 1:16 PM  Available via Epic secure chat 7am-7pm After 7 pm, please refer to night coverage provider listed on amion.

## 2023-06-18 DIAGNOSIS — A419 Sepsis, unspecified organism: Secondary | ICD-10-CM | POA: Diagnosis not present

## 2023-06-18 DIAGNOSIS — E876 Hypokalemia: Secondary | ICD-10-CM | POA: Diagnosis not present

## 2023-06-18 DIAGNOSIS — J189 Pneumonia, unspecified organism: Secondary | ICD-10-CM | POA: Diagnosis not present

## 2023-06-18 LAB — BASIC METABOLIC PANEL
Anion gap: 11 (ref 5–15)
BUN: 16 mg/dL (ref 6–20)
CO2: 22 mmol/L (ref 22–32)
Calcium: 8 mg/dL — ABNORMAL LOW (ref 8.9–10.3)
Chloride: 106 mmol/L (ref 98–111)
Creatinine, Ser: 0.77 mg/dL (ref 0.44–1.00)
GFR, Estimated: >60 mL/min (ref >60)
Glucose, Bld: 127 mg/dL — ABNORMAL HIGH (ref 70–99)
Potassium: 3 mmol/L — ABNORMAL LOW (ref 3.5–5.1)
Sodium: 139 mmol/L (ref 135–145)

## 2023-06-18 LAB — CBC
HCT: 33.8 % — ABNORMAL LOW (ref 36.0–46.0)
Hemoglobin: 10.9 g/dL — ABNORMAL LOW (ref 12.0–15.0)
MCH: 29.1 pg (ref 26.0–34.0)
MCHC: 32.2 g/dL (ref 30.0–36.0)
MCV: 90.4 fL (ref 80.0–100.0)
Platelets: 267 10*3/uL (ref 150–400)
RBC: 3.74 MIL/uL — ABNORMAL LOW (ref 3.87–5.11)
RDW: 14.8 % (ref 11.5–15.5)
WBC: 17.7 10*3/uL — ABNORMAL HIGH (ref 4.0–10.5)
nRBC: 0 % (ref 0.0–0.2)

## 2023-06-18 MED ORDER — MELATONIN 3 MG PO TABS
3.0000 mg | ORAL_TABLET | Freq: Every day | ORAL | Status: DC
  Administered 2023-06-18 – 2023-06-22 (×5): 3 mg via ORAL
  Filled 2023-06-18 (×5): qty 1

## 2023-06-18 MED ORDER — IPRATROPIUM-ALBUTEROL 0.5-2.5 (3) MG/3ML IN SOLN
3.0000 mL | Freq: Three times a day (TID) | RESPIRATORY_TRACT | Status: DC
  Administered 2023-06-18 – 2023-06-19 (×3): 3 mL via RESPIRATORY_TRACT
  Filled 2023-06-18 (×3): qty 3

## 2023-06-18 MED ORDER — GUAIFENESIN ER 600 MG PO TB12
600.0000 mg | ORAL_TABLET | Freq: Two times a day (BID) | ORAL | Status: DC
  Administered 2023-06-18 – 2023-06-23 (×11): 600 mg via ORAL
  Filled 2023-06-18 (×11): qty 1

## 2023-06-18 MED ORDER — LORAZEPAM 1 MG PO TABS
1.0000 mg | ORAL_TABLET | Freq: Three times a day (TID) | ORAL | Status: DC | PRN
  Administered 2023-06-18 – 2023-06-22 (×6): 1 mg via ORAL
  Filled 2023-06-18 (×6): qty 1

## 2023-06-18 MED ORDER — IPRATROPIUM-ALBUTEROL 0.5-2.5 (3) MG/3ML IN SOLN
3.0000 mL | Freq: Two times a day (BID) | RESPIRATORY_TRACT | Status: DC
  Administered 2023-06-18: 3 mL via RESPIRATORY_TRACT
  Filled 2023-06-18: qty 3

## 2023-06-18 MED ORDER — POTASSIUM CHLORIDE CRYS ER 20 MEQ PO TBCR
40.0000 meq | EXTENDED_RELEASE_TABLET | Freq: Once | ORAL | Status: AC
  Administered 2023-06-18: 40 meq via ORAL
  Filled 2023-06-18: qty 2

## 2023-06-18 MED ORDER — GUAIFENESIN-DM 100-10 MG/5ML PO SYRP
5.0000 mL | ORAL_SOLUTION | ORAL | Status: DC | PRN
  Administered 2023-06-18 (×2): 5 mL via ORAL
  Filled 2023-06-18 (×3): qty 10

## 2023-06-18 MED ORDER — BENZONATATE 100 MG PO CAPS
200.0000 mg | ORAL_CAPSULE | Freq: Three times a day (TID) | ORAL | Status: DC
  Administered 2023-06-18 – 2023-06-23 (×16): 200 mg via ORAL
  Filled 2023-06-18 (×16): qty 2

## 2023-06-18 NOTE — Progress Notes (Signed)
[facility]                                                                              Lauren Russo, is a 52 y.o. female, DOB - 1971/08/04, [id number] Admit date - 06/16/2023    Outpatient Primary MD for the patient is [name], [name], MD (Inactive)  LOS - 2  days  Chief Complaint  Patient presents with   Shortness of Breath       Brief summary   Patient is a 52 year old female with hypertension, ovarian cyst, pulmonary sarcoidosis, SVT, GERD presented to ED with progressively worse dyspnea for last 3 days.  She also reported productive cough of greenish sputum and mild hemoptysis.  She also desaturated on ambulation.  Reported fatigue, malaise, myalgias otherwise no sick contacts or travel history, no fever chills or rhinorrhea.  Lactic acid 2.1 then 3.3.  Temp of 100.2 F, pulse 102, respiratory rate 12, Chest x-ray showed focal opacity right lung base.  CTA negative for PE, showed right middle lobe pneumonia.  Patient was admitted for further workup.  Assessment & Plan    Principal Problem: Sepsis with CAP (community acquired pneumonia) In the setting of pulmonary sarcoidosis -Patient met sepsis criteria on admission with leukocytosis lactic acidosis, febrile, tachycardia, right middle lobe pneumonia  -CTA chest showed no pulmonary embolism however  right middle lobe pneumonia -Per patient, had nausea and vomiting prior to admission, possible aspiration of the gastric contents -Continue IV Zithromax, IV Rocephin -Continue prednisone 40 mg daily, DuoNebs, added [employer], continue Robitussin   Active Problems:   Lactic acidosis -Resolved    Pulmonary sarcoidosis (HCC) -Patient used to follow [facility], recommended to follow-up outpatient -Currently on p.o. prednisone  Insomnia -Placed on melatonin    GERD with stricture -Continue Protonix  Anxiety -Did not sleep well last night, tearful today, placed on Ativan as needed for  anxiety    Essential hypertension -BP stable, continue losartan/HCTZ    Class 2 obesity Estimated body mass index is 38.61 kg/m as calculated from the following:   Height as of this encounter: 4\' 11"  (1.499 m).   Weight as of this encounter: 86.7 kg.  Code Status: Full code DVT Prophylaxis:  enoxaparin (LOVENOX) injection 40 mg Start: 06/16/23 2200   Level of Care: Level of care: Progressive Family Communication: Updated patient Disposition Plan:      Remains inpatient appropriate: Hopefully DC home tomorrow if improving   procedures:  None  Consultants:   None  Antimicrobials:   Anti-infectives (From admission, onward)    Start     Dose/Rate Route Frequency Ordered Stop   06/17/23 1000  azithromycin (ZITHROMAX) 500 mg in sodium chloride 0.9 % 250 mL IVPB        500 mg 250 mL/hr over 60 Minutes Intravenous Every 24 hours 06/16/23 1730     06/16/23 1345  azithromycin (ZITHROMAX) 500 mg in sodium chloride 0.9 % 250 mL IVPB        500 mg 250 mL/hr over 60 Minutes Intravenous  Once 06/16/23 1330 06/16/23 1647   06/16/23 1100  cefTRIAXone (ROCEPHIN) 2 g in sodium chloride 0.9 %  100 mL IVPB        2 g 200 mL/hr over 30 Minutes Intravenous Every 24 hours 06/16/23 1049 06/21/23 1059          Medications  benzonatate  200 mg Oral TID   enoxaparin (LOVENOX) injection  40 mg Subcutaneous Q24H   guaiFENesin  600 mg Oral BID   losartan  50 mg Oral Daily   And   hydrochlorothiazide  12.5 mg Oral Daily   ipratropium-albuterol  3 mL Nebulization TID   melatonin  3 mg Oral QHS   pantoprazole  40 mg Oral Daily   predniSONE  40 mg Oral Q breakfast   sodium chloride flush  10-40 mL Intracatheter Q12H      Subjective:   Lauren Russo was seen and examined today.  States had a lot of cough last night, did not sleep well, very anxious and tearful today.  No hemoptysis.  Clear sputum.  Patient denies dizziness, chest pain,  abdominal pain, N/V/D/C.  Objective:   Vitals:    06/17/23 1538 06/17/23 1848 06/17/23 2015 06/18/23 0424  BP:  (!) 142/84 (!) 146/91 (!) 157/90  Pulse:  71 71 (!) 55  Resp:  18 18 16   Temp:  98.5 F (36.9 C) 98.3 F (36.8 C) 98 F (36.7 C)  TempSrc:  Oral Oral Oral  SpO2: 99% 100% 100% 98%  Weight:      Height:        Intake/Output Summary (Last 24 hours) at 06/18/2023 1426 Last data filed at 06/18/2023 0900 Gross per 24 hour  Intake 880.37 ml  Output --  Net 880.37 ml     Wt Readings from Last 3 Encounters:  06/16/23 86.7 kg  09/22/21 86.6 kg  01/30/20 81.4 kg   Physical Exam General: Alert and oriented x 3, NAD Cardiovascular: S1 S2 clear, RRR.  Respiratory: CTAB, no wheezing Gastrointestinal: Soft, nontender, nondistended, NBS Ext: no pedal edema bilaterally Neuro: no new deficits Psych: anxious and tearful   Data Reviewed:  I have personally reviewed following labs    CBC Lab Results  Component Value Date   WBC 17.7 (H) 06/18/2023   RBC 3.74 (L) 06/18/2023   HGB 10.9 (L) 06/18/2023   HCT 33.8 (L) 06/18/2023   MCV 90.4 06/18/2023   MCH 29.1 06/18/2023   PLT 267 06/18/2023   MCHC 32.2 06/18/2023   RDW 14.8 06/18/2023   LYMPHSABS 1.4 06/16/2023   MONOABS 0.8 06/16/2023   EOSABS 0.0 06/16/2023   BASOSABS 0.0 06/16/2023     Last metabolic panel Lab Results  Component Value Date   NA 139 06/18/2023   K 3.0 (L) 06/18/2023   CL 106 06/18/2023   CO2 22 06/18/2023   BUN 16 06/18/2023   CREATININE 0.77 06/18/2023   GLUCOSE 127 (H) 06/18/2023   GFRNONAA >60 06/18/2023   GFRAA >60 07/15/2019   CALCIUM 8.0 (L) 06/18/2023   PROT 6.8 06/17/2023   ALBUMIN 2.9 (L) 06/17/2023   BILITOT 0.6 06/17/2023   ALKPHOS 76 06/17/2023   AST 20 06/17/2023   ALT 32 06/17/2023   ANIONGAP 11 06/18/2023    CBG (last 3)  No results for input(s): "GLUCAP" in the last 72 hours.    Coagulation Profile: Recent Labs  Lab 06/16/23 1040  INR 1.2     Radiology Studies: I have personally reviewed the imaging studies   No results found.     Thad Ranger M.D. [facility] 06/18/2023, 2:26 PM  Available via Epic secure chat  7am-7pm After 7 pm, please refer to night coverage provider listed on amion.

## 2023-06-19 ENCOUNTER — Inpatient Hospital Stay (HOSPITAL_COMMUNITY): Payer: Commercial Managed Care - HMO

## 2023-06-19 DIAGNOSIS — D860 Sarcoidosis of lung: Secondary | ICD-10-CM

## 2023-06-19 DIAGNOSIS — A419 Sepsis, unspecified organism: Secondary | ICD-10-CM | POA: Diagnosis not present

## 2023-06-19 DIAGNOSIS — J189 Pneumonia, unspecified organism: Secondary | ICD-10-CM | POA: Diagnosis not present

## 2023-06-19 DIAGNOSIS — E876 Hypokalemia: Secondary | ICD-10-CM | POA: Diagnosis not present

## 2023-06-19 LAB — RESPIRATORY PANEL BY PCR

## 2023-06-19 MED ORDER — BUDESONIDE 0.5 MG/2ML IN SUSP
0.5000 mg | Freq: Two times a day (BID) | RESPIRATORY_TRACT | Status: DC
  Administered 2023-06-19 – 2023-06-23 (×9): 0.5 mg via RESPIRATORY_TRACT
  Filled 2023-06-19 (×9): qty 2

## 2023-06-19 MED ORDER — AZITHROMYCIN 250 MG PO TABS
500.0000 mg | ORAL_TABLET | Freq: Every day | ORAL | Status: DC
  Administered 2023-06-20 – 2023-06-23 (×4): 500 mg via ORAL
  Filled 2023-06-19 (×4): qty 2

## 2023-06-19 MED ORDER — REVEFENACIN 175 MCG/3ML IN SOLN
175.0000 ug | Freq: Every day | RESPIRATORY_TRACT | Status: DC
  Administered 2023-06-19 – 2023-06-22 (×4): 175 ug via RESPIRATORY_TRACT
  Filled 2023-06-19 (×5): qty 3

## 2023-06-19 MED ORDER — ALBUTEROL SULFATE (2.5 MG/3ML) 0.083% IN NEBU: 2.5000 mg | INHALATION_SOLUTION | RESPIRATORY_TRACT | Status: DC | PRN

## 2023-06-19 MED ORDER — ARFORMOTEROL TARTRATE 15 MCG/2ML IN NEBU
15.0000 ug | INHALATION_SOLUTION | Freq: Two times a day (BID) | RESPIRATORY_TRACT | Status: DC
  Administered 2023-06-19 – 2023-06-23 (×9): 15 ug via RESPIRATORY_TRACT
  Filled 2023-06-19 (×9): qty 2

## 2023-06-19 MED ORDER — POTASSIUM CHLORIDE CRYS ER 20 MEQ PO TBCR
40.0000 meq | EXTENDED_RELEASE_TABLET | Freq: Once | ORAL | Status: AC
  Administered 2023-06-19: 40 meq via ORAL
  Filled 2023-06-19: qty 2

## 2023-06-19 MED ORDER — HYDROCODONE BIT-HOMATROP MBR 5-1.5 MG/5ML PO SOLN
5.0000 mL | ORAL | Status: DC | PRN
  Administered 2023-06-20: 5 mL via ORAL
  Filled 2023-06-19: qty 5

## 2023-06-19 NOTE — Plan of Care (Signed)
  Problem: Activity: Goal: Ability to tolerate increased activity will improve Outcome: Progressing   Problem: Clinical Measurements: Goal: Ability to maintain a body temperature in the normal range will improve Outcome: Progressing   Problem: Respiratory: Goal: Ability to maintain adequate ventilation will improve Outcome: Not Progressing

## 2023-06-19 NOTE — Plan of Care (Signed)
  Problem: Activity: Goal: Ability to tolerate increased activity will improve Outcome: Progressing   Problem: Clinical Measurements: Goal: Ability to maintain a body temperature in the normal range will improve Outcome: Progressing   Problem: Respiratory: Goal: Ability to maintain adequate ventilation will improve Outcome: Progressing Goal: Ability to maintain a clear airway will improve Outcome: Progressing   

## 2023-06-19 NOTE — Plan of Care (Signed)
  Problem: Activity: Goal: Ability to tolerate increased activity will improve Outcome: Not Progressing   Problem: Clinical Measurements: Goal: Ability to maintain a body temperature in the normal range will improve Outcome: Not Progressing   

## 2023-06-19 NOTE — Progress Notes (Signed)
[facility]                                                                              Lauren Russo, is a 52 y.o. female, DOB - 1971-05-05, [id number] Admit date - 06/16/2023    Outpatient Primary MD for the patient is Lauren Russo, Lauren Morrow, MD (Inactive)  LOS - 3  days  Chief Complaint  Patient presents with   Shortness of Breath       Brief summary   Patient is a 52 year old female with hypertension, ovarian cyst, pulmonary sarcoidosis, SVT, GERD presented to ED with progressively worse dyspnea for last 3 days.  She also reported productive cough of greenish sputum and mild hemoptysis.  She also desaturated on ambulation.  Reported fatigue, malaise, myalgias otherwise no sick contacts or travel history, no fever chills or rhinorrhea.  Lactic acid 2.1 then 3.3.  Temp of 100.2 F, pulse 102, respiratory rate 12, Chest x-ray showed focal opacity right lung base.  CTA negative for PE, showed right middle lobe pneumonia.  Patient was admitted for further workup.  Assessment & Plan    Principal Problem: Sepsis with CAP (community acquired pneumonia) In the setting of pulmonary sarcoidosis -Patient met sepsis criteria on admission with leukocytosis lactic acidosis, febrile, tachycardia, right middle lobe pneumonia  -CTA chest showed no pulmonary embolism however  right middle lobe pneumonia -Feeling a lot worse today, states difficulty catching breath, intermittent streaked sputum, had worsened cough last night -Pulmonology consulted, appreciate recommendations -Continue prednisone, placed on Yupelri, Brovana, Pulmicort with as needed albuterol -Continue PPI, Hycodan, I-S, Mucinex -Continue IV Rocephin, Zithromax  Active Problems:   Lactic acidosis -Resolved    Pulmonary sarcoidosis (HCC) -Patient used to follow [facility] pulmonology, recommended to follow-up outpatient -Started on prednisone, pulmonology consulted  Insomnia -Placed on melatonin    GERD with  stricture -Continue Protonix  Anxiety -Did not sleep well last night, tearful today, placed on Ativan as needed for anxiety    Essential hypertension -BP stable, continue losartan/HCTZ    Class 2 obesity Estimated body mass index is 38.61 kg/m as calculated from the following:   Height as of this encounter: 4\' 11"  (1.499 m).   Weight as of this encounter: 86.7 kg.  Code Status: Full code DVT Prophylaxis:  enoxaparin (LOVENOX) injection 40 mg Start: 06/16/23 2200   Level of Care: Level of care: Progressive Family Communication: Updated patient Disposition Plan:      Remains inpatient appropriate:    procedures:  None  Consultants:   Pulm   Antimicrobials:   Anti-infectives (From admission, onward)    Start     Dose/Rate Route Frequency Ordered Stop   06/20/23 1000  azithromycin (ZITHROMAX) tablet 500 mg        500 mg Oral Daily 06/19/23 1506     06/17/23 1000  azithromycin (ZITHROMAX) 500 mg in sodium chloride 0.9 % 250 mL IVPB  Status:  Discontinued        500 mg 250 mL/hr over 60 Minutes Intravenous Every 24 hours 06/16/23 1730 06/19/23 1505   06/16/23 1345  azithromycin (ZITHROMAX) 500 mg in sodium chloride 0.9 % 250 mL  IVPB        500 mg 250 mL/hr over 60 Minutes Intravenous  Once 06/16/23 1330 06/16/23 1647   06/16/23 1100  cefTRIAXone (ROCEPHIN) 2 g in sodium chloride 0.9 % 100 mL IVPB        2 g 200 mL/hr over 30 Minutes Intravenous Every 24 hours 06/16/23 1049 06/21/23 1059          Medications  arformoterol  15 mcg Nebulization BID   [START ON 06/20/2023] azithromycin  500 mg Oral Daily   benzonatate  200 mg Oral TID   budesonide (PULMICORT) nebulizer solution  0.5 mg Nebulization BID   enoxaparin (LOVENOX) injection  40 mg Subcutaneous Q24H   guaiFENesin  600 mg Oral BID   losartan  50 mg Oral Daily   And   hydrochlorothiazide  12.5 mg Oral Daily   melatonin  3 mg Oral QHS   pantoprazole  40 mg Oral Daily   predniSONE  40 mg Oral Q breakfast    revefenacin  175 mcg Nebulization Daily   sodium chloride flush  10-40 mL Intracatheter Q12H      Subjective:   Lauren Russo was seen and examined today.  Feeling worse, tearful, states had significant cough last night with blood-streaked sputum.  Did not sleep, no chest pain but having difficulty catching breath.  No fevers  Objective:   Vitals:   06/18/23 2037 06/18/23 2128 06/19/23 0502 06/19/23 1336  BP:  122/73 (!) 157/99 124/83  Pulse:  85 61 71  Resp:  20 16   Temp:  98.7 F (37.1 C) 98.1 F (36.7 C) 98.3 F (36.8 C)  TempSrc:  Oral Oral Oral  SpO2: 94% 98% 100% 95%  Weight:      Height:        Intake/Output Summary (Last 24 hours) at 06/19/2023 1521 Last data filed at 06/18/2023 2200 Gross per 24 hour  Intake 240 ml  Output --  Net 240 ml     Wt Readings from Last 3 Encounters:  06/16/23 86.7 kg  09/22/21 86.6 kg  01/30/20 81.4 kg   Physical Exam General: Alert and oriented x 3, in mild distress, difficulty completing sentences due to dyspnea Cardiovascular: S1 S2 clear, RRR.  Respiratory: Scattered wheezing bilaterally Gastrointestinal: Soft, nontender, nondistended, NBS Ext: no pedal edema bilaterally Neuro: no new deficits Psych: anxious    Data Reviewed:  I have personally reviewed following labs    CBC Lab Results  Component Value Date   WBC 17.7 (H) 06/18/2023   RBC 3.74 (L) 06/18/2023   HGB 10.9 (L) 06/18/2023   HCT 33.8 (L) 06/18/2023   MCV 90.4 06/18/2023   MCH 29.1 06/18/2023   PLT 267 06/18/2023   MCHC 32.2 06/18/2023   RDW 14.8 06/18/2023   LYMPHSABS 1.4 06/16/2023   MONOABS 0.8 06/16/2023   EOSABS 0.0 06/16/2023   BASOSABS 0.0 06/16/2023     Last metabolic panel Lab Results  Component Value Date   NA 138 06/19/2023   K 3.4 (L) 06/19/2023   CL 106 06/19/2023   CO2 24 06/19/2023   BUN 16 06/19/2023   CREATININE 0.75 06/19/2023   GLUCOSE 105 (H) 06/19/2023   GFRNONAA >60 06/19/2023   GFRAA >60 07/15/2019   CALCIUM 8.0  (L) 06/19/2023   PROT 6.8 06/17/2023   ALBUMIN 2.9 (L) 06/17/2023   BILITOT 0.6 06/17/2023   ALKPHOS 76 06/17/2023   AST 20 06/17/2023   ALT 32 06/17/2023   ANIONGAP 8 06/19/2023  CBG (last 3)  No results for input(s): "GLUCAP" in the last 72 hours.    Coagulation Profile: Recent Labs  Lab 06/16/23 1040  INR 1.2     Radiology Studies: I have personally reviewed the imaging studies  DG Chest 2 View  Result Date: 06/19/2023 CLINICAL DATA:  Productive cough.  Community-acquired pneumonia. EXAM: CHEST - 2 VIEW COMPARISON:  06/16/2023 FINDINGS: Right base collapse/consolidation with small right pleural effusion again noted. Left lung clear. Cardiopericardial silhouette is at upper limits of normal for size. No acute bony abnormality. IMPRESSION: Persistent right base collapse/consolidation with small right pleural effusion. Electronically Signed   By: Kennith Center M.D.   On: 06/19/2023 13:01         M.D. [facility] 06/19/2023, 3:21 PM  Available via Epic secure chat 7am-7pm After 7 pm, please refer to night coverage provider listed on amion.

## 2023-06-19 NOTE — Consult Note (Signed)
NAME:  Lauren Russo, MRN:  [id number], DOB:  1971-03-13, LOS: 3 ADMISSION DATE:  06/16/2023, CONSULTATION DATE:  06/19/23 REFERRING MD:  Dr. Isidoro Donning, CHIEF COMPLAINT:  SOB   History of Present Illness:   52 year old female with PMH significant for pulmonary sarcoidosis, former smoker (quit 2013), HTN, and GERD who presented with worsening SOB and cough.  Reports was in her normal state of health prior developing chills on Saturday which was followed by progressive cough and shortness of breath.  Cough productive with intermittent bright red blood streaked, yellow and at times clear.  Some wheezing but otherwise denied any sinus congestion or drainage, reflux symptoms, or LE swelling.  She is not on blood thinners.  She runs a daycare center.   Pt previously followed in our office by Dr. Marchelle Gearing, last seen in 01/2020, since lost to follow-up.  Initial diagnosis in 2013 by biopsy with low ACE levels.  PFTs 2014 w/ FVC 96%, DLCO 68% corrects to 100% for Va.  Last required hospitalization with steroid use in 2022, otherwise has been managing well and follows with her PCP.   Initially reported by triage to have some desaturation on ambulation, but since has not required supplemental O2.   WBC 15, SARS, flu, and RSV negative, urine strep negative, CXR showed focal opacity at right base, and CTA PE revealed a dense RML pna, negative for PE, and showed borderline enlarged 1 cm subcarinal node.  She was admitted to [facility] with sepsis secondary to CAP, placed on solumedrol since converted to oral prednisone, bronchodilators, and antibiotic coverage. Reports was feeling better but this morning she went to the bathroom and on the way back had a coughing spell, was unable to catch her breath, and continues to feel bad.  Cough remains intermittently blood streaked vs yellow vs clear.  Reports symptom improvement with nebulizers.  Remains afebrile and on room air.  Pulmonary consulted for further recommendations and  concern for sarcoid flare.    Pertinent  Medical History  Former smoker, Pulmonary sarcoidosis, HTN, SVT, GERD, ovarian cyst  Significant Hospital Events: Including procedures, antibiotic start and stop dates in addition to other pertinent events   7/30 admitted to [facility]  Interim History / Subjective:   Objective   Blood pressure (!) 157/99, pulse 61, temperature 98.1 F (36.7 C), temperature source Oral, resp. rate 16, height 4\' 11"  (1.499 m), weight 86.7 kg, SpO2 100%.        Intake/Output Summary (Last 24 hours) at 06/19/2023 1025 Last data filed at 06/18/2023 2200 Gross per 24 hour  Intake 580 ml  Output --  Net 580 ml   Filed Weights   06/16/23 1722  Weight: 86.7 kg   Examination: General:  pleasant adult female sitting in bed in NAD Neuro: Alert, oriented, appropriate, MAE PULM:  mostly non-labored, some conversational dyspnea and cough, defer further assessment to Dr. Francine Graven  [facility] Problem list    Assessment & Plan:   Community acquired pneumonia of RML -  with reported hemoptysis, mild/ intermittent streaked/ blood tinged sputum Hx pulmonary sarcoidosis - CT reviewed without concerns for active or progressive disease process for sarcoidosis.  One borderline enlarged subcarinal node likely reactive  - CTA PE neg for PE - urine strep neg, SARS/ Flu/ RSV neg P:  - reassuring that pt not requiring supplemental O2 - cont CAP coverage w/ azithro and ceftriaxone  - check RVP given that she runs a daycare, r/o underlying viral process with secondary bacterial  process  - pending CXR today - H/H remains stable, blood streaked sputum likely 2/2 dense PNA - cont prednisone 40mg  daily for now - change BD to yupelri, brovanna, pulmicort with prn albuterol - hycodan for cough suppression - cont pulmonary hygiene with IS, mucinex, mobilize  - cont PPI - will need follow-up with pulmonologist> either with our group or different practice with repeat CT in 2-3 months  to ensure clearing of process.   Pulmonary will see again 8/3.    Best Practice (right click and "Reselect all SmartList Selections" daily)  Per primary team  Labs   CBC: Recent Labs  Lab 06/16/23 1040 06/17/23 0418 06/18/23 0849  WBC 15.0* 17.1* 17.7*  NEUTROABS 12.7*  --   --   HGB 12.1 11.1* 10.9*  HCT 36.4 33.1* 33.8*  MCV 87.3 87.6 90.4  PLT 260 224 267    Basic Metabolic Panel: Recent Labs  Lab 06/16/23 1040 06/17/23 0418 06/18/23 0849 06/19/23 0400  NA 132* 137 139 138  K 2.4* 3.4* 3.0* 3.4*  CL 95* 104 106 106  CO2 25 24 22 24   GLUCOSE 167* 167* 127* 105*  BUN 9 10 16 16   CREATININE 0.89 0.69 0.77 0.75  CALCIUM 8.5* 7.9* 8.0* 8.0*  MG  --   --   --  2.2   GFR: Estimated Creatinine Clearance: 80.5 mL/min (by C-G formula based on SCr of 0.75 mg/dL). Recent Labs  Lab 06/16/23 1040 06/16/23 1100 06/16/23 1315 06/16/23 1748 06/17/23 0418 06/18/23 0849  WBC 15.0*  --   --   --  17.1* 17.7*  LATICACIDVEN  --  2.1* 3.3* 1.5  --   --     Liver Function Tests: Recent Labs  Lab 06/16/23 1040 06/17/23 0418  AST 39 20  ALT 40 32  ALKPHOS 85 76  BILITOT 0.6 0.6  PROT 7.5 6.8  ALBUMIN 3.4* 2.9*   No results for input(s): "LIPASE", "AMYLASE" in the last 168 hours. No results for input(s): "AMMONIA" in the last 168 hours.  ABG    Component Value Date/Time   HCO3 25.4 09/22/2021 1743   TCO2 29 11/18/2009 1207   O2SAT 41.2 09/22/2021 1743     Coagulation Profile: Recent Labs  Lab 06/16/23 1040  INR 1.2    Cardiac Enzymes: No results for input(s): "CKTOTAL", "CKMB", "CKMBINDEX", "TROPONINI" in the last 168 hours.  HbA1C: No results found for: "HGBA1C"  CBG: No results for input(s): "GLUCAP" in the last 168 hours.  Review of Systems:   Review of Systems  Constitutional:  Positive for chills and malaise/fatigue. Negative for fever and weight loss.  HENT:  Negative for congestion and sinus pain.   Respiratory:  Positive for cough,  hemoptysis, sputum production, shortness of breath and wheezing.   Cardiovascular:  Negative for chest pain and leg swelling.  Gastrointestinal:  Negative for abdominal pain, nausea and vomiting.   Past Medical History:  She,  has a past medical history of Hypertension, Ovarian cyst, Pulmonary nodule, Sarcoid, SVT (supraventricular tachycardia), and Tubal pregnancy.   Surgical History:   Past Surgical History:  Procedure Laterality Date   LAPAROSCOPIC SALPINGOOPHERECTOMY     unsure of which side.    TUBAL LIGATION       Social History:   reports that she is a non-smoker but has been exposed to tobacco smoke. She has never used smokeless tobacco. She reports current alcohol use of about 2.0 standard drinks of alcohol per week. She reports that she does  not use drugs.   Family History:  Her family history includes Breast cancer in her maternal aunt; Heart disease in her father and mother.   Allergies Allergies  Allergen Reactions   Doxycycline Diarrhea and Nausea And Vomiting   Azithromycin Rash and Other (See Comments)    palpitations     Home Medications  Prior to Admission medications   Medication Sig Start Date End Date Taking? Authorizing Provider  albuterol (PROVENTIL) (2.5 MG/3ML) 0.083% nebulizer solution Take 3 mLs (2.5 mg total) by nebulization every 4 (four) hours as needed for wheezing or shortness of breath. Patient taking differently: Take 2.5 mg by nebulization as needed for wheezing or shortness of breath. 09/25/21  Yes Narda Bonds, MD  albuterol (VENTOLIN HFA) 108 (90 Base) MCG/ACT inhaler Inhale 2 puffs into the lungs as needed for wheezing. 09/21/21  Yes [provider]  Ascorbic Acid (VITAMIN C PO) Take 1 tablet by mouth daily.   Yes [provider]  clobetasol cream (TEMOVATE) 0.05 % Apply 1 Application topically daily as needed (eczema). 05/11/23  Yes [provider]  ibuprofen (ADVIL) 200 MG tablet Take 800 mg by mouth as needed  for headache or moderate pain.   Yes [provider]  losartan-hydrochlorothiazide (HYZAAR) 50-12.5 MG tablet Take 1 tablet by mouth daily. 07/11/21  Yes [provider]  fluconazole (DIFLUCAN) 150 MG tablet 1 tablet Orally once, repeat in 3 days for 7 days Patient not taking: Reported on 06/17/2023 06/09/23   [provider]  metroNIDAZOLE (FLAGYL) 500 MG tablet 1 tablet Orally twice daily for 7 days Patient not taking: Reported on 06/17/2023 06/09/23   [provider]  pantoprazole (PROTONIX) 20 MG tablet Take 1 tablet (20 mg total) by mouth daily. Patient not taking: Reported on 01/30/2020 07/15/19   Alveria Apley, PA-C     Critical care time: n/a       Posey Boyer, MSN, NP, AG-ACNP-BC [facility] 06/19/2023, 12:02 PM  See Amion for pager If no response to pager , please call [phone] until 7pm After 7:00 pm call Elink  [phone]

## 2023-06-20 DIAGNOSIS — A419 Sepsis, unspecified organism: Secondary | ICD-10-CM | POA: Diagnosis not present

## 2023-06-20 DIAGNOSIS — E876 Hypokalemia: Secondary | ICD-10-CM | POA: Diagnosis not present

## 2023-06-20 DIAGNOSIS — D860 Sarcoidosis of lung: Secondary | ICD-10-CM | POA: Diagnosis not present

## 2023-06-20 DIAGNOSIS — J189 Pneumonia, unspecified organism: Secondary | ICD-10-CM | POA: Diagnosis not present

## 2023-06-20 LAB — BRAIN NATRIURETIC PEPTIDE: B Natriuretic Peptide: 131.1 pg/mL — ABNORMAL HIGH (ref 0.0–100.0)

## 2023-06-20 MED ORDER — SODIUM CHLORIDE 3 % IN NEBU
4.0000 mL | INHALATION_SOLUTION | Freq: Two times a day (BID) | RESPIRATORY_TRACT | Status: AC
  Administered 2023-06-20 – 2023-06-22 (×6): 4 mL via RESPIRATORY_TRACT
  Filled 2023-06-20 (×7): qty 4

## 2023-06-20 MED ORDER — HYDRALAZINE HCL 20 MG/ML IJ SOLN: 10.0000 mg | Freq: Four times a day (QID) | INTRAMUSCULAR | Status: DC | PRN

## 2023-06-20 MED ORDER — PREDNISONE 20 MG PO TABS
40.0000 mg | ORAL_TABLET | Freq: Two times a day (BID) | ORAL | Status: DC
  Administered 2023-06-20 – 2023-06-23 (×6): 40 mg via ORAL
  Filled 2023-06-20 (×6): qty 2

## 2023-06-20 NOTE — Progress Notes (Signed)
[facility]                                                                              Lauren Russo, is a 52 y.o. female, DOB - 04/05/1971, ZOX:[id number] Admit date - 06/16/2023    Outpatient Primary MD for the patient is [name], [name], MD (Inactive)  LOS - 4  days  Chief Complaint  Patient presents with   Shortness of Breath       Brief summary   Patient is a 52 year old female with hypertension, ovarian cyst, pulmonary sarcoidosis, SVT, GERD presented to ED with progressively worse dyspnea for last 3 days.  She also reported productive cough of greenish sputum and mild hemoptysis.  She also desaturated on ambulation.  Reported fatigue, malaise, myalgias otherwise no sick contacts or travel history, no fever chills or rhinorrhea.  Lactic acid 2.1 then 3.3.  Temp of 100.2 F, pulse 102, respiratory rate 12, Chest x-ray showed focal opacity right lung base.  CTA negative for PE, showed right middle lobe pneumonia.  Patient was admitted for further workup.  Assessment & Plan    Principal Problem: Sepsis with CAP (community acquired pneumonia) In the setting of pulmonary sarcoidosis -Patient met sepsis criteria on admission with leukocytosis lactic acidosis, febrile, tachycardia, right middle lobe pneumonia  -CTA chest showed no pulmonary embolism however right middle lobe pneumonia -Continue Yupelri, Brovana, Pulmicort with as needed albuterol -Feeling worse, pulmonology following, appreciate recommendations. -Continue IV Zithromax, Rocephin, started on hypertonic nebs with flutter valve today, I-S, Mucinex -Wheezing, prednisone increased to 40 mg twice daily -per pulm, very minimal right pleural effusion, no plan for thoracentesis at this time pulmonology for   Active Problems:   Lactic acidosis -Resolved    Pulmonary sarcoidosis (HCC) -Patient used to follow [place] pulmonology, recommended to follow-up outpatient -Pulmonology following, prednisone  increased today  Hypokalemia -Replace as needed  Insomnia -Placed on melatonin    GERD with stricture -Continue Protonix  Anxiety -Continue Ativan as needed for anxiety    Essential hypertension -Continue losartan/ HCTZ, hydralazine 10 mg IV as needed with parameters    Class 2 obesity Estimated body mass index is 38.61 kg/m as calculated from the following:   Height as of this encounter: 4\' 11"  (1.499 m).   Weight as of this encounter: 86.7 kg.  Code Status: Full code DVT Prophylaxis:  enoxaparin (LOVENOX) injection 40 mg Start: 06/16/23 2200   Level of Care: Level of care: Progressive Family Communication: Updated patient Disposition Plan:      Remains inpatient appropriate:    procedures:  None  Consultants:   Pulm   Antimicrobials:   Anti-infectives (From admission, onward)    Start     Dose/Rate Route Frequency Ordered Stop   06/20/23 1000  azithromycin (ZITHROMAX) tablet 500 mg        500 mg Oral Daily 06/19/23 1506     06/17/23 1000  azithromycin (ZITHROMAX) 500 mg in sodium chloride 0.9 % 250 mL IVPB  Status:  Discontinued        500 mg 250 mL/hr over 60 Minutes Intravenous Every 24 hours 06/16/23 1730 06/19/23 1505  06/16/23 1345  azithromycin (ZITHROMAX) 500 mg in sodium chloride 0.9 % 250 mL IVPB        500 mg 250 mL/hr over 60 Minutes Intravenous  Once 06/16/23 1330 06/16/23 1647   06/16/23 1100  cefTRIAXone (ROCEPHIN) 2 g in sodium chloride 0.9 % 100 mL IVPB        2 g 200 mL/hr over 30 Minutes Intravenous Every 24 hours 06/16/23 1049 06/21/23 1059          Medications  arformoterol  15 mcg Nebulization BID   azithromycin  500 mg Oral Daily   benzonatate  200 mg Oral TID   budesonide (PULMICORT) nebulizer solution  0.5 mg Nebulization BID   enoxaparin (LOVENOX) injection  40 mg Subcutaneous Q24H   guaiFENesin  600 mg Oral BID   losartan  50 mg Oral Daily   And   hydrochlorothiazide  12.5 mg Oral Daily   melatonin  3 mg Oral QHS    pantoprazole  40 mg Oral Daily   predniSONE  40 mg Oral BID WC   revefenacin  175 mcg Nebulization Daily   sodium chloride flush  10-40 mL Intracatheter Q12H   sodium chloride HYPERTONIC  4 mL Nebulization BID      Subjective:   Lauren Russo was seen and examined today.  Feeling very anxious and tearful, states feeling poorly, coughing, wheezing.  No fevers.  BP elevated.    Objective:   Vitals:   06/20/23 0559 06/20/23 0759 06/20/23 1038 06/20/23 1054  BP: (!) 176/95  (!) 169/103   Pulse: (!) 58     Resp: 19     Temp: 98 F (36.7 C)     TempSrc: Oral     SpO2: 97% 96%  97%  Weight:      Height:        Intake/Output Summary (Last 24 hours) at 06/20/2023 1257 Last data filed at 06/19/2023 2130 Gross per 24 hour  Intake 240 ml  Output --  Net 240 ml     Wt Readings from Last 3 Encounters:  06/16/23 86.7 kg  09/22/21 86.6 kg  01/30/20 81.4 kg    Physical Exam General: Alert and oriented, feeling miserable, coughing Cardiovascular: S1 S2 clear, RRR.  Respiratory: Bilateral wheezing with rhonchi , R>L Gastrointestinal: Soft, nontender, nondistended, NBS Ext: no pedal edema bilaterally Neuro: no new deficits Psych: anxious and tearful  Data Reviewed:  I have personally reviewed following labs    CBC Lab Results  Component Value Date   WBC 17.1 (H) 06/20/2023   RBC 3.68 (L) 06/20/2023   HGB 10.7 (L) 06/20/2023   HCT 33.1 (L) 06/20/2023   MCV 89.9 06/20/2023   MCH 29.1 06/20/2023   PLT 326 06/20/2023   MCHC 32.3 06/20/2023   RDW 14.8 06/20/2023   LYMPHSABS 4.1 (H) 06/20/2023   MONOABS 0.7 06/20/2023   EOSABS 0.1 06/20/2023   BASOSABS 0.1 06/20/2023     Last metabolic panel Lab Results  Component Value Date   NA 138 06/19/2023   K 3.4 (L) 06/19/2023   CL 106 06/19/2023   CO2 24 06/19/2023   BUN 16 06/19/2023   CREATININE 0.75 06/19/2023   GLUCOSE 105 (H) 06/19/2023   GFRNONAA >60 06/19/2023   GFRAA >60 07/15/2019   CALCIUM 8.0 (L) 06/19/2023    PROT 6.8 06/17/2023   ALBUMIN 2.9 (L) 06/17/2023   BILITOT 0.6 06/17/2023   ALKPHOS 76 06/17/2023   AST 20 06/17/2023   ALT 32 06/17/2023  ANIONGAP 8 06/19/2023    CBG (last 3)  No results for input(s): "GLUCAP" in the last 72 hours.    Coagulation Profile: Recent Labs  Lab 06/16/23 1040  INR 1.2     Radiology Studies: I have personally reviewed the imaging studies  DG Chest 2 View  Result Date: 06/19/2023 CLINICAL DATA:  Productive cough.  Community-acquired pneumonia. EXAM: CHEST - 2 VIEW COMPARISON:  06/16/2023 FINDINGS: Right base collapse/consolidation with small right pleural effusion again noted. Left lung clear. Cardiopericardial silhouette is at upper limits of normal for size. No acute bony abnormality. IMPRESSION: Persistent right base collapse/consolidation with small right pleural effusion. Electronically Signed   By: Kennith Center M.D.   On: 06/19/2023 13:01         M.D. [facility] 06/20/2023, 12:57 PM  Available via Epic secure chat 7am-7pm After 7 pm, please refer to night coverage provider listed on amion.

## 2023-06-20 NOTE — Progress Notes (Signed)
NAME:  Lauren Russo, MRN:  [id number], DOB:  12/26/1970, LOS: 4 ADMISSION DATE:  06/16/2023, CONSULTATION DATE:  06/19/23 REFERRING MD:  Dr. Isidoro Donning, CHIEF COMPLAINT:  SOB   History of Present Illness:   52 year old female with PMH significant for pulmonary sarcoidosis, former smoker (quit 2013), HTN, and GERD who presented with worsening SOB and cough.  Reports was in her normal state of health prior developing chills on Saturday which was followed by progressive cough and shortness of breath.  Cough productive with intermittent bright red blood streaked, yellow and at times clear.  Some wheezing but otherwise denied any sinus congestion or drainage, reflux symptoms, or LE swelling.  She is not on blood thinners.  She runs a daycare center.   Pt previously followed in our office by Dr. Marchelle Gearing, last seen in 01/2020, since lost to follow-up.  Initial diagnosis in 2013 by biopsy with low ACE levels.  PFTs 2014 w/ FVC 96%, DLCO 68% corrects to 100% for Va.  Last required hospitalization with steroid use in 2022, otherwise has been managing well and follows with her PCP.   Initially reported by triage to have some desaturation on ambulation, but since has not required supplemental O2.   WBC 15, SARS, flu, and RSV negative, urine strep negative, CXR showed focal opacity at right base, and CTA PE revealed a dense RML pna, negative for PE, and showed borderline enlarged 1 cm subcarinal node.  She was admitted to [facility] with sepsis secondary to CAP, placed on solumedrol since converted to oral prednisone, bronchodilators, and antibiotic coverage. Reports was feeling better but this morning she went to the bathroom and on the way back had a coughing spell, was unable to catch her breath, and continues to feel bad.  Cough remains intermittently blood streaked vs yellow vs clear.  Reports symptom improvement with nebulizers.  Remains afebrile and on room air.  Pulmonary consulted for further recommendations and  concern for sarcoid flare.    Pertinent  Medical History  Former smoker, Pulmonary sarcoidosis, HTN, SVT, GERD, ovarian cyst  Significant Hospital Events: Including procedures, antibiotic start and stop dates in addition to other pertinent events   7/30 admitted to [facility] 8/2 PCCM consulted, CXR with dense RLL opacity and small right effusion  Interim History / Subjective:   No acute events overnight, continues to have significant productive cough and dyspnea.   No fevers or oxygen requirements  Objective   Blood pressure (!) 176/95, pulse (!) 58, temperature 98 F (36.7 C), temperature source Oral, resp. rate 19, height 4\' 11"  (1.499 m), weight 86.7 kg, SpO2 96%.        Intake/Output Summary (Last 24 hours) at 06/20/2023 4098 Last data filed at 06/19/2023 2130 Gross per 24 hour  Intake 360 ml  Output --  Net 360 ml   Filed Weights   06/16/23 1722  Weight: 86.7 kg   Examination: General: no acute distress, resting in bed, intermittently coughing HEENT: [place]/AT, moist mucous membranes, sclera anicteric Neuro: A&O x 3, moving all extremities CV: rrr, s1s2, no murmurs PULM: decreased breath sounds right base. Mild expiratory wheezing right mid lung GI: soft, non-tender, non-distended, BS+ Extremities: warm, no edema Skin: no rashes   Resolved Hospital Problem list    Assessment & Plan:  Community Acquired pneumonia, RML/RLL -urine strep neg, SARS/ Flu/ RSV neg, Resp Viral panel negative Mild hemoptysis - CTA PE neg for PE Small Right Pleural Effusion Hx of Pulmonary Sarcoidosis - no progressive disease  on recent CT  P:  - reassuring that pt not requiring supplemental O2 - cont CAP coverage w/ azithro and ceftriaxone  - H/H remains stable, blood streaked sputum likely 2/2 dense PNA - Increase prednisone 40mg  daily to BID dosing - continue yupelri, brovanna, pulmicort with prn albuterol - hycodan for cough suppression - cont pulmonary hygiene: start hypertonic nebs with  flutter valve, IS, mucinex, mobilize  - cont PPI - Will check bedside US today - will need follow-up with pulmonologist> either with our group or different practice with repeat CT in 2-3 months to ensure clearing of process.   Pulmonary will continue to follow   Best Practice (right click and "Reselect all SmartList Selections" daily)  Per primary team  Labs   CBC: Recent Labs  Lab 06/16/23 1040 06/17/23 0418 06/18/23 0849 06/20/23 0450  WBC 15.0* 17.1* 17.7* 17.1*  NEUTROABS 12.7*  --   --  11.3*  HGB 12.1 11.1* 10.9* 10.7*  HCT 36.4 33.1* 33.8* 33.1*  MCV 87.3 87.6 90.4 89.9  PLT 260 224 267 326    Basic Metabolic Panel: Recent Labs  Lab 06/16/23 1040 06/17/23 0418 06/18/23 0849 06/19/23 0400  NA 132* 137 139 138  K 2.4* 3.4* 3.0* 3.4*  CL 95* 104 106 106  CO2 25 24 22 24   GLUCOSE 167* 167* 127* 105*  BUN 9 10 16 16   CREATININE 0.89 0.69 0.77 0.75  CALCIUM 8.5* 7.9* 8.0* 8.0*  MG  --   --   --  2.2   GFR: Estimated Creatinine Clearance: 80.5 mL/min (by C-G formula based on SCr of 0.75 mg/dL). Recent Labs  Lab 06/16/23 1040 06/16/23 1100 06/16/23 1315 06/16/23 1748 06/17/23 0418 06/18/23 0849 06/20/23 0450  WBC 15.0*  --   --   --  17.1* 17.7* 17.1*  LATICACIDVEN  --  2.1* 3.3* 1.5  --   --   --     Liver Function Tests: Recent Labs  Lab 06/16/23 1040 06/17/23 0418  AST 39 20  ALT 40 32  ALKPHOS 85 76  BILITOT 0.6 0.6  PROT 7.5 6.8  ALBUMIN 3.4* 2.9*   No results for input(s): "LIPASE", "AMYLASE" in the last 168 hours. No results for input(s): "AMMONIA" in the last 168 hours.  ABG    Component Value Date/Time   HCO3 25.4 09/22/2021 1743   TCO2 29 11/18/2009 1207   O2SAT 41.2 09/22/2021 1743     Coagulation Profile: Recent Labs  Lab 06/16/23 1040  INR 1.2    Cardiac Enzymes: No results for input(s): "CKTOTAL", "CKMB", "CKMBINDEX", "TROPONINI" in the last 168 hours.  HbA1C: No results found for: "HGBA1C"  CBG: No results  for input(s): "GLUCAP" in the last 168 hours.     Critical care time: n/a     Melody Comas, MD [facility] Office: [phone]   See Amion for personal pager PCCM on call pager [phone] until 7pm. Please call Elink 7p-7a. [phone]

## 2023-06-20 NOTE — Progress Notes (Signed)
Bedside US:  Very minimal right effusion, otherwise normal lung sliding.   No plan for thoracentesis at this time.    Melody Comas, MD [facility] Office: [phone]   See Amion for personal pager PCCM on call pager [phone] until 7pm. Please call Elink 7p-7a. [phone]

## 2023-06-21 DIAGNOSIS — I10 Essential (primary) hypertension: Secondary | ICD-10-CM

## 2023-06-21 DIAGNOSIS — D860 Sarcoidosis of lung: Secondary | ICD-10-CM | POA: Diagnosis not present

## 2023-06-21 DIAGNOSIS — E876 Hypokalemia: Secondary | ICD-10-CM | POA: Diagnosis not present

## 2023-06-21 DIAGNOSIS — J189 Pneumonia, unspecified organism: Secondary | ICD-10-CM | POA: Diagnosis not present

## 2023-06-21 DIAGNOSIS — A419 Sepsis, unspecified organism: Secondary | ICD-10-CM | POA: Diagnosis not present

## 2023-06-21 LAB — CBC
HCT: 35 % — ABNORMAL LOW (ref 36.0–46.0)
Hemoglobin: 11.5 g/dL — ABNORMAL LOW (ref 12.0–15.0)
MCH: 28.7 pg (ref 26.0–34.0)
MCHC: 32.9 g/dL (ref 30.0–36.0)
MCV: 87.3 fL (ref 80.0–100.0)
Platelets: 424 10*3/uL — ABNORMAL HIGH (ref 150–400)
RBC: 4.01 MIL/uL (ref 3.87–5.11)
RDW: 14.6 % (ref 11.5–15.5)
WBC: 21.7 10*3/uL — ABNORMAL HIGH (ref 4.0–10.5)
nRBC: 0 % (ref 0.0–0.2)

## 2023-06-21 LAB — BASIC METABOLIC PANEL WITH GFR
Anion gap: 10 (ref 5–15)
BUN: 22 mg/dL — ABNORMAL HIGH (ref 6–20)
CO2: 23 mmol/L (ref 22–32)
Calcium: 9.3 mg/dL (ref 8.9–10.3)
Chloride: 101 mmol/L (ref 98–111)
Creatinine, Ser: 0.63 mg/dL (ref 0.44–1.00)
GFR, Estimated: >60 mL/min (ref >60)
Glucose, Bld: 130 mg/dL — ABNORMAL HIGH (ref 70–99)
Potassium: 4.6 mmol/L (ref 3.5–5.1)
Sodium: 134 mmol/L — ABNORMAL LOW (ref 135–145)

## 2023-06-21 MED ORDER — FUROSEMIDE 10 MG/ML IJ SOLN
40.0000 mg | Freq: Once | INTRAMUSCULAR | Status: AC
  Administered 2023-06-21: 40 mg via INTRAVENOUS
  Filled 2023-06-21: qty 4

## 2023-06-21 NOTE — Plan of Care (Signed)
?  Problem: Activity: ?Goal: Ability to tolerate increased activity will improve ?Outcome: Adequate for Discharge ?  ?Problem: Clinical Measurements: ?Goal: Ability to maintain a body temperature in the normal range will improve ?Outcome: Adequate for Discharge ?  ?Problem: Respiratory: ?Goal: Ability to maintain adequate ventilation will improve ?Outcome: Adequate for Discharge ?  ?

## 2023-06-21 NOTE — Progress Notes (Signed)
NAME:  Lauren Russo, MRN:  [id number], DOB:  1971-04-19, LOS: 5 ADMISSION DATE:  06/16/2023, CONSULTATION DATE:  06/19/23 REFERRING MD:  Dr. Isidoro Donning, CHIEF COMPLAINT:  SOB   History of Present Illness:   52 year old female with PMH significant for pulmonary sarcoidosis, former smoker (quit 2013), HTN, and GERD who presented with worsening SOB and cough.  Reports was in her normal state of health prior developing chills on Saturday which was followed by progressive cough and shortness of breath.  Cough productive with intermittent bright red blood streaked, yellow and at times clear.  Some wheezing but otherwise denied any sinus congestion or drainage, reflux symptoms, or LE swelling.  She is not on blood thinners.  She runs a daycare center.   Pt previously followed in our office by Dr. Marchelle Gearing, last seen in 01/2020, since lost to follow-up.  Initial diagnosis in 2013 by biopsy with low ACE levels.  PFTs 2014 w/ FVC 96%, DLCO 68% corrects to 100% for [facility].  Last required hospitalization with steroid use in 2022, otherwise has been managing well and follows with her PCP.   Initially reported by triage to have some desaturation on ambulation, but since has not required supplemental O2.   WBC 15, SARS, flu, and RSV negative, urine strep negative, CXR showed focal opacity at right base, and CTA PE revealed a dense RML pna, negative for PE, and showed borderline enlarged 1 cm subcarinal node.  She was admitted to [facility] with sepsis secondary to CAP, placed on solumedrol since converted to oral prednisone, bronchodilators, and antibiotic coverage. Reports was feeling better but this morning she went to the bathroom and on the way back had a coughing spell, was unable to catch her breath, and continues to feel bad.  Cough remains intermittently blood streaked vs yellow vs clear.  Reports symptom improvement with nebulizers.  Remains afebrile and on room air.  Pulmonary consulted for further recommendations and  concern for sarcoid flare.    Pertinent  Medical History  Former smoker, Pulmonary sarcoidosis, HTN, SVT, GERD, ovarian cyst  Significant Hospital Events: Including procedures, antibiotic start and stop dates in addition to other pertinent events   7/30 admitted to [facility] 8/2 PCCM consulted, CXR with dense RLL opacity and small right effusion  Interim History / Subjective:   No acute events overnight Patient feeling a bit better today with reduced coughing and less dyspnea No fevers or oxygen requirements  Objective   Blood pressure (!) 175/94, pulse 63, temperature 98.2 F (36.8 C), temperature source Oral, resp. rate 18, height 4\' 11"  (1.499 m), weight 86.7 kg, SpO2 99%.        Intake/Output Summary (Last 24 hours) at 06/21/2023 1138 Last data filed at 06/21/2023 1105 Gross per 24 hour  Intake 350 ml  Output --  Net 350 ml   Filed Weights   06/16/23 1722  Weight: 86.7 kg   Examination: General: no acute distress, resting in bed, intermittently coughing HEENT: [place]/AT, moist mucous membranes, sclera anicteric Neuro: A&O x 3, moving all extremities CV: rrr, s1s2, no murmurs PULM: improved aeration of right lung, no wheezing GI: soft, non-tender, non-distended, BS+ Extremities: warm, no edema Skin: no rashes   Resolved Hospital Problem list    Assessment & Plan:  Community Acquired pneumonia, RML/RLL -urine strep neg, SARS/ Flu/ RSV neg, Resp Viral panel negative Mild hemoptysis - CTA PE neg for PE Small Right Pleural Effusion Hx of Pulmonary Sarcoidosis - no progressive disease on recent CT  P:  - reassuring that pt not requiring supplemental O2 - cont CAP coverage w/ azithro and ceftriaxone  - Continue prednisone 40mg  BID - continue yupelri, brovanna, pulmicort with prn albuterol - hycodan for cough suppression - cont pulmonary hygiene: hypertonic nebs with flutter valve, IS, mucinex, mobilize  - cont PPI - Bedside US yesterday with minimal right effusion - will  need follow-up with pulmonologist> either with our group or different practice with repeat CT in 2-3 months to ensure clearing of process.   Pulmonary will continue to follow   Best Practice (right click and "Reselect all SmartList Selections" daily)  Per primary team  Labs   CBC: Recent Labs  Lab 06/16/23 1040 06/17/23 0418 06/18/23 0849 06/20/23 0450 06/21/23 0448  WBC 15.0* 17.1* 17.7* 17.1* 21.7*  NEUTROABS 12.7*  --   --  11.3*  --   HGB 12.1 11.1* 10.9* 10.7* 11.5*  HCT 36.4 33.1* 33.8* 33.1* 35.0*  MCV 87.3 87.6 90.4 89.9 87.3  PLT 260 224 267 326 424*    Basic Metabolic Panel: Recent Labs  Lab 06/16/23 1040 06/17/23 0418 06/18/23 0849 06/19/23 0400 06/21/23 0448  NA 132* 137 139 138 134*  K 2.4* 3.4* 3.0* 3.4* 4.6  CL 95* 104 106 106 101  CO2 25 24 22 24 23   GLUCOSE 167* 167* 127* 105* 130*  BUN 9 10 16 16  22*  CREATININE 0.89 0.69 0.77 0.75 0.63  CALCIUM 8.5* 7.9* 8.0* 8.0* 9.3  MG  --   --   --  2.2  --    GFR: Estimated Creatinine Clearance: 80.5 mL/min (by C-G formula based on SCr of 0.63 mg/dL). Recent Labs  Lab 06/16/23 1100 06/16/23 1315 06/16/23 1748 06/17/23 0418 06/18/23 0849 06/20/23 0450 06/21/23 0448  WBC  --   --   --  17.1* 17.7* 17.1* 21.7*  LATICACIDVEN 2.1* 3.3* 1.5  --   --   --   --     Liver Function Tests: Recent Labs  Lab 06/16/23 1040 06/17/23 0418  AST 39 20  ALT 40 32  ALKPHOS 85 76  BILITOT 0.6 0.6  PROT 7.5 6.8  ALBUMIN 3.4* 2.9*   No results for input(s): "LIPASE", "AMYLASE" in the last 168 hours. No results for input(s): "AMMONIA" in the last 168 hours.  ABG    Component Value Date/Time   HCO3 25.4 09/22/2021 1743   TCO2 29 11/18/2009 1207   O2SAT 41.2 09/22/2021 1743     Coagulation Profile: Recent Labs  Lab 06/16/23 1040  INR 1.2    Cardiac Enzymes: No results for input(s): "CKTOTAL", "CKMB", "CKMBINDEX", "TROPONINI" in the last 168 hours.  HbA1C: No results found for:  "HGBA1C"  CBG: No results for input(s): "GLUCAP" in the last 168 hours.     Critical care time: n/a     Melody Comas, MD [facility] Office: [phone]   See Amion for personal pager PCCM on call pager [phone] until 7pm. Please call Elink 7p-7a. [phone]

## 2023-06-21 NOTE — Plan of Care (Signed)
  Problem: Activity: Goal: Ability to tolerate increased activity will improve Outcome: Progressing   Problem: Clinical Measurements: Goal: Ability to maintain a body temperature in the normal range will improve Outcome: Progressing   Problem: Respiratory: Goal: Ability to maintain adequate ventilation will improve Outcome: Not Progressing Goal: Ability to maintain a clear airway will improve Outcome: Not Progressing

## 2023-06-21 NOTE — Progress Notes (Signed)
[facility]                                                                              Lauren Russo, is a 52 y.o. female, DOB - March 02, 1971, [id number] Admit date - 06/16/2023    Outpatient Primary MD for the patient is [name], [name], MD (Inactive)  LOS - 5  days  Chief Complaint  Patient presents with   Shortness of Breath       Brief summary   Patient is a 52 year old female with hypertension, ovarian cyst, pulmonary sarcoidosis, SVT, GERD presented to ED with progressively worse dyspnea for last 3 days.  She also reported productive cough of greenish sputum and mild hemoptysis.  She also desaturated on ambulation.  Reported fatigue, malaise, myalgias otherwise no sick contacts or travel history, no fever chills or rhinorrhea.  Lactic acid 2.1 then 3.3.  Temp of 100.2 F, pulse 102, respiratory rate 12, Chest x-ray showed focal opacity right lung base.  CTA negative for PE, showed right middle lobe pneumonia.  Patient was admitted for further workup.  Assessment & Plan    Principal Problem: Sepsis with CAP (community acquired pneumonia) In the setting of pulmonary sarcoidosis -Patient met sepsis criteria on admission with leukocytosis lactic acidosis, febrile, tachycardia, right middle lobe pneumonia  -CTA chest showed no pulmonary embolism however right middle lobe pneumonia -Continue Yupelri, Brovana, Pulmicort with as needed albuterol -Continue IV Zithromax, Rocephin, prednisone 40 mg twice daily, flutter valve -Pulmonology following, patient feels somewhat better, bedside ultrasound with minimal right effusion, BNP 131.1, will give Lasix 40 mg IV x 1   Active Problems:   Lactic acidosis -Resolved    Pulmonary sarcoidosis (HCC) -Patient used to follow  pulmonology, recommended to follow-up outpatient  Hypokalemia -Replace as needed  Insomnia -Continue melatonin    GERD with stricture -Continue Protonix  Anxiety -Continue Ativan  as needed for anxiety    Essential hypertension -Currently on losartan, HCTZ    Class 2 obesity Estimated body mass index is 38.61 kg/m as calculated from the following:   Height as of this encounter: 4\' 11"  (1.499 m).   Weight as of this encounter: 86.7 kg.  Code Status: Full code DVT Prophylaxis:  enoxaparin (LOVENOX) injection 40 mg Start: 06/16/23 2200   Level of Care: Level of care: Progressive Family Communication: Updated patient Disposition Plan:      Remains inpatient appropriate:    procedures:  None  Consultants:   Pulm   Antimicrobials:   Anti-infectives (From admission, onward)    Start     Dose/Rate Route Frequency Ordered Stop   06/20/23 1000  azithromycin (ZITHROMAX) tablet 500 mg        500 mg Oral Daily 06/19/23 1506     06/17/23 1000  azithromycin (ZITHROMAX) 500 mg in sodium chloride 0.9 % 250 mL IVPB  Status:  Discontinued        500 mg 250 mL/hr over 60 Minutes Intravenous Every 24 hours 06/16/23 1730 06/19/23 1505   06/16/23 1345  azithromycin (ZITHROMAX) 500 mg in sodium chloride 0.9 % 250 mL IVPB        500 mg  250 mL/hr over 60 Minutes Intravenous  Once 06/16/23 1330 06/16/23 1647   06/16/23 1100  cefTRIAXone (ROCEPHIN) 2 g in sodium chloride 0.9 % 100 mL IVPB        2 g 200 mL/hr over 30 Minutes Intravenous Every 24 hours 06/16/23 1049 06/20/23 1329          Medications  arformoterol  15 mcg Nebulization BID   azithromycin  500 mg Oral Daily   benzonatate  200 mg Oral TID   budesonide (PULMICORT) nebulizer solution  0.5 mg Nebulization BID   enoxaparin (LOVENOX) injection  40 mg Subcutaneous Q24H   guaiFENesin  600 mg Oral BID   losartan  50 mg Oral Daily   And   hydrochlorothiazide  12.5 mg Oral Daily   melatonin  3 mg Oral QHS   pantoprazole  40 mg Oral Daily   predniSONE  40 mg Oral BID WC   revefenacin  175 mcg Nebulization Daily   sodium chloride flush  10-40 mL Intracatheter Q12H   sodium chloride HYPERTONIC  4 mL  Nebulization BID      Subjective:   Lauren Russo was seen and examined today.  Watching the show on her iPad.  Still coughing, feeling somewhat better today and able to speak in full sentences.  Objective:   Vitals:   06/21/23 0630 06/21/23 0637 06/21/23 1154 06/21/23 1312  BP: (!) 175/94   (!) 131/105  Pulse: 63   89  Resp: 18   18  Temp: 98.2 F (36.8 C)   99.1 F (37.3 C)  TempSrc: Oral   Oral  SpO2: 99% 99% 100% 99%  Weight:      Height:        Intake/Output Summary (Last 24 hours) at 06/21/2023 1334 Last data filed at 06/21/2023 1105 Gross per 24 hour  Intake 350 ml  Output --  Net 350 ml     Wt Readings from Last 3 Encounters:  06/16/23 86.7 kg  09/22/21 86.6 kg  01/30/20 81.4 kg   Physical Exam General: Alert and oriented x 3, mildly improved, still intermittent coughing. Cardiovascular: S1 S2 clear, RRR.  Respiratory:  improving aeration b/l lungs Gastrointestinal: Soft, nontender, nondistended, NBS Ext: no pedal edema bilaterally Neuro: no new deficits Psych: Normal affect  Data Reviewed:  I have personally reviewed following labs    CBC Lab Results  Component Value Date   WBC 21.7 (H) 06/21/2023   RBC 4.01 06/21/2023   HGB 11.5 (L) 06/21/2023   HCT 35.0 (L) 06/21/2023   MCV 87.3 06/21/2023   MCH 28.7 06/21/2023   PLT 424 (H) 06/21/2023   MCHC 32.9 06/21/2023   RDW 14.6 06/21/2023   LYMPHSABS 4.1 (H) 06/20/2023   MONOABS 0.7 06/20/2023   EOSABS 0.1 06/20/2023   BASOSABS 0.1 06/20/2023     Last metabolic panel Lab Results  Component Value Date   NA 134 (L) 06/21/2023   K 4.6 06/21/2023   CL 101 06/21/2023   CO2 23 06/21/2023   BUN 22 (H) 06/21/2023   CREATININE 0.63 06/21/2023   GLUCOSE 130 (H) 06/21/2023   GFRNONAA >60 06/21/2023   GFRAA >60 07/15/2019   CALCIUM 9.3 06/21/2023   PROT 6.8 06/17/2023   ALBUMIN 2.9 (L) 06/17/2023   BILITOT 0.6 06/17/2023   ALKPHOS 76 06/17/2023   AST 20 06/17/2023   ALT 32 06/17/2023    ANIONGAP 10 06/21/2023    CBG (last 3)  No results for input(s): "GLUCAP" in the last 72  hours.    Coagulation Profile: Recent Labs  Lab 06/16/23 1040  INR 1.2     Radiology Studies: I have personally reviewed the imaging studies  No results found.     Thad Ranger M.D. [facility] 06/21/2023, 1:34 PM  Available via Epic secure chat 7am-7pm After 7 pm, please refer to night coverage provider listed on amion.

## 2023-06-22 ENCOUNTER — Inpatient Hospital Stay (HOSPITAL_COMMUNITY): Payer: Commercial Managed Care - HMO

## 2023-06-22 DIAGNOSIS — D860 Sarcoidosis of lung: Secondary | ICD-10-CM | POA: Diagnosis not present

## 2023-06-22 DIAGNOSIS — E876 Hypokalemia: Secondary | ICD-10-CM | POA: Diagnosis not present

## 2023-06-22 DIAGNOSIS — I5031 Acute diastolic (congestive) heart failure: Secondary | ICD-10-CM

## 2023-06-22 DIAGNOSIS — A419 Sepsis, unspecified organism: Secondary | ICD-10-CM | POA: Diagnosis not present

## 2023-06-22 DIAGNOSIS — J189 Pneumonia, unspecified organism: Secondary | ICD-10-CM | POA: Diagnosis not present

## 2023-06-22 LAB — ECHOCARDIOGRAM COMPLETE
Height: 59 in
S' Lateral: 3.1 cm
Weight: 3058.22 oz

## 2023-06-22 MED ORDER — FUROSEMIDE 10 MG/ML IJ SOLN
40.0000 mg | Freq: Once | INTRAMUSCULAR | Status: AC
  Administered 2023-06-22: 40 mg via INTRAVENOUS
  Filled 2023-06-22: qty 4

## 2023-06-22 NOTE — Plan of Care (Signed)

## 2023-06-22 NOTE — Progress Notes (Signed)
[facility]                                                                              Lauren Russo, is a 52 y.o. female, DOB - [birth year], [id number] Admit date - 06/16/2023    Outpatient Primary MD for the patient is [name], [name], MD (Inactive)  LOS - 6  days  Chief Complaint  Patient presents with   Shortness of Breath       Brief summary   Patient is a 52 year old female with hypertension, ovarian cyst, pulmonary sarcoidosis, SVT, GERD presented to ED with progressively worse dyspnea for last 3 days.  She also reported productive cough of greenish sputum and mild hemoptysis.  She also desaturated on ambulation.  Reported fatigue, malaise, myalgias otherwise no sick contacts or travel history, no fever chills or rhinorrhea.  Lactic acid 2.1 then 3.3.  Temp of 100.2 F, pulse 102, respiratory rate 12, Chest x-ray showed focal opacity right lung base.  CTA negative for PE, showed right middle lobe pneumonia.  Patient was admitted for further workup.  Assessment & Plan    Principal Problem: Sepsis with CAP (community acquired pneumonia) In the setting of pulmonary sarcoidosis -Patient met sepsis criteria on admission with leukocytosis lactic acidosis, febrile, tachycardia, right middle lobe pneumonia  -CTA chest showed no pulmonary embolism however right middle lobe pneumonia -Pulmonology following, continue Yupelri, Brovana, Pulmicort with as needed albuterol -Continue IV Zithromax, Rocephin, prednisone 40 mg twice daily, flutter valve, hypertonic nebs -BNP 131.1, received Lasix 40 mg IV x 1 on 8/4, patient reported good output and feeling somewhat better, will give another dose today and follow echo.     Active Problems:   Lactic acidosis -Resolved    Pulmonary sarcoidosis (HCC) -Patient used to follow [facility], recommended to follow-up outpatient  Hypokalemia -Stable, replace as needed  Insomnia -Continue melatonin    GERD with  stricture -Continue Protonix  Anxiety -Continue Ativan as needed for anxiety    Essential hypertension -BP stable, continue losartan, HCTZ    Class 2 obesity Estimated body mass index is 38.61 kg/m as calculated from the following:   Height as of this encounter: 4\' 11"  (1.499 m).   Weight as of this encounter: 86.7 kg.  Code Status: Full code DVT Prophylaxis:  enoxaparin (LOVENOX) injection 40 mg Start: 06/16/23 2200   Level of Care: Level of care: Progressive Family Communication: Updated patient Disposition Plan:      Remains inpatient appropriate:    procedures:  None  Consultants:   Pulm   Antimicrobials:   Anti-infectives (From admission, onward)    Start     Dose/Rate Route Frequency Ordered Stop   06/20/23 1000  azithromycin (ZITHROMAX) tablet 500 mg        500 mg Oral Daily 06/19/23 1506     06/17/23 1000  azithromycin (ZITHROMAX) 500 mg in sodium chloride 0.9 % 250 mL IVPB  Status:  Discontinued        500 mg 250 mL/hr over 60 Minutes Intravenous Every 24 hours 06/16/23 1730 06/19/23 1505   06/16/23 1345  azithromycin (ZITHROMAX) 500 mg in sodium chloride 0.9 %  250 mL IVPB        500 mg 250 mL/hr over 60 Minutes Intravenous  Once 06/16/23 1330 06/16/23 1647   06/16/23 1100  cefTRIAXone (ROCEPHIN) 2 g in sodium chloride 0.9 % 100 mL IVPB        2 g 200 mL/hr over 30 Minutes Intravenous Every 24 hours 06/16/23 1049 06/20/23 1329          Medications  arformoterol  15 mcg Nebulization BID   azithromycin  500 mg Oral Daily   benzonatate  200 mg Oral TID   budesonide (PULMICORT) nebulizer solution  0.5 mg Nebulization BID   enoxaparin (LOVENOX) injection  40 mg Subcutaneous Q24H   guaiFENesin  600 mg Oral BID   losartan  50 mg Oral Daily   And   hydrochlorothiazide  12.5 mg Oral Daily   melatonin  3 mg Oral QHS   pantoprazole  40 mg Oral Daily   predniSONE  40 mg Oral BID WC   revefenacin  175 mcg Nebulization Daily   sodium chloride HYPERTONIC  4  mL Nebulization BID      Subjective:   Lauren Russo was seen and examined today.  States felt better after IV Lasix.  Voice is improving, still feels short of breath on ambulating.  No acute chest pain, fevers or chills.  Still having coughing spells.  Objective:   Vitals:   06/21/23 2100 06/22/23 0448 06/22/23 0738 06/22/23 1214  BP: 126/84 (!) 158/92  123/88  Pulse: 72 75  (!) 106  Resp: 18 18  18   Temp: 98.1 F (36.7 C) 98 F (36.7 C)  98.4 F (36.9 C)  TempSrc: Oral Oral  Oral  SpO2: 100% 100% 99% 100%  Weight:      Height:        Intake/Output Summary (Last 24 hours) at 06/22/2023 1301 Last data filed at 06/22/2023 0900 Gross per 24 hour  Intake 480 ml  Output --  Net 480 ml     Wt Readings from Last 3 Encounters:  06/16/23 86.7 kg  09/22/21 86.6 kg  01/30/20 81.4 kg    Physical Exam General: Alert and oriented x 3, NAD Cardiovascular: S1 S2 clear, RRR.  Respiratory: Better aeration today, no wheezing Gastrointestinal: Soft, nontender, nondistended, NBS Ext: no pedal edema bilaterally Neuro: no new deficits Psych: Normal affect   Data Reviewed:  I have personally reviewed following labs    CBC Lab Results  Component Value Date   WBC 23.0 (H) 06/22/2023   RBC 4.26 06/22/2023   HGB 12.2 06/22/2023   HCT 37.5 06/22/2023   MCV 88.0 06/22/2023   MCH 28.6 06/22/2023   PLT 500 (H) 06/22/2023   MCHC 32.5 06/22/2023   RDW 14.9 06/22/2023   LYMPHSABS 4.1 (H) 06/20/2023   MONOABS 0.7 06/20/2023   EOSABS 0.1 06/20/2023   BASOSABS 0.1 06/20/2023     Last metabolic panel Lab Results  Component Value Date   NA 135 06/22/2023   K 4.4 06/22/2023   CL 96 (L) 06/22/2023   CO2 28 06/22/2023   BUN 22 (H) 06/22/2023   CREATININE 0.75 06/22/2023   GLUCOSE 152 (H) 06/22/2023   GFRNONAA >60 06/22/2023   GFRAA >60 07/15/2019   CALCIUM 9.4 06/22/2023   PROT 6.8 06/17/2023   ALBUMIN 2.9 (L) 06/17/2023   BILITOT 0.6 06/17/2023   ALKPHOS 76 06/17/2023    AST 20 06/17/2023   ALT 32 06/17/2023   ANIONGAP 11 06/22/2023    CBG (last  3)  No results for input(s): "GLUCAP" in the last 72 hours.    Coagulation Profile: Recent Labs  Lab 06/16/23 1040  INR 1.2     Radiology Studies: I have personally reviewed the imaging studies  No results found.     Thad Ranger M.D. [facility] 06/22/2023, 1:01 PM  Available via Epic secure chat 7am-7pm After 7 pm, please refer to night coverage provider listed on amion.

## 2023-06-22 NOTE — Progress Notes (Signed)
NAME:  Lauren Russo, MRN:  [id number], DOB:  06-18-1971, LOS: 6 ADMISSION DATE:  06/16/2023, CONSULTATION DATE:  06/19/23 REFERRING MD:  Dr. Isidoro Donning, CHIEF COMPLAINT:  SOB   History of Present Illness:   52 year old female with PMH significant for pulmonary sarcoidosis, former smoker (quit 2013), HTN, and GERD who presented with worsening SOB and cough.  Reports was in her normal state of health prior developing chills on Saturday which was followed by progressive cough and shortness of breath.  Cough productive with intermittent bright red blood streaked, yellow and at times clear.  Some wheezing but otherwise denied any sinus congestion or drainage, reflux symptoms, or LE swelling.  She is not on blood thinners.  She runs a daycare center.   Pt previously followed in our office by Dr. Marchelle Gearing, last seen in 01/2020, since lost to follow-up.  Initial diagnosis in 2013 by biopsy with low ACE levels.  PFTs 2014 w/ FVC 96%, DLCO 68% corrects to 100% for [facility].  Last required hospitalization with steroid use in 2022, otherwise has been managing well and follows with her PCP.   Initially reported by triage to have some desaturation on ambulation, but since has not required supplemental O2.   WBC 15, SARS, flu, and RSV negative, urine strep negative, CXR showed focal opacity at right base, and CTA PE revealed a dense RML pna, negative for PE, and showed borderline enlarged 1 cm subcarinal node.  She was admitted to [facility] with sepsis secondary to CAP, placed on solumedrol since converted to oral prednisone, bronchodilators, and antibiotic coverage. Reports was feeling better but this morning she went to the bathroom and on the way back had a coughing spell, was unable to catch her breath, and continues to feel bad.  Cough remains intermittently blood streaked vs yellow vs clear.  Reports symptom improvement with nebulizers.  Remains afebrile and on room air.  Pulmonary consulted for further recommendations and  concern for sarcoid flare.    Pertinent  Medical History  Former smoker, Pulmonary sarcoidosis, HTN, SVT, GERD, ovarian cyst  Significant Hospital Events: Including procedures, antibiotic start and stop dates in addition to other pertinent events   7/30 admitted to [facility] 8/2 PCCM consulted, CXR with dense RLL opacity and small right effusion  Interim History / Subjective:   No acute events overnight Patient feeling even better today and is ambulating  Objective   Blood pressure (!) 158/92, pulse 75, temperature 98 F (36.7 C), temperature source Oral, resp. rate 18, height 4\' 11"  (1.499 m), weight 86.7 kg, SpO2 99%.        Intake/Output Summary (Last 24 hours) at 06/22/2023 8469 Last data filed at 06/22/2023 0300 Gross per 24 hour  Intake 370 ml  Output --  Net 370 ml   Filed Weights   06/16/23 1722  Weight: 86.7 kg   Examination: General: no acute distress, resting in bed, intermittently coughing HEENT: [place]/AT, moist mucous membranes, sclera anicteric Neuro: A&O x 3, moving all extremities CV: rrr, s1s2, no murmurs PULM: no wheezing, even better aeration of right lung GI: soft, non-tender, non-distended, BS+ Extremities: warm, no edema Skin: no rashes   Resolved Hospital Problem list    Assessment & Plan:  Community Acquired pneumonia, RML/RLL -urine strep neg, SARS/ Flu/ RSV neg, Resp Viral panel negative Mild hemoptysis - CTA PE neg for PE Small Right Pleural Effusion Hx of Pulmonary Sarcoidosis - no progressive disease on recent CT  P:  - reassuring that pt not  requiring supplemental O2 - Complete CAP coverage w/ azithro and ceftriaxone today - Continue prednisone 40mg  BID, will plan for extended steroid taper tomorrow - continue yupelri, brovanna, pulmicort with prn albuterol - hycodan for cough suppression - cont pulmonary hygiene: hypertonic nebs with flutter valve, IS, mucinex, mobilize  - cont PPI - Intermitten lasix per primary team - f/u echo results -  will need follow-up with pulmonologist> either with our group or different practice with repeat CT in 2-3 months to ensure clearing of process.   Pulmonary will continue to follow   Best Practice (right click and "Reselect all SmartList Selections" daily)  Per primary team  Labs   CBC: Recent Labs  Lab 06/16/23 1040 06/17/23 0418 06/18/23 0849 06/20/23 0450 06/21/23 0448 06/22/23 0337  WBC 15.0* 17.1* 17.7* 17.1* 21.7* 23.0*  NEUTROABS 12.7*  --   --  11.3*  --   --   HGB 12.1 11.1* 10.9* 10.7* 11.5* 12.2  HCT 36.4 33.1* 33.8* 33.1* 35.0* 37.5  MCV 87.3 87.6 90.4 89.9 87.3 88.0  PLT 260 224 267 326 424* 500*    Basic Metabolic Panel: Recent Labs  Lab 06/17/23 0418 06/18/23 0849 06/19/23 0400 06/21/23 0448 06/22/23 0337  NA 137 139 138 134* 135  K 3.4* 3.0* 3.4* 4.6 4.4  CL 104 106 106 101 96*  CO2 24 22 24 23 28   GLUCOSE 167* 127* 105* 130* 152*  BUN 10 16 16  22* 22*  CREATININE 0.69 0.77 0.75 0.63 0.75  CALCIUM 7.9* 8.0* 8.0* 9.3 9.4  MG  --   --  2.2  --   --    GFR: Estimated Creatinine Clearance: 80.5 mL/min (by C-G formula based on SCr of 0.75 mg/dL). Recent Labs  Lab 06/16/23 1100 06/16/23 1315 06/16/23 1748 06/17/23 0418 06/18/23 0849 06/20/23 0450 06/21/23 0448 06/22/23 0337  WBC  --   --   --    < > 17.7* 17.1* 21.7* 23.0*  LATICACIDVEN 2.1* 3.3* 1.5  --   --   --   --   --    < > = values in this interval not displayed.    Liver Function Tests: Recent Labs  Lab 06/16/23 1040 06/17/23 0418  AST 39 20  ALT 40 32  ALKPHOS 85 76  BILITOT 0.6 0.6  PROT 7.5 6.8  ALBUMIN 3.4* 2.9*   No results for input(s): "LIPASE", "AMYLASE" in the last 168 hours. No results for input(s): "AMMONIA" in the last 168 hours.  ABG    Component Value Date/Time   HCO3 25.4 09/22/2021 1743   TCO2 29 11/18/2009 1207   O2SAT 41.2 09/22/2021 1743     Coagulation Profile: Recent Labs  Lab 06/16/23 1040  INR 1.2    Cardiac Enzymes: No results for  input(s): "CKTOTAL", "CKMB", "CKMBINDEX", "TROPONINI" in the last 168 hours.  HbA1C: No results found for: "HGBA1C"  CBG: No results for input(s): "GLUCAP" in the last 168 hours.     Critical care time: n/a     Melody Comas, MD [facility] Office: [phone]   See Amion for personal pager PCCM on call pager [phone] until 7pm. Please call Elink 7p-7a. [phone]

## 2023-06-22 NOTE — Progress Notes (Signed)
  Echocardiogram 2D Echocardiogram has been performed.  Leda Roys RDCS 06/22/2023, 3:35 PM

## 2023-06-23 ENCOUNTER — Telehealth: Payer: Self-pay | Admitting: Pulmonary Disease

## 2023-06-23 DIAGNOSIS — A419 Sepsis, unspecified organism: Secondary | ICD-10-CM | POA: Diagnosis not present

## 2023-06-23 DIAGNOSIS — E876 Hypokalemia: Secondary | ICD-10-CM | POA: Diagnosis not present

## 2023-06-23 DIAGNOSIS — D860 Sarcoidosis of lung: Secondary | ICD-10-CM | POA: Diagnosis not present

## 2023-06-23 DIAGNOSIS — J189 Pneumonia, unspecified organism: Secondary | ICD-10-CM | POA: Diagnosis not present

## 2023-06-23 MED ORDER — MELATONIN 3 MG PO TABS: 3.0000 mg | ORAL_TABLET | Freq: Every day | ORAL | 0 refills | Status: AC

## 2023-06-23 MED ORDER — PREDNISONE 20 MG PO TABS: 20.0000 mg | ORAL_TABLET | Freq: Every day | ORAL | Status: DC

## 2023-06-23 MED ORDER — PREDNISONE 5 MG PO TABS: ORAL_TABLET | ORAL | 0 refills | Status: AC

## 2023-06-23 MED ORDER — HYDROCODONE BIT-HOMATROP MBR 5-1.5 MG/5ML PO SOLN: 5.0000 mL | ORAL | 0 refills | Status: AC | PRN

## 2023-06-23 MED ORDER — PREDNISONE 20 MG PO TABS: 40.0000 mg | ORAL_TABLET | Freq: Two times a day (BID) | ORAL | Status: DC

## 2023-06-23 MED ORDER — PREDNISONE 20 MG PO TABS: 30.0000 mg | ORAL_TABLET | Freq: Every day | ORAL | Status: DC

## 2023-06-23 MED ORDER — PANTOPRAZOLE SODIUM 40 MG PO TBEC: 40.0000 mg | DELAYED_RELEASE_TABLET | Freq: Every day | ORAL | 3 refills | Status: AC

## 2023-06-23 MED ORDER — ALBUTEROL SULFATE (2.5 MG/3ML) 0.083% IN NEBU: 2.5000 mg | INHALATION_SOLUTION | RESPIRATORY_TRACT | 2 refills | Status: AC | PRN

## 2023-06-23 MED ORDER — FLUTICASONE-SALMETEROL 250-50 MCG/ACT IN AEPB: 1.0000 | INHALATION_SPRAY | Freq: Two times a day (BID) | RESPIRATORY_TRACT | 3 refills | Status: AC

## 2023-06-23 MED ORDER — GUAIFENESIN ER 600 MG PO TB12: 600.0000 mg | ORAL_TABLET | Freq: Two times a day (BID) | ORAL | 0 refills | Status: AC

## 2023-06-23 MED ORDER — PREDNISONE 10 MG PO TABS: 10.0000 mg | ORAL_TABLET | Freq: Every day | ORAL | Status: DC

## 2023-06-23 MED ORDER — PREDNISONE 5 MG PO TABS: 5.0000 mg | ORAL_TABLET | Freq: Every day | ORAL | Status: DC

## 2023-06-23 MED ORDER — BENZONATATE 200 MG PO CAPS: 200.0000 mg | ORAL_CAPSULE | Freq: Three times a day (TID) | ORAL | 0 refills | Status: AC | PRN

## 2023-06-23 MED ORDER — PREDNISONE 20 MG PO TABS: 40.0000 mg | ORAL_TABLET | Freq: Every day | ORAL | Status: DC

## 2023-06-23 NOTE — Progress Notes (Signed)
NAME:  Lauren Russo, MRN:  [id number], DOB:  1971/05/05, LOS: 7 ADMISSION DATE:  06/16/2023, CONSULTATION DATE:  06/19/23 REFERRING MD:  Dr. Isidoro Donning, CHIEF COMPLAINT:  SOB   History of Present Illness:   52 year old female with PMH significant for pulmonary sarcoidosis, former smoker (quit 2013), HTN, and GERD who presented with worsening SOB and cough.  Reports was in her normal state of health prior developing chills on Saturday which was followed by progressive cough and shortness of breath.  Cough productive with intermittent bright red blood streaked, yellow and at times clear.  Some wheezing but otherwise denied any sinus congestion or drainage, reflux symptoms, or LE swelling.  She is not on blood thinners.  She runs a daycare center.   Pt previously followed in our office by Dr. Marchelle Gearing, last seen in 01/2020, since lost to follow-up.  Initial diagnosis in 2013 by biopsy with low ACE levels.  PFTs 2014 w/ FVC 96%, DLCO 68% corrects to 100% for [facility].  Last required hospitalization with steroid use in 2022, otherwise has been managing well and follows with her PCP.   Initially reported by triage to have some desaturation on ambulation, but since has not required supplemental O2.   WBC 15, SARS, flu, and RSV negative, urine strep negative, CXR showed focal opacity at right base, and CTA PE revealed a dense RML pna, negative for PE, and showed borderline enlarged 1 cm subcarinal node.  She was admitted to [facility] with sepsis secondary to CAP, placed on solumedrol since converted to oral prednisone, bronchodilators, and antibiotic coverage. Reports was feeling better but this morning she went to the bathroom and on the way back had a coughing spell, was unable to catch her breath, and continues to feel bad.  Cough remains intermittently blood streaked vs yellow vs clear.  Reports symptom improvement with nebulizers.  Remains afebrile and on room air.  Pulmonary consulted for further recommendations and  concern for sarcoid flare.    Pertinent  Medical History  Former smoker, Pulmonary sarcoidosis, HTN, SVT, GERD, ovarian cyst  Significant Hospital Events: Including procedures, antibiotic start and stop dates in addition to other pertinent events   7/30 admitted to [facility] 8/2 PCCM consulted, CXR with dense RLL opacity and small right effusion  Interim History / Subjective:   No acute events overnight Patient walking well Continues to have intermittent cough  Objective   Blood pressure (!) 141/82, pulse (!) 57, temperature 98 F (36.7 C), temperature source Oral, resp. rate 18, height 4\' 11"  (1.499 m), weight 86.7 kg, SpO2 95%.        Intake/Output Summary (Last 24 hours) at 06/23/2023 9528 Last data filed at 06/22/2023 1450 Gross per 24 hour  Intake 600 ml  Output --  Net 600 ml   Filed Weights   06/16/23 1722  Weight: 86.7 kg   Examination: General: no acute distress, resting in bed, intermittently coughing HEENT: [place]/AT, moist mucous membranes, sclera anicteric Neuro: A&O x 3, moving all extremities CV: rrr, s1s2, no murmurs PULM: no wheezing, rales right base, improved right lung air movement GI: soft, non-tender, non-distended, BS+ Extremities: warm, no edema Skin: no rashes   Resolved Hospital Problem list    Assessment & Plan:  Community Acquired pneumonia, RML/RLL -urine strep neg, SARS/ Flu/ RSV neg, Resp Viral panel negative Mild hemoptysis - CTA PE neg for PE Small Right Pleural Effusion Hx of Pulmonary Sarcoidosis - no progressive disease on recent CT  P:  - reassuring  that pt not requiring supplemental O2 - Completed CAP coverage w/ azithro and ceftriaxone 8/5 - Continue prednisone 40mg  BID, extended prednisone taper entered - continue yupelri, brovanna, pulmicort with prn albuterol while inpatient - Outpatient inhaler regimen: advair diskus or wixella 250-69mcg 1 puff twice daily - Outpatient nebulizer regimen: PRN duonebs - hycodan for cough  suppression - cont pulmonary hygiene: hypertonic nebs with flutter valve, IS, mucinex, mobilize  - cont PPI - Intermitten lasix per primary team - f/u echo results  I have arranged for follow up in the pulmonary clinic. She is safe for discharge per respiratory standpoint. PCCM will sign off.  Best Practice (right click and "Reselect all SmartList Selections" daily)  Per primary team  Labs   CBC: Recent Labs  Lab 06/16/23 1040 06/17/23 0418 06/18/23 0849 06/20/23 0450 06/21/23 0448 06/22/23 0337 06/23/23 0349  WBC 15.0*   < > 17.7* 17.1* 21.7* 23.0* 24.3*  NEUTROABS 12.7*  --   --  11.3*  --   --   --   HGB 12.1   < > 10.9* 10.7* 11.5* 12.2 12.6  HCT 36.4   < > 33.8* 33.1* 35.0* 37.5 38.0  MCV 87.3   < > 90.4 89.9 87.3 88.0 88.0  PLT 260   < > 267 326 424* 500* 518*   < > = values in this interval not displayed.    Basic Metabolic Panel: Recent Labs  Lab 06/18/23 0849 06/19/23 0400 06/21/23 0448 06/22/23 0337 06/23/23 0349  NA 139 138 134* 135 136  K 3.0* 3.4* 4.6 4.4 4.1  CL 106 106 101 96* 96*  CO2 22 24 23 28 28   GLUCOSE 127* 105* 130* 152* 142*  BUN 16 16 22* 22* 25*  CREATININE 0.77 0.75 0.63 0.75 0.80  CALCIUM 8.0* 8.0* 9.3 9.4 9.5  MG  --  2.2  --   --   --    GFR: Estimated Creatinine Clearance: 80.5 mL/min (by C-G formula based on SCr of 0.8 mg/dL). Recent Labs  Lab 06/16/23 1100 06/16/23 1315 06/16/23 1748 06/17/23 0418 06/20/23 0450 06/21/23 0448 06/22/23 0337 06/23/23 0349  WBC  --   --   --    < > 17.1* 21.7* 23.0* 24.3*  LATICACIDVEN 2.1* 3.3* 1.5  --   --   --   --   --    < > = values in this interval not displayed.    Liver Function Tests: Recent Labs  Lab 06/16/23 1040 06/17/23 0418  AST 39 20  ALT 40 32  ALKPHOS 85 76  BILITOT 0.6 0.6  PROT 7.5 6.8  ALBUMIN 3.4* 2.9*   No results for input(s): "LIPASE", "AMYLASE" in the last 168 hours. No results for input(s): "AMMONIA" in the last 168 hours.  ABG    Component Value  Date/Time   HCO3 25.4 09/22/2021 1743   TCO2 29 11/18/2009 1207   O2SAT 41.2 09/22/2021 1743     Coagulation Profile: Recent Labs  Lab 06/16/23 1040  INR 1.2    Cardiac Enzymes: No results for input(s): "CKTOTAL", "CKMB", "CKMBINDEX", "TROPONINI" in the last 168 hours.  HbA1C: No results found for: "HGBA1C"  CBG: No results for input(s): "GLUCAP" in the last 168 hours.     Critical care time: n/a     Melody Comas, MD [place] Office: [phone]   See Amion for personal pager PCCM on call pager [phone] until 7pm. Please call Elink 7p-7a. [phone]

## 2023-06-23 NOTE — Discharge Summary (Signed)
Physician Discharge Summary   Patient: Lauren Russo MRN: [id number] DOB: January 17, 1971  Admit date:     06/16/2023  Discharge date: 06/23/23  Discharge Physician: Thad Ranger, MD    PCP: Ileana Ladd, MD (Inactive)   Recommendations at discharge:   Start fluticasone-Salmeterol [facility]) 250/51 puff twice daily Prednisone taper as outlined below in AVS  Discharge Diagnoses:    CAP (community acquired pneumonia) Sepsis with lactic acidosis   Pulmonary sarcoidosis (HCC)   GERD with stricture   Essential hypertension   Class 2 obesity    Hospital Course:  Patient is a 52 year old female with hypertension, ovarian cyst, pulmonary sarcoidosis, SVT, GERD presented to ED with progressively worse dyspnea for last 3 days.  She also reported productive cough of greenish sputum and mild hemoptysis.  She also desaturated on ambulation.  Reported fatigue, malaise, myalgias otherwise no sick contacts or travel history, no fever chills or rhinorrhea.   Lactic acid 2.1 then 3.3.  Temp of 100.2 F, pulse 102, respiratory rate 12, Chest x-ray showed focal opacity right lung base.  CTA negative for PE, showed right middle lobe pneumonia.  Patient was admitted for further workup.  Assessment and Plan:   Sepsis with CAP (community acquired pneumonia) In the setting of pulmonary sarcoidosis -Patient met sepsis criteria on admission with leukocytosis lactic acidosis, febrile, tachycardia, right middle lobe pneumonia  -CTA chest showed no pulmonary embolism however right middle lobe pneumonia -Pulmonology was consulted and placed on Yupelri, Brovana, Pulmicort with as needed albuterol -Patient was placed on IV Zithromax, Rocephin, has completed course of antibiotics -BNP 131.1, received Lasix 40 mg IV x 2 for possible volume overload, no history of CHF -2D echo showed EF of 60 to 65%, no regional WMA -Per pulmonology, patient to continue prednisone 40 mg twice daily with extended prednisone  taper, wixella 250-50 mcg 1 puff BID -Patient has been feeling better, hoping to go home today.  Has been ambulating.        Lactic acidosis -Resolved     Pulmonary sarcoidosis (HCC) - wants to follow with pulmonology, Dr Francine Graven.  -Office will arrange follow-up   Hypokalemia -Stable   Insomnia -Continue melatonin as needed     GERD with stricture -Continue Protonix   Anxiety -Stable     Essential hypertension -BP stable, continue losartan, HCTZ     Class 2 obesity Estimated body mass index is 38.61 kg/m as calculated from the following:   Height as of this encounter: 4\' 11"  (1.499 m).   Weight as of this encounter: 86.7 kg.       Pain control - [employer] Controlled Substance Reporting System database was reviewed. and patient was instructed, not to drive, operate heavy machinery, perform activities at heights, swimming or participation in water activities or provide baby-sitting services while on Pain, Sleep and Anxiety Medications; until their outpatient Physician has advised to do so again. Also recommended to not to take more than prescribed Pain, Sleep and Anxiety Medications.  Consultants: Pulmonology Procedures performed: 2D echo Disposition: Home Diet recommendation:  Discharge Diet Orders (From admission, onward)     Start     Ordered   06/23/23 0000  Diet - low sodium heart healthy        06/23/23 1028            DISCHARGE MEDICATION: Allergies as of 06/23/2023       Reactions   Doxycycline Diarrhea, Nausea And Vomiting   Azithromycin Rash, Other (See Comments)  palpitations        Medication List     STOP taking these medications    fluconazole 150 MG tablet Commonly known as: DIFLUCAN   ibuprofen 200 MG tablet Commonly known as: ADVIL   metroNIDAZOLE 500 MG tablet Commonly known as: FLAGYL       TAKE these medications    albuterol 108 (90 Base) MCG/ACT inhaler Commonly known as: VENTOLIN HFA Inhale 2 puffs into the  lungs as needed for wheezing. What changed: Another medication with the same name was changed. Make sure you understand how and when to take each.   albuterol (2.5 MG/3ML) 0.083% nebulizer solution Commonly known as: PROVENTIL Take 3 mLs (2.5 mg total) by nebulization every 4 (four) hours as needed for wheezing or shortness of breath. What changed: when to take this   benzonatate 200 MG capsule Commonly known as: TESSALON Take 1 capsule (200 mg total) by mouth 3 (three) times daily as needed for cough.   clobetasol cream 0.05 % Commonly known as: TEMOVATE Apply 1 Application topically daily as needed (eczema).   fluticasone-salmeterol 250-50 MCG/ACT Aepb Commonly known as: Wixela Inhub Inhale 1 puff into the lungs in the morning and at bedtime.   guaiFENesin 600 MG 12 hr tablet Commonly known as: MUCINEX Take 1 tablet (600 mg total) by mouth 2 (two) times daily.   HYDROcodone bit-homatropine 5-1.5 MG/5ML syrup Commonly known as: HYCODAN Take 5 mLs by mouth every 4 (four) hours as needed for cough.   losartan-hydrochlorothiazide 50-12.5 MG tablet Commonly known as: HYZAAR Take 1 tablet by mouth daily.   melatonin 3 MG Tabs tablet Take 1 tablet (3 mg total) by mouth at bedtime. Also available over-the-counter.   pantoprazole 40 MG tablet Commonly known as: PROTONIX Take 1 tablet (40 mg total) by mouth daily. What changed:  medication strength how much to take   predniSONE 5 MG tablet Commonly known as: DELTASONE Take 8 tablets (40 mg total) by mouth 2 (two) times daily with a meal for 2 days, THEN 8 tablets (40 mg total) daily with breakfast for 3 days, THEN 6 tablets (30 mg total) daily with breakfast for 3 days, THEN 4 tablets (20 mg total) daily with breakfast for 3 days, THEN 2 tablets (10 mg total) daily with breakfast for 3 days, THEN 1 tablet (5 mg total) daily with breakfast for 3 days. Start taking on: June 23, 2023   VITAMIN C PO Take 1 tablet by mouth  daily.        Follow-up Information     Martina Sinner, MD Follow up.   Specialty: Pulmonary Disease Why: Office will call you with appointment with Dr. Francine Graven. Contact information: [address] [place] [address] [phone]         Ileana Ladd, MD. Schedule an appointment as soon as possible for a visit in 2 week(s).   Specialty: Family Medicine Why: for hospital follow-up Contact information: [address] [place] [address] [phone]                Discharge Exam: Ceasar Mons Weights   06/16/23 1722  Weight: 86.7 kg   S: Feeling a lot better today, wants to go home.  Cleared by pulmonology.  No fevers or chills, states she is ambulating without difficulty.  BP (!) 141/82 (BP Location: Right Arm)   Pulse (!) 137 Comment: Patient walking. Not a resting rate.  Temp 98 F (36.7 C) (Oral)   Resp  18   Ht 4\' 11"  (1.499 m)   Wt 86.7 kg   SpO2 100%   BMI 38.61 kg/m   Physical Exam General: Alert and oriented x 3, NAD, still intermittently coughing Cardiovascular: S1 S2 clear, RRR.  Respiratory: CTAB, no wheezing Gastrointestinal: Soft, nontender, nondistended, NBS Ext: no pedal edema bilaterally Neuro: no new deficits Psych: Normal affect    Condition at discharge: fair  The results of significant diagnostics from this hospitalization (including imaging, microbiology, ancillary and laboratory) are listed below for reference.   Imaging Studies: ECHOCARDIOGRAM COMPLETE  Result Date: 06/22/2023    ECHOCARDIOGRAM REPORT   Patient Name:   YULA ROSTRON Date of Exam: 06/22/2023 Medical Rec #:  [id number]           Height:       59.0 in Accession #:    [id number]          Weight:       191.1 lb Date of Birth:  05/10/1971           BSA:          1.809 m Patient Age:    52 years            BP:           123/88 mmHg Patient Gender: F                   HR:           75 bpm. Exam Location:  Inpatient Procedure: 2D Echo,  Cardiac Doppler and Color Doppler Indications:    I50.31 Acute diastolic (congestive) heart failure  History:        Patient has prior history of Echocardiogram examinations, most                 recent 12/10/2014. Risk Factors:Hypertension.  Sonographer:    Harriette Bouillon RDCS Referring Phys: [id number]  K  IMPRESSIONS  1. Windows are challenging. Left ventricular ejection fraction, by estimation, is 60 to 65%. The left ventricle has normal function. The left ventricle has no regional wall motion abnormalities. Left ventricular diastolic parameters are indeterminate.  2. Right ventricular systolic function is normal. The right ventricular size is normal. Tricuspid regurgitation signal is inadequate for assessing PA pressure.  3. The mitral valve was not well visualized. No evidence of mitral valve regurgitation.  4. The aortic valve was not well visualized. Aortic valve regurgitation is not visualized.  5. The inferior vena cava is normal in size with greater than 50% respiratory variability, suggesting right atrial pressure of 3 mmHg. FINDINGS  Left Ventricle: Windows are challenging. Left ventricular ejection fraction, by estimation, is 60 to 65%. The left ventricle has normal function. The left ventricle has no regional wall motion abnormalities. The left ventricular internal cavity size was  normal in size. There is no left ventricular hypertrophy. Left ventricular diastolic parameters are indeterminate. Right Ventricle: The right ventricular size is normal. Right ventricular systolic function is normal. Tricuspid regurgitation signal is inadequate for assessing PA pressure. Left Atrium: Left atrial size was normal in size. Right Atrium: Right atrial size was normal in size. Pericardium: Trivial pericardial effusion is present. Mitral Valve: The mitral valve was not well visualized. No evidence of mitral valve regurgitation. Tricuspid Valve: Tricuspid valve regurgitation is not demonstrated. Aortic Valve:  The aortic valve was not well visualized. Aortic valve regurgitation is not visualized. Pulmonic Valve: Pulmonic valve regurgitation is not visualized. Aorta: The aortic root and ascending  aorta are structurally normal, with no evidence of dilitation. Venous: The inferior vena cava is normal in size with greater than 50% respiratory variability, suggesting right atrial pressure of 3 mmHg. IAS/Shunts: The interatrial septum was not well visualized.  LEFT VENTRICLE PLAX 2D LVIDd:         4.90 cm   Diastology LVIDs:         3.10 cm   LV e' medial:  4.90 cm/s LV PW:         0.80 cm   LV e' lateral: 8.92 cm/s LV IVS:        0.80 cm LVOT diam:     2.00 cm LV SV:         40 LV SV Index:   22 LVOT Area:     3.14 cm  RIGHT VENTRICLE RV S prime:     12.40 cm/s LEFT ATRIUM             Index        RIGHT ATRIUM          Index LA diam:        3.00 cm 1.66 cm/m   RA Area:     8.88 cm LA Vol (A2C):   25.5 ml 14.09 ml/m  RA Volume:   16.30 ml 9.01 ml/m LA Vol (A4C):   32.7 ml 18.07 ml/m LA Biplane Vol: 30.1 ml 16.64 ml/m  AORTIC VALVE LVOT Vmax:   77.60 cm/s LVOT Vmean:  46.700 cm/s LVOT VTI:    0.127 m  AORTA Ao Root diam: 2.80 cm Ao Asc diam:  2.90 cm  SHUNTS Systemic VTI:  0.13 m Systemic Diam: 2.00 cm Carolan Clines Electronically signed by Carolan Clines Signature Date/Time: 06/22/2023/3:53:01 PM    Final    DG Chest 2 View  Result Date: 06/19/2023 CLINICAL DATA:  Productive cough.  Community-acquired pneumonia. EXAM: CHEST - 2 VIEW COMPARISON:  06/16/2023 FINDINGS: Right base collapse/consolidation with small right pleural effusion again noted. Left lung clear. Cardiopericardial silhouette is at upper limits of normal for size. No acute bony abnormality. IMPRESSION: Persistent right base collapse/consolidation with small right pleural effusion. Electronically Signed   By: Kennith Center M.D.   On: 06/19/2023 13:01   CT Angio Chest PE W and/or Wo Contrast  Result Date: 06/16/2023 CLINICAL DATA:  Pulmonary embolism (PE)  suspected, high prob EXAM: CT ANGIOGRAPHY CHEST WITH CONTRAST TECHNIQUE: Multidetector CT imaging of the chest was performed using the standard protocol during bolus administration of intravenous contrast. Multiplanar CT image reconstructions and MIPs were obtained to evaluate the vascular anatomy. RADIATION DOSE REDUCTION: This exam was performed according to the departmental dose-optimization program which includes automated exposure control, adjustment of the mA and/or kV according to patient size and/or use of iterative reconstruction technique. CONTRAST:  75mL OMNIPAQUE IOHEXOL 350 MG/ML SOLN COMPARISON:  09/23/2021 FINDINGS: Cardiovascular: SVC patent. Heart size normal. No pericardial effusion. RV is nondilated. Satisfactory opacification of pulmonary arteries noted, and there is no evidence of pulmonary emboli. Adequate contrast opacification of the thoracic aorta with no evidence of dissection, aneurysm, or stenosis. There is bovine variant brachiocephalic arch anatomy without proximal stenosis. Mediastinum/Nodes: Borderline enlarged 1 cm subcarinal node. Subcentimeter right paratracheal and hilar lymph nodes. Lungs/Pleura: Airspace consolidation in the right middle lobe with air bronchograms, new since previous. Linear scarring or atelectasis laterally and posteriorly in both lower lobes as before. No pneumothorax or effusion. Upper Abdomen: No acute findings. Musculoskeletal: No chest wall abnormality. No acute or significant  osseous findings. Review of the MIP images confirms the above findings. IMPRESSION: 1. Negative for acute PE or thoracic aortic dissection. 2. Right middle lobe pneumonia. 3. Borderline enlarged subcarinal node. Electronically Signed   By: Corlis Leak M.D.   On: 06/16/2023 13:00   DG Chest Port 1 View  Result Date: 06/16/2023 CLINICAL DATA:  Sepsis EXAM: PORTABLE CHEST 1 VIEW COMPARISON:  X-ray 09/22/2021 FINDINGS: Underinflation. No pneumothorax or effusion. No edema. Focal  opacity right lung base. Subtle infiltrates possible. Normal cardiopericardial silhouette. Overlapping cardiac leads. IMPRESSION: Focal opacity right lung base.  Underinflation.  Recommend follow-up Electronically Signed   By: Karen Kays M.D.   On: 06/16/2023 11:49    Microbiology: Results for orders placed or performed during the hospital encounter of 06/16/23  Blood Culture (routine x 2)     Status: None   Collection Time: 06/16/23 10:47 AM   Specimen: BLOOD LEFT FOREARM  Result Value Ref Range Status   Specimen Description   Final    BLOOD LEFT FOREARM Performed at [facility], [address], [place], [place] [address]    Special Requests   Final    BOTTLES DRAWN AEROBIC AND ANAEROBIC Blood Culture adequate volume Performed at [facility], [address] [place], [place] [address]    Culture   Final    NO GROWTH 5 DAYS Performed at [facility], [address] [place], [place] [address]    Report Status 06/21/2023 FINAL  Final  Resp panel by RT-PCR (RSV, Flu A&B, Covid)     Status: None   Collection Time: 06/16/23 11:00 AM   Specimen: Nasal Swab  Result Value Ref Range Status   SARS Coronavirus 2 by RT PCR NEGATIVE NEGATIVE Final    Comment: (NOTE) SARS-CoV-2 target nucleic acids are NOT DETECTED.  The SARS-CoV-2 RNA is generally detectable in upper respiratory specimens during the acute phase of infection. The lowest concentration of SARS-CoV-2 viral copies this assay can detect is 138 copies/mL. A negative result does not preclude SARS-Cov-2 infection and should not be used as the sole basis for treatment or other patient management decisions. A negative result may occur with  improper specimen collection/handling, submission of specimen other than nasopharyngeal swab, presence of viral mutation(s) within the areas targeted by this assay, and inadequate number of viral copies(<138 copies/mL). A negative result must be  combined with clinical observations, patient history, and epidemiological information. The expected result is Negative.  Fact Sheet for Patients:  BloggerCourse.com  Fact Sheet for Healthcare Providers:  SeriousBroker.it  This test is no t yet approved or cleared by the [place] FDA and  has been authorized for detection and/or diagnosis of SARS-CoV-2 by FDA under an Emergency Use Authorization (EUA). This EUA will remain  in effect (meaning this test can be used) for the duration of the COVID-19 declaration under Section 564(b)(1) of the Act, 21 U.S.C.section 360bbb-3(b)(1), unless the authorization is terminated  or revoked sooner.       Influenza A by PCR NEGATIVE NEGATIVE Final   Influenza B by PCR NEGATIVE NEGATIVE Final    Comment: (NOTE) The Xpert Xpress SARS-CoV-2/FLU/RSV plus assay is intended as an aid in the diagnosis of influenza from Nasopharyngeal swab specimens and should not be used as a sole basis for treatment. Nasal washings and aspirates are unacceptable for Xpert Xpress SARS-CoV-2/FLU/RSV testing.  Fact Sheet for Patients: BloggerCourse.com  Fact Sheet for Healthcare Providers: SeriousBroker.it  This test is not yet  approved or cleared by the [place] and has been authorized for detection and/or diagnosis of SARS-CoV-2 by FDA under an Emergency Use Authorization (EUA). This EUA will remain in effect (meaning this test can be used) for the duration of the COVID-19 declaration under Section 564(b)(1) of the Act, 21 U.S.C. section 360bbb-3(b)(1), unless the authorization is terminated or revoked.     Resp Syncytial Virus by PCR NEGATIVE NEGATIVE Final    Comment: (NOTE) Fact Sheet for Patients: BloggerCourse.com  Fact Sheet for Healthcare Providers: SeriousBroker.it  This test is not yet  approved or cleared by the [place] FDA and has been authorized for detection and/or diagnosis of SARS-CoV-2 by FDA under an Emergency Use Authorization (EUA). This EUA will remain in effect (meaning this test can be used) for the duration of the COVID-19 declaration under Section 564(b)(1) of the Act, 21 U.S.C. section 360bbb-3(b)(1), unless the authorization is terminated or revoked.  Performed at [facility], [address], [place], [place] [address]   Blood Culture (routine x 2)     Status: None   Collection Time: 06/16/23 11:05 AM   Specimen: Right Antecubital; Blood  Result Value Ref Range Status   Specimen Description   Final    RIGHT ANTECUBITAL Performed at [facility], [address] [place], [place] [address]    Special Requests   Final    BOTTLES DRAWN AEROBIC AND ANAEROBIC Blood Culture results may not be optimal due to an excessive volume of blood received in culture bottles Performed at [facility], [address], [place], [place] [address]    Culture   Final    NO GROWTH 5 DAYS Performed at [facility], [address] [place], [place] [address]    Report Status 06/21/2023 FINAL  Final  Respiratory (~20 pathogens) panel by PCR     Status: None   Collection Time: 06/19/23 12:47 PM   Specimen: Nasopharyngeal Swab; Respiratory  Result Value Ref Range Status   Adenovirus NOT DETECTED NOT DETECTED Final   Coronavirus 229E NOT DETECTED NOT DETECTED Final    Comment: (NOTE) The Coronavirus on the Respiratory Panel, DOES NOT test for the novel  Coronavirus (2019 nCoV)    Coronavirus HKU1 NOT DETECTED NOT DETECTED Final   Coronavirus NL63 NOT DETECTED NOT DETECTED Final   Coronavirus OC43 NOT DETECTED NOT DETECTED Final   Metapneumovirus NOT DETECTED NOT DETECTED Final   Rhinovirus / Enterovirus NOT DETECTED NOT DETECTED Final   Influenza A NOT DETECTED NOT DETECTED Final   Influenza B NOT  DETECTED NOT DETECTED Final   Parainfluenza Virus 1 NOT DETECTED NOT DETECTED Final   Parainfluenza Virus 2 NOT DETECTED NOT DETECTED Final   Parainfluenza Virus 3 NOT DETECTED NOT DETECTED Final   Parainfluenza Virus 4 NOT DETECTED NOT DETECTED Final   Respiratory Syncytial Virus NOT DETECTED NOT DETECTED Final   Bordetella pertussis NOT DETECTED NOT DETECTED Final   Bordetella Parapertussis NOT DETECTED NOT DETECTED Final   Chlamydophila pneumoniae NOT DETECTED NOT DETECTED Final   Mycoplasma pneumoniae NOT DETECTED NOT DETECTED Final    Comment: Performed at [facility], [address] [place], [place] [address]    Labs: CBC: Recent Labs  Lab 06/18/23 [phone] 06/20/23 0450 06/21/23 0448 06/22/23 0337 06/23/23 0349  WBC 17.7* 17.1* 21.7* 23.0* 24.3*  NEUTROABS  --  11.3*  --   --   --   HGB 10.9* 10.7* 11.5* 12.2 12.6  HCT 33.8* 33.1* 35.0* 37.5 38.0  MCV 90.4 89.9 87.3 88.0 88.0  PLT 267 326 424* 500* 518*   Basic Metabolic Panel: Recent Labs  Lab 06/18/23 0849 06/19/23 0400 06/21/23 0448 06/22/23 0337 06/23/23 0349  NA 139 138 134* 135 136  K 3.0* 3.4* 4.6 4.4 4.1  CL 106 106 101 96* 96*  CO2 22 24 23 28 28   GLUCOSE 127* 105* 130* 152* 142*  BUN 16 16 22* 22* 25*  CREATININE 0.77 0.75 0.63 0.75 0.80  CALCIUM 8.0* 8.0* 9.3 9.4 9.5  MG  --  2.2  --   --   --    Liver Function Tests: Recent Labs  Lab 06/17/23 0418  AST 20  ALT 32  ALKPHOS 76  BILITOT 0.6  PROT 6.8  ALBUMIN 2.9*   CBG: No results for input(s): "GLUCAP" in the last 168 hours.  Discharge time spent: greater than 30 minutes.  Signed: Thad Ranger, MD [facility] 06/23/2023

## 2023-06-23 NOTE — Telephone Encounter (Signed)
Patient scheduled for HFU on 9/4 at 11:30 per JD request. Reminder mailed.

## 2023-06-23 NOTE — Plan of Care (Signed)
  Problem: Activity: Goal: Ability to tolerate increased activity will improve Outcome: Progressing   Problem: Clinical Measurements: Goal: Ability to maintain a body temperature in the normal range will improve Outcome: Progressing   Problem: Respiratory: Goal: Ability to maintain adequate ventilation will improve Outcome: Progressing   Problem: Education: Goal: Knowledge of General Education information will improve Description: Including pain rating scale, medication(s)/side effects and non-pharmacologic comfort measures Outcome: Progressing   Problem: Pain Managment: Goal: General experience of comfort will improve Outcome: Progressing   Problem: Safety: Goal: Ability to remain free from injury will improve Outcome: Progressing   Problem: Skin Integrity: Goal: Risk for impaired skin integrity will decrease Outcome: Progressing

## 2023-06-23 NOTE — Telephone Encounter (Signed)
Please schedule patient for hospital follow up with me on 9/4 for pneumonia. Ok to use a blocked slot.  Thanks, JD

## 2023-07-22 ENCOUNTER — Encounter: Payer: Self-pay | Admitting: Pulmonary Disease

## 2023-07-22 ENCOUNTER — Ambulatory Visit: Payer: Commercial Managed Care - HMO | Admitting: Pulmonary Disease

## 2023-07-22 VITALS — BP 130/84 | HR 93 | Temp 98.4°F | Ht 59.0 in | Wt 186.0 lb

## 2023-07-22 DIAGNOSIS — J189 Pneumonia, unspecified organism: Secondary | ICD-10-CM | POA: Diagnosis not present

## 2023-07-22 NOTE — Patient Instructions (Signed)
Continue to use nebulizer treatments as needed  We will check a chest x-ray at next visit  Follow up in 2 months

## 2023-07-22 NOTE — Progress Notes (Signed)
Synopsis: Referred in September 2024 for hospital follow up of pneumonia  Subjective:   PATIENT ID: Lauren Russo GENDER: female DOB: 1971/08/13, MRN: [id number]  HPI  Chief Complaint  Patient presents with   Hospitalization Follow-up    Doing well.  Does have pain in upper back area.  Saw PCP, told it may be how she is sleeping and due to her mattress.   Lauren Russo is a 52 year old woman, former smoker with history of sarcoidosis, HTN and GERD who is referred to pulmonary clinic for hospital follow up of pneumonia.   He was admitted 7/30 to 8/6 for community-acquired pneumonia and reactive airways disease.  She completed course of antibiotics and prolonged steroid taper.  She was prescribed Advair inhaler but never started it due to significant improvement in her breathing with steroid taper.  She denies any current, shortness of breath, wheezing, cough or sputum production.  He feels she is back to baseline at this time.  Pt previously followed in our office by Dr. Marchelle Gearing, last seen in 01/2020, since lost to follow-up.  Initial diagnosis in 2012-10-22 by biopsy with low ACE levels.  PFTs 2014 w/ FVC 96%, DLCO 68% corrects to 100% for [facility].  Last required hospitalization with steroid use in Oct 22, 2021, otherwise has been managing well and follows with her PCP.    She runs a daycare center.   Past Medical History:  Diagnosis Date   Hypertension    Ovarian cyst    Pulmonary nodule    Sarcoid    SVT (supraventricular tachycardia)    Tubal pregnancy      Family History  Problem Relation Age of Onset   Heart disease Mother    Heart disease Father    Breast cancer Maternal Aunt      Social History   Socioeconomic History   Marital status: Widowed    Spouse name: Not on file   Number of children: Not on file   Years of education: Not on file   Highest education level: Some college, no degree  Occupational History   Occupation: Childcare provider  Tobacco Use   Smoking  status: Former    Types: Cigarettes    Start date: 02/28/2012    Passive exposure: Yes   Smokeless tobacco: Never   Tobacco comments:    reports smokes socially x 8 months when husband passed away in 10-22-12.  Substance and Sexual Activity   Alcohol use: Yes    Alcohol/week: 2.0 standard drinks of alcohol    Types: 2 Glasses of wine per week    Comment: Occassional   Drug use: No   Sexual activity: Not Currently    Birth control/protection: Surgical    Comment: no sex in 3 years  Other Topics Concern   Not on file  Social History Narrative   Not on file   Social Determinants of Health   Financial Resource Strain: Not on file  Food Insecurity: No Food Insecurity (06/16/2023)   Hunger Vital Sign    Worried About Running Out of Food in the Last Year: Never true    Ran Out of Food in the Last Year: Never true  Transportation Needs: No Transportation Needs (06/16/2023)   PRAPARE - Administrator, Civil Service (Medical): No    Lack of Transportation (Non-Medical): No  Physical Activity: Not on file  Stress: Not on file  Social Connections: Not on file  Intimate Partner Violence: Not At Risk (06/16/2023)   Humiliation,  Afraid, Rape, and Kick questionnaire    Fear of Current or Ex-Partner: No    Emotionally Abused: No    Physically Abused: No    Sexually Abused: No     Allergies  Allergen Reactions   Doxycycline Diarrhea and Nausea And Vomiting   Azithromycin Rash and Other (See Comments)    palpitations     Outpatient Medications Prior to Visit  Medication Sig Dispense Refill   albuterol (PROVENTIL) (2.5 MG/3ML) 0.083% nebulizer solution Take 3 mLs (2.5 mg total) by nebulization every 4 (four) hours as needed for wheezing or shortness of breath. 75 mL 2   albuterol (VENTOLIN HFA) 108 (90 Base) MCG/ACT inhaler Inhale 2 puffs into the lungs as needed for wheezing.     Ascorbic Acid (VITAMIN C PO) Take 1 tablet by mouth daily.     benzonatate (TESSALON) 200 MG  capsule Take 1 capsule (200 mg total) by mouth 3 (three) times daily as needed for cough. 90 capsule 0   clobetasol cream (TEMOVATE) 0.05 % Apply 1 Application topically daily as needed (eczema).     fluticasone-salmeterol (WIXELA INHUB) 250-50 MCG/ACT AEPB Inhale 1 puff into the lungs in the morning and at bedtime. 60 each 3   guaiFENesin (MUCINEX) 600 MG 12 hr tablet Take 1 tablet (600 mg total) by mouth 2 (two) times daily. 30 tablet 0   HYDROcodone bit-homatropine (HYCODAN) 5-1.5 MG/5ML syrup Take 5 mLs by mouth every 4 (four) hours as needed for cough. 120 mL 0   losartan-hydrochlorothiazide (HYZAAR) 50-12.5 MG tablet Take 1 tablet by mouth daily.     melatonin 3 MG TABS tablet Take 1 tablet (3 mg total) by mouth at bedtime. Also available over-the-counter. 30 tablet 0   pantoprazole (PROTONIX) 40 MG tablet Take 1 tablet (40 mg total) by mouth daily. 30 tablet 3   No facility-administered medications prior to visit.    Review of Systems  Constitutional:  Negative for chills, fever, malaise/fatigue and weight loss.  HENT:  Negative for congestion, sinus pain and sore throat.   Eyes: Negative.   Respiratory:  Negative for cough, hemoptysis, sputum production, shortness of breath and wheezing.   Cardiovascular:  Negative for chest pain, palpitations, orthopnea, claudication and leg swelling.  Gastrointestinal:  Negative for abdominal pain, heartburn, nausea and vomiting.  Genitourinary: Negative.   Musculoskeletal:  Negative for joint pain and myalgias.  Skin:  Negative for rash.  Neurological:  Negative for weakness.  Endo/Heme/Allergies: Negative.   Psychiatric/Behavioral: Negative.        Objective:   Vitals:   07/22/23 1608  BP: 130/84  Pulse: 93  Temp: 98.4 F (36.9 C)  TempSrc: Oral  SpO2: 99%  Weight: 186 lb (84.4 kg)  Height: 4\' 11"  (1.499 m)     Physical Exam Constitutional:      General: She is not in acute distress.    Appearance: Normal appearance.  Eyes:      General: No scleral icterus.    Conjunctiva/sclera: Conjunctivae normal.  Cardiovascular:     Rate and Rhythm: Normal rate and regular rhythm.  Pulmonary:     Breath sounds: No wheezing, rhonchi or rales.  Musculoskeletal:     Right lower leg: No edema.     Left lower leg: No edema.  Skin:    General: Skin is warm and dry.  Neurological:     General: No focal deficit present.       CBC    Component Value Date/Time   WBC 24.3 (  H) 06/23/2023 0349   RBC 4.32 06/23/2023 0349   HGB 12.6 06/23/2023 0349   HCT 38.0 06/23/2023 0349   PLT 518 (H) 06/23/2023 0349   MCV 88.0 06/23/2023 0349   MCH 29.2 06/23/2023 0349   MCHC 33.2 06/23/2023 0349   RDW 15.1 06/23/2023 0349   LYMPHSABS 4.1 (H) 06/20/2023 0450   MONOABS 0.7 06/20/2023 0450   EOSABS 0.1 06/20/2023 0450   BASOSABS 0.1 06/20/2023 0450   Chest imaging: CTA Chest 06/16/23 IMPRESSION: 1. Negative for acute PE or thoracic aortic dissection. 2. Right middle lobe pneumonia. 3. Borderline enlarged subcarinal node.  PFT:     No data to display          Labs:  Path:  Echo:  Heart Catheterization:       Assessment & Plan:   Pneumonia of right lower lobe due to infectious organism - Plan: DG Chest 2 View  Discussion: Kawailani Rumer is a 52 year old woman, former smoker with history of sarcoidosis, HTN and GERD who is referred to pulmonary clinic for hospital follow up of pneumonia.   She has made nice recovery since her hospitalization and feels her respiratory status has returned to baseline.  She has no cough, wheezing or shortness of breath at this time.  She has resumed all normal activities.  We will check chest x-ray at next visit for monitoring of radiographic resolution of her pneumonia . Follow-up in 2 months.  Melody Comas, MD [facility] Office: [phone]    Current Outpatient Medications:    albuterol (PROVENTIL) (2.5 MG/3ML) 0.083% nebulizer solution,  Take 3 mLs (2.5 mg total) by nebulization every 4 (four) hours as needed for wheezing or shortness of breath., Disp: 75 mL, Rfl: 2   albuterol (VENTOLIN HFA) 108 (90 Base) MCG/ACT inhaler, Inhale 2 puffs into the lungs as needed for wheezing., Disp: , Rfl:    Ascorbic Acid (VITAMIN C PO), Take 1 tablet by mouth daily., Disp: , Rfl:    benzonatate (TESSALON) 200 MG capsule, Take 1 capsule (200 mg total) by mouth 3 (three) times daily as needed for cough., Disp: 90 capsule, Rfl: 0   clobetasol cream (TEMOVATE) 0.05 %, Apply 1 Application topically daily as needed (eczema)., Disp: , Rfl:    fluticasone-salmeterol (WIXELA INHUB) 250-50 MCG/ACT AEPB, Inhale 1 puff into the lungs in the morning and at bedtime., Disp: 60 each, Rfl: 3   guaiFENesin (MUCINEX) 600 MG 12 hr tablet, Take 1 tablet (600 mg total) by mouth 2 (two) times daily., Disp: 30 tablet, Rfl: 0   HYDROcodone bit-homatropine (HYCODAN) 5-1.5 MG/5ML syrup, Take 5 mLs by mouth every 4 (four) hours as needed for cough., Disp: 120 mL, Rfl: 0   losartan-hydrochlorothiazide (HYZAAR) 50-12.5 MG tablet, Take 1 tablet by mouth daily., Disp: , Rfl:    melatonin 3 MG TABS tablet, Take 1 tablet (3 mg total) by mouth at bedtime. Also available over-the-counter., Disp: 30 tablet, Rfl: 0   pantoprazole (PROTONIX) 40 MG tablet, Take 1 tablet (40 mg total) by mouth daily., Disp: 30 tablet, Rfl: 3

## 2023-08-04 ENCOUNTER — Encounter: Payer: Self-pay | Admitting: Pulmonary Disease

## 2023-09-23 ENCOUNTER — Encounter: Payer: Self-pay | Admitting: Primary Care

## 2023-09-23 ENCOUNTER — Ambulatory Visit (INDEPENDENT_AMBULATORY_CARE_PROVIDER_SITE_OTHER): Payer: Managed Care, Other (non HMO) | Admitting: Primary Care

## 2023-09-23 VITALS — BP 124/64 | HR 92 | Temp 98.5°F | Ht 59.0 in | Wt 198.2 lb

## 2023-09-23 DIAGNOSIS — J189 Pneumonia, unspecified organism: Secondary | ICD-10-CM | POA: Diagnosis not present

## 2023-09-23 MED ORDER — DM-GUAIFENESIN ER 30-600 MG PO TB12: 1.0000 | ORAL_TABLET | Freq: Two times a day (BID) | ORAL | 1 refills | Status: AC | PRN

## 2023-09-23 NOTE — Progress Notes (Signed)
@Patient  ID: Lauren Russo, female    DOB: 1971/10/26, 52 y.o.   MRN: [id number]  Chief Complaint  Patient presents with   Follow-up    Referring provider: Jackelyn Poling, DO  HPI: 52 year old female, smoker.  Past medical history significant for hypertension, mediastinal adenopathy, pulmonary sarcoidosis, community-acquired pneumonia, GERD, obesity. Patient with Dr. Francine Graven, last seen on July 22, 2023  09/23/2023 Discussed the use of AI scribe software for clinical note transcription with the patient, who gave verbal consent to proceed.  History of Present Illness   Patient presents today for a 31-month follow-up regarding right lower lobe pneumonia. She reports persistent coughing up of phlegm, which is sometimes yellow. She had a recent episode of severe throat discomfort, described as 'swallowing glass,' which was treated with amoxicillin-clavulanate, but this was switched to doxycycline due to nausea. The throat discomfort resolved with doxycycline, but it led to a yeast infection. The patient also reports some sinus congestion and diaphoresis.She denies any limitations in her activities due to breathing difficulties.      Allergies  Allergen Reactions   Doxycycline Diarrhea and Nausea And Vomiting   Azithromycin Rash and Other (See Comments)    palpitations     There is no immunization history on file for this patient.  Past Medical History:  Diagnosis Date   Hypertension    Ovarian cyst    Pulmonary nodule    Sarcoid    SVT (supraventricular tachycardia) (HCC)    Tubal pregnancy     Tobacco History: Social History   Tobacco Use  Smoking Status Former   Types: Cigarettes   Start date: 02/28/2012   Passive exposure: Yes  Smokeless Tobacco Never  Tobacco Comments   reports smokes socially x 8 months when husband passed away in 10-14-12.   Counseling given: Not Answered Tobacco comments: reports smokes socially x 8 months when husband passed away in  10-14-12.   Outpatient Medications Prior to Visit  Medication Sig Dispense Refill   albuterol (PROVENTIL) (2.5 MG/3ML) 0.083% nebulizer solution Take 3 mLs (2.5 mg total) by nebulization every 4 (four) hours as needed for wheezing or shortness of breath. 75 mL 2   albuterol (VENTOLIN HFA) 108 (90 Base) MCG/ACT inhaler Inhale 2 puffs into the lungs as needed for wheezing.     Ascorbic Acid (VITAMIN C PO) Take 1 tablet by mouth daily.     benzonatate (TESSALON) 200 MG capsule Take 1 capsule (200 mg total) by mouth 3 (three) times daily as needed for cough. 90 capsule 0   clobetasol cream (TEMOVATE) 0.05 % Apply 1 Application topically daily as needed (eczema).     fluticasone-salmeterol (WIXELA INHUB) 250-50 MCG/ACT AEPB Inhale 1 puff into the lungs in the morning and at bedtime. 60 each 3   guaiFENesin (MUCINEX) 600 MG 12 hr tablet Take 1 tablet (600 mg total) by mouth 2 (two) times daily. 30 tablet 0   HYDROcodone bit-homatropine (HYCODAN) 5-1.5 MG/5ML syrup Take 5 mLs by mouth every 4 (four) hours as needed for cough. 120 mL 0   losartan-hydrochlorothiazide (HYZAAR) 50-12.5 MG tablet Take 1 tablet by mouth daily.     melatonin 3 MG TABS tablet Take 1 tablet (3 mg total) by mouth at bedtime. Also available over-the-counter. 30 tablet 0   pantoprazole (PROTONIX) 40 MG tablet Take 1 tablet (40 mg total) by mouth daily. 30 tablet 3   No facility-administered medications prior to visit.    Review of Systems  Review of Systems  Constitutional:  Positive for diaphoresis. Negative for fever.  HENT:  Positive for congestion.   Respiratory:  Positive for cough. Negative for chest tightness, shortness of breath and wheezing.    Physical Exam  BP 124/64 (BP Location: Right Arm, Patient Position: Sitting, Cuff Size: Normal)   Pulse 92   Temp 98.5 F (36.9 C) (Oral)   Ht 4\' 11"  (1.499 m)   Wt 198 lb 3.2 oz (89.9 kg)   SpO2 99%   BMI 40.03 kg/m  Physical Exam Constitutional:      Appearance:  Normal appearance.  HENT:     Head: Normocephalic and atraumatic.     Right Ear: Tympanic membrane normal. There is no impacted cerumen.     Left Ear: Tympanic membrane normal. There is no impacted cerumen.     Mouth/Throat:     Mouth: Mucous membranes are moist.     Pharynx: Oropharynx is clear. Posterior oropharyngeal erythema present. No oropharyngeal exudate.  Cardiovascular:     Rate and Rhythm: Normal rate and regular rhythm.  Pulmonary:     Effort: Pulmonary effort is normal.     Breath sounds: Normal breath sounds. No wheezing, rhonchi or rales.     Comments: CTA Neurological:     General: No focal deficit present.     Mental Status: She is alert and oriented to person, place, and time. Mental status is at baseline.  Psychiatric:        Mood and Affect: Mood normal.        Behavior: Behavior normal.        Thought Content: Thought content normal.        Judgment: Judgment normal.      Lab Results:  CBC    Component Value Date/Time   WBC 24.3 (H) 06/23/2023 0349   RBC 4.32 06/23/2023 0349   HGB 12.6 06/23/2023 0349   HCT 38.0 06/23/2023 0349   PLT 518 (H) 06/23/2023 0349   MCV 88.0 06/23/2023 0349   MCH 29.2 06/23/2023 0349   MCHC 33.2 06/23/2023 0349   RDW 15.1 06/23/2023 0349   LYMPHSABS 4.1 (H) 06/20/2023 0450   MONOABS 0.7 06/20/2023 0450   EOSABS 0.1 06/20/2023 0450   BASOSABS 0.1 06/20/2023 0450    BMET    Component Value Date/Time   NA 136 06/23/2023 0349   K 4.1 06/23/2023 0349   CL 96 (L) 06/23/2023 0349   CO2 28 06/23/2023 0349   GLUCOSE 142 (H) 06/23/2023 0349   BUN 25 (H) 06/23/2023 0349   CREATININE 0.80 06/23/2023 0349   CALCIUM 9.5 06/23/2023 0349   GFRNONAA >60 06/23/2023 0349   GFRAA >60 07/15/2019 1514    BNP    Component Value Date/Time   BNP 131.1 (H) 06/20/2023 1058    ProBNP No results found for: "PROBNP"  Imaging: No results found.   Assessment & Plan:    1. Pneumonia of right lower lobe due to infectious  organism - DG Chest 2 View; Future - CBC with Differential; Future  Pneumonia Persistent cough and phlegm production, but no shortness of breath or limitation in activities. Lungs sound clear on examination. -Order chest x-ray and lab work tomorrow to ensure resolution of pneumonia and normalization of previously elevated white blood cell count.  Pharyngitis Resolved with antibiotics (Amoxicillin-Clavulanate and Doxycycline), but resulted in a yeast infection. -Completed course of antibiotics.  Sinus Congestion Mild symptoms, possibly related to recent pharyngitis. -Recommend over-the-counter antihistamine (Claritin or Zyrtec), nasal sprays (Flonase or saline nasal rinse), and Mucinex.  Menopause Reports hot flashes. -No intervention discussed at this time.      Glenford Bayley, NP 09/23/2023

## 2023-09-23 NOTE — Patient Instructions (Signed)
VISIT SUMMARY:  You came in today for a follow-up visit regarding your recent pneumonia. You mentioned that you are still experiencing a persistent cough with phlegm and had a severe throat discomfort that was treated with antibiotics. You also reported sinus congestion and hot flashes, which you believe are related to menopause.  YOUR PLAN:  -PNEUMONIA: Pneumonia is an infection that inflames the air sacs in one or both lungs. You are still experiencing a persistent cough and phlegm production, but your lungs sound clear. We will order a chest x-ray and lab work tomorrow to ensure the pneumonia has resolved and your white blood cell count has normalized.  -SINUS CONGESTION: Sinus congestion is the blockage of the nasal passages usually due to inflammation.  We recommend using over-the-counter antihistamines like Claritin or Zyrtec, nasal sprays such as Flonase or saline nasal rinse, and Mucinex to help relieve the congestion.  INSTRUCTIONS:  Please complete the chest x-ray and lab work tomorrow to check the resolution of pneumonia and your white blood cell count. Follow the recommendations for managing sinus congestion and monitor your symptoms. If you have any concerns or if symptoms worsen, please contact our office.

## 2023-09-28 ENCOUNTER — Ambulatory Visit: Payer: Managed Care, Other (non HMO)

## 2023-09-28 DIAGNOSIS — J189 Pneumonia, unspecified organism: Secondary | ICD-10-CM

## 2023-09-28 LAB — CBC WITH DIFFERENTIAL/PLATELET
Basophils Absolute: 0 10*3/uL (ref 0.0–0.1)
Basophils Relative: 0.4 % (ref 0.0–3.0)
Eosinophils Absolute: 0.1 10*3/uL (ref 0.0–0.7)
Eosinophils Relative: 1.6 % (ref 0.0–5.0)
HCT: 38.6 % (ref 36.0–46.0)
Hemoglobin: 12.7 g/dL (ref 12.0–15.0)
Lymphocytes Relative: 33.6 % (ref 12.0–46.0)
Lymphs Abs: 3 10*3/uL (ref 0.7–4.0)
MCHC: 32.9 g/dL (ref 30.0–36.0)
MCV: 90.3 fL (ref 78.0–100.0)
Monocytes Absolute: 0.5 10*3/uL (ref 0.1–1.0)
Monocytes Relative: 5.2 % (ref 3.0–12.0)
Neutro Abs: 5.2 10*3/uL (ref 1.4–7.7)
Neutrophils Relative %: 59.2 % (ref 43.0–77.0)
Platelets: 327 10*3/uL (ref 150.0–400.0)
RBC: 4.28 Mil/uL (ref 3.87–5.11)
RDW: 14.9 % (ref 11.5–15.5)
WBC: 8.8 10*3/uL (ref 4.0–10.5)

## 2023-09-28 NOTE — Progress Notes (Signed)
Please let patient know CXR showed resolved right sided pneumonia
